# Patient Record
Sex: Female | Born: 1945
Health system: Southern US, Community
[De-identification: ages and names within clinical notes are randomized; demographics above are authoritative.]

## PROBLEM LIST (undated history)

## (undated) DIAGNOSIS — R011 Cardiac murmur, unspecified: Secondary | ICD-10-CM

## (undated) DIAGNOSIS — I10 Essential (primary) hypertension: Secondary | ICD-10-CM

## (undated) DIAGNOSIS — E785 Hyperlipidemia, unspecified: Secondary | ICD-10-CM

## (undated) DIAGNOSIS — Z9289 Personal history of other medical treatment: Secondary | ICD-10-CM

## (undated) DIAGNOSIS — M1990 Unspecified osteoarthritis, unspecified site: Secondary | ICD-10-CM

## (undated) DIAGNOSIS — J45909 Unspecified asthma, uncomplicated: Secondary | ICD-10-CM

## (undated) DIAGNOSIS — T7840XA Allergy, unspecified, initial encounter: Secondary | ICD-10-CM

## (undated) DIAGNOSIS — R17 Unspecified jaundice: Secondary | ICD-10-CM

## (undated) HISTORY — DX: Unspecified osteoarthritis, unspecified site: M19.90

## (undated) HISTORY — DX: Unspecified asthma, uncomplicated: J45.909

## (undated) HISTORY — PX: PARTIAL HYSTERECTOMY: SHX80

## (undated) HISTORY — PX: VAGINAL HYSTERECTOMY: SUR661

## (undated) HISTORY — DX: Cardiac murmur, unspecified: R01.1

## (undated) HISTORY — DX: Personal history of other medical treatment: Z92.89

## (undated) HISTORY — DX: Hyperlipidemia, unspecified: E78.5

## (undated) HISTORY — DX: Unspecified jaundice: R17

## (undated) HISTORY — DX: Allergy, unspecified, initial encounter: T78.40XA

## (undated) HISTORY — DX: Essential (primary) hypertension: I10

---

## 1978-10-30 HISTORY — PX: TUBAL LIGATION: SHX77

## 2004-11-28 ENCOUNTER — Ambulatory Visit: Payer: Self-pay | Admitting: Internal Medicine

## 2008-07-12 ENCOUNTER — Emergency Department: Payer: Self-pay | Admitting: Emergency Medicine

## 2008-07-20 ENCOUNTER — Ambulatory Visit: Payer: Self-pay | Admitting: Family Medicine

## 2010-06-24 LAB — HM COLONOSCOPY: HM Colonoscopy: NORMAL

## 2011-10-05 ENCOUNTER — Ambulatory Visit (INDEPENDENT_AMBULATORY_CARE_PROVIDER_SITE_OTHER): Payer: Self-pay | Admitting: Internal Medicine

## 2011-10-05 ENCOUNTER — Encounter: Payer: Self-pay | Admitting: Internal Medicine

## 2011-10-05 VITALS — BP 170/88 | HR 80 | Temp 98.4°F | Ht 60.0 in | Wt 128.0 lb

## 2011-10-05 DIAGNOSIS — E782 Mixed hyperlipidemia: Secondary | ICD-10-CM | POA: Insufficient documentation

## 2011-10-05 DIAGNOSIS — E7800 Pure hypercholesterolemia, unspecified: Secondary | ICD-10-CM | POA: Insufficient documentation

## 2011-10-05 DIAGNOSIS — M25519 Pain in unspecified shoulder: Secondary | ICD-10-CM

## 2011-10-05 DIAGNOSIS — Z23 Encounter for immunization: Secondary | ICD-10-CM

## 2011-10-05 DIAGNOSIS — M25512 Pain in left shoulder: Secondary | ICD-10-CM

## 2011-10-05 DIAGNOSIS — E785 Hyperlipidemia, unspecified: Secondary | ICD-10-CM

## 2011-10-05 DIAGNOSIS — Z0000 Encounter for general adult medical examination without abnormal findings: Secondary | ICD-10-CM

## 2011-10-05 DIAGNOSIS — Z113 Encounter for screening for infections with a predominantly sexual mode of transmission: Secondary | ICD-10-CM

## 2011-10-05 LAB — CBC WITH DIFFERENTIAL/PLATELET
Basophils Relative: 0.6 % (ref 0.0–3.0)
Eosinophils Absolute: 0.1 10*3/uL (ref 0.0–0.7)
Lymphocytes Relative: 29.2 % (ref 12.0–46.0)
MCHC: 34 g/dL (ref 30.0–36.0)
MCV: 87.3 fl (ref 78.0–100.0)
Monocytes Absolute: 0.5 10*3/uL (ref 0.1–1.0)
Neutrophils Relative %: 60.3 % (ref 43.0–77.0)
Platelets: 274 10*3/uL (ref 150.0–400.0)
RBC: 4.73 Mil/uL (ref 3.87–5.11)
WBC: 5.8 10*3/uL (ref 4.5–10.5)

## 2011-10-05 LAB — COMPREHENSIVE METABOLIC PANEL
ALT: 28 U/L (ref 0–35)
AST: 24 U/L (ref 0–37)
BUN: 26 mg/dL — ABNORMAL HIGH (ref 6–23)
Creatinine, Ser: 0.5 mg/dL (ref 0.4–1.2)
GFR: 149.18 mL/min (ref >60.00)
Total Bilirubin: 0.6 mg/dL (ref 0.3–1.2)

## 2011-10-05 LAB — LIPID PANEL
HDL: 90.7 mg/dL (ref >39.00)
Triglycerides: 56 mg/dL (ref 0.0–149.0)
VLDL: 11.2 mg/dL (ref 0.0–40.0)

## 2011-10-05 MED ORDER — MELOXICAM 15 MG PO TABS: 15.0000 mg | ORAL_TABLET | Freq: Every day | ORAL | Status: DC

## 2011-10-05 MED ORDER — ZOSTER VACCINE LIVE 19400 UNT/0.65ML ~~LOC~~ SOLR: 0.6500 mL | Freq: Once | SUBCUTANEOUS | Status: DC

## 2011-10-05 NOTE — Patient Instructions (Signed)
Prevent and Reverse Heart Disease by [place] 

## 2011-10-05 NOTE — Progress Notes (Signed)
Subjective:    Patient ID: [name], female    DOB: 1946-02-06, 65 y.o.   MRN: [id number]  HPI 65 year old female with a history of hyperlipidemia presents to establish care. Her primary concern today is left shoulder pain. She notes this has been present for several weeks. She describes the pain as aching and tightness in her left shoulder. The pain is made worse by rotating and extending her arm behind her back. She has taken Advil with minimal improvement in her pain. She denies any weakness in her left arm or numbness. 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  She also brings records from labs performed in 2007. This shows elevated liver function tests. She reports that she has had lab work repeated since that time and her lipids have been persistently high. She has not taken medication for this. She tries to follow a healthy diet.  Outpatient Encounter Prescriptions as of 10/05/2011  Medication Sig Dispense Refill  . calcium carbonate (OS-CAL - DOSED IN MG OF ELEMENTAL CALCIUM) 1250 MG tablet Take 1 tablet by mouth daily.        . fexofenadine-pseudoephedrine (ALLEGRA-D 24) 180-240 MG per 24 hr tablet Take 1 tablet by mouth daily.        . fluticasone (FLONASE) 50 MCG/ACT nasal spray Place 2 sprays into the nose daily.          Review of Systems  Constitutional: Negative for fever, chills, appetite change, fatigue and unexpected weight change.  HENT: Negative for ear pain, congestion, neck pain and sinus pressure.   Eyes: Negative for visual disturbance.  Respiratory: Negative for cough, shortness of breath, wheezing and stridor.   Cardiovascular: Negative for chest pain, palpitations and leg swelling.  Gastrointestinal: Negative for nausea, abdominal pain, diarrhea, constipation and abdominal distention.  Genitourinary: Negative for dysuria and flank pain.  Musculoskeletal: Positive for arthralgias. Negative for myalgias and gait problem.  Skin: Negative for color change and  rash.  Neurological: Negative for dizziness, weakness, numbness and headaches.  Hematological: Negative for adenopathy. Does not bruise/bleed easily.  Psychiatric/Behavioral: Negative for suicidal ideas, sleep disturbance and dysphoric mood. The patient is not nervous/anxious.    BP 170/88  Pulse 80  Temp(Src) 98.4 F (36.9 C) (Oral)  Ht 5' (1.524 m)  Wt 128 lb (58.06 kg)  BMI 25.00 kg/m2  SpO2 97% Repeat BP 150/78    Objective:   Physical Exam  Constitutional: She is oriented to person, place, and time. She appears well-developed and well-nourished. No distress.  HENT:  Head: Normocephalic and atraumatic.  Right Ear: External ear normal.  Left Ear: External ear normal.  Nose: Nose normal.  Mouth/Throat: Oropharynx is clear and moist. No oropharyngeal exudate.  Eyes: Conjunctivae are normal. Pupils are equal, round, and reactive to light. Right eye exhibits no discharge. Left eye exhibits no discharge. No scleral icterus.  Neck: Normal range of motion. Neck supple. No tracheal deviation present. No thyromegaly present.  Cardiovascular: Normal rate, regular rhythm, normal heart sounds and intact distal pulses.  Exam reveals no gallop and no friction rub.   No murmur heard. Pulmonary/Chest: Effort normal and breath sounds normal. No respiratory distress. She has no wheezes. She has no rales. She exhibits no tenderness.  Musculoskeletal: She exhibits no edema and no tenderness.       Left shoulder: She exhibits decreased range of motion, tenderness and pain. She exhibits no deformity and normal strength.  Lymphadenopathy:    She has no cervical adenopathy.  Neurological: She  is alert and oriented to person, place, and time. No cranial nerve deficit. She exhibits normal muscle tone. Coordination normal.  Skin: Skin is warm and dry. No rash noted. She is not diaphoretic. No erythema. No pallor.  Psychiatric: She has a normal mood and affect. Her behavior is normal. Judgment and thought  content normal.          Assessment & Plan:  1. Left shoulder pain - Suspect adhesive capsulitis versus arthritis.  Will get plain film of left shoulder. Will start meloxicam. Follow up 1 month or sooner based on xray report.  2. Hyperlipidemia - Noted on previous labs. Not currently on medication. Will check lipids with labs today. Discussed diet high in fiber, low in saturated fat.  3. Hypertension - Noted on past exam per pt. Will check renal function with labs today. Follow up in 1 month for BP recheck. If persistently elevated, will start medication.  4. Health maintenance - Will give flu shot today. Tdap at next visit as not available today. Labs including CBC, CMP lipids today.

## 2011-10-06 LAB — HEPATITIS C ANTIBODY: HCV Ab: NEGATIVE

## 2011-10-09 ENCOUNTER — Telehealth: Payer: Self-pay | Admitting: *Deleted

## 2011-10-09 ENCOUNTER — Ambulatory Visit (INDEPENDENT_AMBULATORY_CARE_PROVIDER_SITE_OTHER)
Admission: RE | Admit: 2011-10-09 | Discharge: 2011-10-09 | Disposition: A | Discharge status: Home / Self Care | Payer: Self-pay | Source: Ambulatory Visit | Attending: Internal Medicine | Admitting: Internal Medicine

## 2011-10-09 DIAGNOSIS — M25512 Pain in left shoulder: Secondary | ICD-10-CM

## 2011-10-09 DIAGNOSIS — M25519 Pain in unspecified shoulder: Secondary | ICD-10-CM

## 2011-10-09 MED ORDER — ATORVASTATIN CALCIUM 20 MG PO TABS: 20.0000 mg | ORAL_TABLET | Freq: Every day | ORAL | Status: DC

## 2011-10-09 NOTE — Telephone Encounter (Signed)
Message copied by Vernie Murders on Mon Oct 09, 2011  9:13 AM ------      Message from: Ronna Polio A      Created: Thu Oct 05, 2011  5:30 PM       Labs show that cholesterol is high. Would recommend starting Lipitor 20mg  daily.  Repeat LFTS and lipids in 1 month.

## 2011-10-16 ENCOUNTER — Other Ambulatory Visit: Payer: Self-pay | Admitting: *Deleted

## 2011-10-16 MED ORDER — ZOSTER VACCINE LIVE 19400 UNT/0.65ML ~~LOC~~ SOLR: 0.6500 mL | Freq: Once | SUBCUTANEOUS | Status: AC

## 2011-11-13 ENCOUNTER — Encounter: Payer: Self-pay | Admitting: Internal Medicine

## 2011-11-13 ENCOUNTER — Ambulatory Visit (INDEPENDENT_AMBULATORY_CARE_PROVIDER_SITE_OTHER): Payer: Self-pay | Admitting: Internal Medicine

## 2011-11-13 ENCOUNTER — Other Ambulatory Visit: Payer: Self-pay

## 2011-11-13 VITALS — BP 156/86 | HR 71 | Temp 98.2°F | Wt 130.0 lb

## 2011-11-13 DIAGNOSIS — M25519 Pain in unspecified shoulder: Secondary | ICD-10-CM

## 2011-11-13 DIAGNOSIS — E785 Hyperlipidemia, unspecified: Secondary | ICD-10-CM

## 2011-11-13 DIAGNOSIS — G2581 Restless legs syndrome: Secondary | ICD-10-CM | POA: Insufficient documentation

## 2011-11-13 DIAGNOSIS — M25512 Pain in left shoulder: Secondary | ICD-10-CM

## 2011-11-13 LAB — COMPREHENSIVE METABOLIC PANEL
AST: 30 U/L (ref 0–37)
Albumin: 4.4 g/dL (ref 3.5–5.2)
Alkaline Phosphatase: 47 U/L (ref 39–117)
BUN: 17 mg/dL (ref 6–23)
Calcium: 8.9 mg/dL (ref 8.4–10.5)
Chloride: 100 mEq/L (ref 96–112)
Glucose, Bld: 116 mg/dL — ABNORMAL HIGH (ref 70–99)
Potassium: 3.6 mEq/L (ref 3.5–5.1)
Sodium: 135 mEq/L (ref 135–145)
Total Protein: 6.8 g/dL (ref 6.0–8.3)

## 2011-11-13 LAB — LIPID PANEL
LDL Cholesterol: 86 mg/dL (ref 0–99)
Total CHOL/HDL Ratio: 2
Triglycerides: 26 mg/dL (ref 0.0–149.0)

## 2011-11-13 MED ORDER — ROPINIROLE HCL 1 MG PO TABS: 1.0000 mg | ORAL_TABLET | Freq: Every day | ORAL | Status: DC

## 2011-11-13 NOTE — Progress Notes (Signed)
Subjective:    Patient ID: [name], female    DOB: January 26, 1946, 66 y.o.   MRN: [id number]  HPI 66YO female presents for follow up. Recently noted to have elevated lipids on labs. Started on Atorvastatin. No side effects of medication noted.  Has made changes to diet, limiting intake of meats and saturated fat, increasing fiber.    Also notes that left shoulder pain has improved significantly since last visit.  No weakness left arm, or with shoulder movement. No pain at rest. Pain described as aching only with extension of left arm posteriorly.Continues to use meloxicam on occasion with improvement.  Outpatient Encounter Prescriptions as of 11/13/2011  Medication Sig Dispense Refill  . atorvastatin (LIPITOR) 20 MG tablet Take 1 tablet (20 mg total) by mouth daily.  30 tablet  2  . fexofenadine-pseudoephedrine (ALLEGRA-D 24) 180-240 MG per 24 hr tablet Take 1 tablet by mouth daily.        . fluticasone (FLONASE) 50 MCG/ACT nasal spray Place 2 sprays into the nose daily.        . meloxicam (MOBIC) 15 MG tablet Take 1 tablet (15 mg total) by mouth daily.  30 tablet  2  . rOPINIRole (REQUIP) 1 MG tablet Take 1 tablet (1 mg total) by mouth at bedtime.  30 tablet  3  . DISCONTD: rOPINIRole (REQUIP) 1 MG tablet         Review of Systems  Constitutional: Negative for fever, chills, appetite change, fatigue and unexpected weight change.  HENT: Negative for ear pain, congestion, sore throat, trouble swallowing, neck pain, voice change and sinus pressure.   Eyes: Negative for visual disturbance.  Respiratory: Negative for cough, shortness of breath, wheezing and stridor.   Cardiovascular: Negative for chest pain, palpitations and leg swelling.  Gastrointestinal: Negative for nausea, vomiting, abdominal pain, diarrhea, constipation, blood in stool, abdominal distention and anal bleeding.  Genitourinary: Negative for dysuria and flank pain.  Musculoskeletal: Positive for arthralgias (left shoulder  - improved). Negative for myalgias and gait problem.  Skin: Negative for color change and rash.  Neurological: Negative for dizziness and headaches.  Hematological: Negative for adenopathy. Does not bruise/bleed easily.  Psychiatric/Behavioral: Negative for suicidal ideas, sleep disturbance and dysphoric mood. The patient is not nervous/anxious.    BP 156/86  Pulse 71  Temp(Src) 98.2 F (36.8 C) (Oral)  Wt 130 lb (58.968 kg)  SpO2 98%     Objective:   Physical Exam  Constitutional: She is oriented to person, place, and time. She appears well-developed and well-nourished. No distress.  HENT:  Head: Normocephalic and atraumatic.  Right Ear: External ear normal.  Left Ear: External ear normal.  Nose: Nose normal.  Mouth/Throat: Oropharynx is clear and moist. No oropharyngeal exudate.  Eyes: Conjunctivae are normal. Pupils are equal, round, and reactive to light. Right eye exhibits no discharge. Left eye exhibits no discharge. No scleral icterus.  Neck: Normal range of motion. Neck supple. No tracheal deviation present. No thyromegaly present.  Cardiovascular: Normal rate, regular rhythm, normal heart sounds and intact distal pulses.  Exam reveals no gallop and no friction rub.   No murmur heard. Pulmonary/Chest: Effort normal and breath sounds normal. No respiratory distress. She has no wheezes. She has no rales. She exhibits no tenderness.  Musculoskeletal: Normal range of motion. She exhibits no edema and no tenderness.  Lymphadenopathy:    She has no cervical adenopathy.  Neurological: She is alert and oriented to person, place, and time. No cranial nerve deficit. She  exhibits normal muscle tone. Coordination normal.  Skin: Skin is warm and dry. No rash noted. She is not diaphoretic. No erythema. No pallor.  Psychiatric: She has a normal mood and affect. Her behavior is normal. Judgment and thought content normal.          Assessment & Plan:  1. Hyperlipidemia - Started  Lipitor. No noted side effects. Will recheck LFTs and lipids today. Follow up 6 months.  2. Left shoulder pain - Improved. Using meloxicam rarely. Will continue to monitor. If symptoms do not resolve, will set up orthopedic evaluation.  3. Elevated BP without HTN - Pt BP elevated today. Has not been elevated at home, but was elevated last check here. Will monitor at home and pt will email with BP readings. If persistently >140/90, will add medication.

## 2011-12-11 ENCOUNTER — Ambulatory Visit: Payer: Self-pay | Admitting: Internal Medicine

## 2012-01-04 ENCOUNTER — Encounter: Payer: Self-pay | Admitting: Internal Medicine

## 2012-01-12 ENCOUNTER — Other Ambulatory Visit: Payer: Self-pay | Admitting: *Deleted

## 2012-01-12 MED ORDER — ATORVASTATIN CALCIUM 20 MG PO TABS: 20.0000 mg | ORAL_TABLET | Freq: Every day | ORAL | Status: DC

## 2012-05-20 ENCOUNTER — Ambulatory Visit: Payer: Self-pay | Admitting: Internal Medicine

## 2012-05-30 ENCOUNTER — Ambulatory Visit: Payer: Self-pay | Admitting: Internal Medicine

## 2012-06-24 ENCOUNTER — Ambulatory Visit (INDEPENDENT_AMBULATORY_CARE_PROVIDER_SITE_OTHER): Payer: 59 | Admitting: Internal Medicine

## 2012-06-24 ENCOUNTER — Encounter: Payer: Self-pay | Admitting: Internal Medicine

## 2012-06-24 ENCOUNTER — Other Ambulatory Visit: Payer: Self-pay | Admitting: Internal Medicine

## 2012-06-24 VITALS — BP 140/72 | HR 76 | Temp 98.2°F | Ht 60.0 in | Wt 131.5 lb

## 2012-06-24 DIAGNOSIS — E785 Hyperlipidemia, unspecified: Secondary | ICD-10-CM

## 2012-06-24 DIAGNOSIS — G2581 Restless legs syndrome: Secondary | ICD-10-CM

## 2012-06-24 DIAGNOSIS — J309 Allergic rhinitis, unspecified: Secondary | ICD-10-CM | POA: Insufficient documentation

## 2012-06-24 DIAGNOSIS — Z23 Encounter for immunization: Secondary | ICD-10-CM

## 2012-06-24 MED ORDER — FEXOFENADINE-PSEUDOEPHED ER 180-240 MG PO TB24: 1.0000 | ORAL_TABLET | Freq: Every day | ORAL | Status: DC

## 2012-06-24 MED ORDER — ROPINIROLE HCL 1 MG PO TABS: 1.0000 mg | ORAL_TABLET | Freq: Every day | ORAL | Status: DC

## 2012-06-24 NOTE — Progress Notes (Signed)
Subjective:    Patient ID: [name], female    DOB: Mar 17, 1946, 66 y.o.   MRN: [id number]  HPI 66 year old female with history of hyperlipidemia, allergic rhinitis, and restless legs presents for followup. She reports she is generally feeling well. Over the last few weeks she has noticed some increased nasal congestion despite use of Allegra-D. She questions whether she might need to followup with her ENT physician. She questions whether she may need allergy testing. She has not had any fever, chills, shortness of breath, cough. Next  In regards to hyperlipidemia, she reports she has only intermittently been taking her Lipitor. She does try to follow a healthy diet and exercise. She was concerned about the use of medications because she has a friend who was recently diagnosed with pancreatic cancer. She has not had side effects from statin medicine such as myalgia. Next  In regards to restless legs, she reports that she only intermittently uses Requip. She reports that symptoms are well controlled with this medication.  Outpatient Encounter Prescriptions as of 06/24/2012  Medication Sig Dispense Refill  . atorvastatin (LIPITOR) 20 MG tablet Take 1 tablet (20 mg total) by mouth daily.  90 tablet  1  . calcium carbonate (OS-CAL - DOSED IN MG OF ELEMENTAL CALCIUM) 1250 MG tablet Take 1 tablet by mouth daily.        . fexofenadine-pseudoephedrine (ALLEGRA-D 24) 180-240 MG per 24 hr tablet Take 1 tablet by mouth daily.  30 tablet  6  . fluticasone (FLONASE) 50 MCG/ACT nasal spray Place 2 sprays into the nose daily.        Marland Kitchen rOPINIRole (REQUIP) 1 MG tablet Take 1 tablet (1 mg total) by mouth at bedtime.  30 tablet  6    Review of Systems  Constitutional: Negative for fever, chills, appetite change, fatigue and unexpected weight change.  HENT: Positive for congestion. Negative for ear pain, sore throat, trouble swallowing, neck pain, voice change and sinus pressure.   Eyes: Negative for visual  disturbance.  Respiratory: Negative for cough, shortness of breath, wheezing and stridor.   Cardiovascular: Negative for chest pain, palpitations and leg swelling.  Gastrointestinal: Negative for nausea, vomiting, abdominal pain, diarrhea, constipation, blood in stool, abdominal distention and anal bleeding.  Genitourinary: Negative for dysuria and flank pain.  Musculoskeletal: Negative for myalgias, arthralgias and gait problem.  Skin: Negative for color change and rash.  Neurological: Negative for dizziness and headaches.  Hematological: Negative for adenopathy. Does not bruise/bleed easily.  Psychiatric/Behavioral: Negative for suicidal ideas, disturbed wake/sleep cycle and dysphoric mood. The patient is not nervous/anxious.    BP 150/88  Pulse 76  Temp 98.2 F (36.8 C) (Oral)  Ht 5' (1.524 m)  Wt 131 lb 8 oz (59.648 kg)  BMI 25.68 kg/m2  SpO2 98%     Objective:   Physical Exam  Constitutional: She is oriented to person, place, and time. She appears well-developed and well-nourished. No distress.  HENT:  Head: Normocephalic and atraumatic.  Right Ear: External ear normal.  Left Ear: External ear normal.  Nose: Nose normal.  Mouth/Throat: Oropharynx is clear and moist. No oropharyngeal exudate.  Eyes: Conjunctivae are normal. Pupils are equal, round, and reactive to light. Right eye exhibits no discharge. Left eye exhibits no discharge. No scleral icterus.  Neck: Normal range of motion. Neck supple. No tracheal deviation present. No thyromegaly present.  Cardiovascular: Normal rate, regular rhythm, normal heart sounds and intact distal pulses.  Exam reveals no gallop and no friction rub.  No murmur heard. Pulmonary/Chest: Effort normal and breath sounds normal. No respiratory distress. She has no wheezes. She has no rales. She exhibits no tenderness.  Musculoskeletal: Normal range of motion. She exhibits no edema and no tenderness.  Lymphadenopathy:    She has no cervical  adenopathy.  Neurological: She is alert and oriented to person, place, and time. No cranial nerve deficit. She exhibits normal muscle tone. Coordination normal.  Skin: Skin is warm and dry. No rash noted. She is not diaphoretic. No erythema. No pallor.  Psychiatric: She has a normal mood and affect. Her behavior is normal. Judgment and thought content normal.          Assessment & Plan:

## 2012-06-24 NOTE — Assessment & Plan Note (Signed)
Symptoms recently is well controlled on Allegra-D and Flonase. No evidence of acute sinusitis. Encouraged her to keep followup with allergist. Suspect she might benefit from allergy testing.

## 2012-06-24 NOTE — Assessment & Plan Note (Signed)
Symptoms well controlled with Requip. Will continue. Follow up 6 months.

## 2012-06-24 NOTE — Assessment & Plan Note (Signed)
Encouraged compliance with medication. Will check lipids and LFTs with labs today. Follow up 6 months.

## 2012-06-26 LAB — COMPREHENSIVE METABOLIC PANEL
Albumin/Globulin Ratio: 3.3 — ABNORMAL HIGH (ref 1.1–2.5)
Albumin: 5 g/dL — ABNORMAL HIGH (ref 3.6–4.8)
BUN: 22 mg/dL (ref 8–27)
Calcium: 9.3 mg/dL (ref 8.6–10.2)
Creatinine, Ser: 0.49 mg/dL — ABNORMAL LOW (ref 0.57–1.00)
GFR calc non Af Amer: 103 mL/min/{1.73_m2} (ref >59)
Globulin, Total: 1.5 g/dL (ref 1.5–4.5)
Glucose: 88 mg/dL (ref 65–99)
Total Protein: 6.5 g/dL (ref 6.0–8.5)

## 2012-06-26 LAB — LIPID PANEL W/O CHOL/HDL RATIO
HDL: 93 mg/dL (ref >39)
VLDL Cholesterol Cal: 10 mg/dL (ref 5–40)

## 2012-10-26 ENCOUNTER — Other Ambulatory Visit: Payer: Self-pay | Admitting: Internal Medicine

## 2012-12-14 ENCOUNTER — Other Ambulatory Visit: Payer: Self-pay

## 2012-12-23 ENCOUNTER — Encounter: Payer: 59 | Admitting: Internal Medicine

## 2013-01-01 ENCOUNTER — Encounter: Payer: 59 | Admitting: Internal Medicine

## 2013-02-24 ENCOUNTER — Ambulatory Visit (INDEPENDENT_AMBULATORY_CARE_PROVIDER_SITE_OTHER): Payer: 59 | Admitting: Internal Medicine

## 2013-02-24 ENCOUNTER — Encounter: Payer: Self-pay | Admitting: Internal Medicine

## 2013-02-24 ENCOUNTER — Other Ambulatory Visit (HOSPITAL_COMMUNITY)
Admission: RE | Admit: 2013-02-24 | Discharge: 2013-02-24 | Disposition: A | Discharge status: Home / Self Care | Payer: 59 | Source: Ambulatory Visit | Attending: Internal Medicine | Admitting: Internal Medicine

## 2013-02-24 VITALS — BP 159/97 | HR 74 | Temp 98.7°F | Ht 59.5 in | Wt 131.0 lb

## 2013-02-24 DIAGNOSIS — M21619 Bunion of unspecified foot: Secondary | ICD-10-CM

## 2013-02-24 DIAGNOSIS — Z01419 Encounter for gynecological examination (general) (routine) without abnormal findings: Secondary | ICD-10-CM | POA: Insufficient documentation

## 2013-02-24 DIAGNOSIS — M21611 Bunion of right foot: Secondary | ICD-10-CM | POA: Insufficient documentation

## 2013-02-24 DIAGNOSIS — IMO0001 Reserved for inherently not codable concepts without codable children: Secondary | ICD-10-CM | POA: Insufficient documentation

## 2013-02-24 DIAGNOSIS — R030 Elevated blood-pressure reading, without diagnosis of hypertension: Secondary | ICD-10-CM

## 2013-02-24 DIAGNOSIS — E785 Hyperlipidemia, unspecified: Secondary | ICD-10-CM

## 2013-02-24 DIAGNOSIS — J309 Allergic rhinitis, unspecified: Secondary | ICD-10-CM

## 2013-02-24 DIAGNOSIS — Z0000 Encounter for general adult medical examination without abnormal findings: Secondary | ICD-10-CM | POA: Insufficient documentation

## 2013-02-24 DIAGNOSIS — Z23 Encounter for immunization: Secondary | ICD-10-CM

## 2013-02-24 DIAGNOSIS — M21612 Bunion of left foot: Secondary | ICD-10-CM

## 2013-02-24 DIAGNOSIS — Z1151 Encounter for screening for human papillomavirus (HPV): Secondary | ICD-10-CM | POA: Insufficient documentation

## 2013-02-24 DIAGNOSIS — H04123 Dry eye syndrome of bilateral lacrimal glands: Secondary | ICD-10-CM

## 2013-02-24 DIAGNOSIS — R9431 Abnormal electrocardiogram [ECG] [EKG]: Secondary | ICD-10-CM | POA: Insufficient documentation

## 2013-02-24 DIAGNOSIS — H04129 Dry eye syndrome of unspecified lacrimal gland: Secondary | ICD-10-CM | POA: Insufficient documentation

## 2013-02-24 LAB — COMPREHENSIVE METABOLIC PANEL
Albumin: 4.3 g/dL (ref 3.5–5.2)
Alkaline Phosphatase: 45 U/L (ref 39–117)
CO2: 31 mEq/L (ref 19–32)
Chloride: 101 mEq/L (ref 96–112)
GFR: 128.56 mL/min (ref >60.00)
Glucose, Bld: 95 mg/dL (ref 70–99)
Potassium: 4.4 mEq/L (ref 3.5–5.1)
Sodium: 137 mEq/L (ref 135–145)
Total Protein: 6.8 g/dL (ref 6.0–8.3)

## 2013-02-24 LAB — CBC WITH DIFFERENTIAL/PLATELET
Eosinophils Relative: 1.1 % (ref 0.0–5.0)
MCV: 86.3 fl (ref 78.0–100.0)
Monocytes Absolute: 0.4 10*3/uL (ref 0.1–1.0)
Neutrophils Relative %: 69.3 % (ref 43.0–77.0)
Platelets: 259 10*3/uL (ref 150.0–400.0)
WBC: 5.9 10*3/uL (ref 4.5–10.5)

## 2013-02-24 LAB — LIPID PANEL
Cholesterol: 242 mg/dL — ABNORMAL HIGH (ref 0–200)
Total CHOL/HDL Ratio: 3
Triglycerides: 42 mg/dL (ref 0.0–149.0)

## 2013-02-24 MED ORDER — ZOSTER VACCINE LIVE 19400 UNT/0.65ML ~~LOC~~ SOLR: 0.6500 mL | Freq: Once | SUBCUTANEOUS | Status: DC

## 2013-02-24 NOTE — Assessment & Plan Note (Signed)
Encouraged her to limit use of Sudafed given that it may raise her blood pressure. Continue Allegra.

## 2013-02-24 NOTE — Assessment & Plan Note (Signed)
Non-specific findings on EKG. Asymptomatic. Strong family hx of heart disease in both parents at young age. Risk factors hyperlipidemia, sedentary lifestyle. Will set up cardiology evaluation. Question if stress test might be helpful for risk stratification.

## 2013-02-24 NOTE — Assessment & Plan Note (Signed)
BP slightly elevated today. Likely related to use of Sudafed. Encouraged her to avoid this medication. Will monitor BP at home and call if BP persistently >150/90.

## 2013-02-24 NOTE — Assessment & Plan Note (Signed)
Question if she might benefit from restasis for chronic dry eyes. Encouraged her to follow up with her ophthalmologist about this.

## 2013-02-24 NOTE — Progress Notes (Signed)
Subjective:    Patient ID: [name], female    DOB: [birth year], 67 y.o.   MRN: [id number]  HPI The patient is here for annual Medicare wellness examination and management of other chronic and acute problems.   The risk factors are reflected in the social history.  The roster of all physicians providing medical care to patient - is listed in the Snapshot section of the chart.  Activities of daily living:  The patient is 100% independent in all ADLs: dressing, toileting, feeding as well as independent mobility  Home safety : The patient has smoke detectors in the home. They wear seatbelts.  There are no firearms at home. There is no violence in the home.   There is no risks for hepatitis, STDs or HIV. There is no history of blood transfusion. They have no travel history to infectious disease endemic areas of the world.  The patient has seen their dentist in the last six month. (Dr. Ashley Royalty) They have seen their eye doctor in the last year. [facility]) No issues with hearing.  They have deferred audiologic testing in the last year.   They do not  have excessive sun exposure. Discussed the need for sun protection: hats, long sleeves and use of sunscreen if there is significant sun exposure. (Dr. Gwen Pounds)  Diet: the importance of a healthy diet is discussed. They do have a healthy diet.  The benefits of regular aerobic exercise were discussed. She does not exercise, but very active at work.  Depression screen: there are no signs or vegative symptoms of depression- irritability, change in appetite, anhedonia, sadness/tearfullness.  Cognitive assessment: the patient manages all their financial and personal affairs and is actively engaged. They could relate day,date,year and events.  HCPOA - husband, then son  The following portions of the patient's history were reviewed and updated as appropriate: allergies, current medications, past family history, past medical history,   past surgical history, past social history  and problem list.  Visual acuity was not assessed per patient preference since she has regular follow up with her ophthalmologist. Hearing and body mass index were assessed and reviewed.   During the course of the visit the patient was educated and counseled about appropriate screening and preventive services including : fall prevention , diabetes screening, nutrition counseling, colorectal cancer screening, and recommended immunizations.    Patient is also concerned today about several month history of clear drainage from her eyes. She was seen in the past by her ophthalmologist for this and he recommended repeat voiding drops for suspected dry eyes. She reports no improvement with this. She denies any purulent drainage, eye pain, visual changes. Symptoms occasionally improved with use of antihistamines.  She is also concerned about bunion on her right foot. This is been present for years. It is occasionally painful after prolonged standing. It is improved by wearing supportive shoes. She questions if any other interventions might be helpful aside from surgery.  Outpatient Encounter Prescriptions as of 02/24/2013  Medication Sig Dispense Refill  . fexofenadine-pseudoephedrine (ALLEGRA-D 24) 180-240 MG per 24 hr tablet Take 1 tablet by mouth daily.  30 tablet  6  . fluticasone (FLONASE) 50 MCG/ACT nasal spray Place 2 sprays into the nose daily.        Marland Kitchen rOPINIRole (REQUIP) 1 MG tablet TAKE 1 TABLET BY MOUTH AT BEDTIME  30 tablet  6  . atorvastatin (LIPITOR) 20 MG tablet Take 1 tablet (20 mg total) by mouth daily.  90 tablet  1  . diazepam (VALIUM) 5 MG tablet Take 5 mg by mouth.       . zoster vaccine live, PF, (ZOSTAVAX) 16109 UNT/0.65ML injection Inject 19,400 Units into the skin once.  1 each  0  . [DISCONTINUED] calcium carbonate (OS-CAL - DOSED IN MG OF ELEMENTAL CALCIUM) 1250 MG tablet Take 1 tablet by mouth daily.         No facility-administered  encounter medications on file as of 02/24/2013.   BP 159/97  Pulse 74  Temp(Src) 98.7 F (37.1 C) (Oral)  Ht 4' 11.5" (1.511 m)  Wt 131 lb (59.421 kg)  BMI 26.03 kg/m2  SpO2 97%   Review of Systems  Constitutional: Negative for fever, chills, appetite change, fatigue and unexpected weight change.  HENT: Negative for ear pain, congestion, sore throat, trouble swallowing, neck pain, voice change and sinus pressure.   Eyes: Positive for discharge. Negative for visual disturbance.  Respiratory: Negative for cough, shortness of breath, wheezing and stridor.   Cardiovascular: Negative for chest pain, palpitations and leg swelling.  Gastrointestinal: Negative for nausea, vomiting, abdominal pain, diarrhea, constipation, blood in stool, abdominal distention and anal bleeding.  Genitourinary: Negative for dysuria and flank pain.  Musculoskeletal: Negative for myalgias, arthralgias and gait problem.  Skin: Negative for color change and rash.  Neurological: Negative for dizziness and headaches.  Hematological: Negative for adenopathy. Does not bruise/bleed easily.  Psychiatric/Behavioral: Negative for suicidal ideas, sleep disturbance and dysphoric mood. The patient is not nervous/anxious.        Objective:   Physical Exam  Constitutional: She is oriented to person, place, and time. She appears well-developed and well-nourished. No distress.  HENT:  Head: Normocephalic and atraumatic.  Right Ear: External ear normal.  Left Ear: External ear normal.  Nose: Nose normal.  Mouth/Throat: Oropharynx is clear and moist. No oropharyngeal exudate.  Eyes: Conjunctivae are normal. Pupils are equal, round, and reactive to light. Right eye exhibits no discharge. Left eye exhibits no discharge. No scleral icterus.  Neck: Normal range of motion. Neck supple. No tracheal deviation present. No thyromegaly present.  Cardiovascular: Normal rate, regular rhythm, normal heart sounds and intact distal pulses.   Exam reveals no gallop and no friction rub.   No murmur heard. Pulmonary/Chest: Effort normal and breath sounds normal. No accessory muscle usage. Not tachypneic. No respiratory distress. She has no decreased breath sounds. She has no wheezes. She has no rhonchi. She has no rales. She exhibits no tenderness.  Abdominal: Soft. Bowel sounds are normal. She exhibits no distension and no mass. There is no tenderness. There is no rebound and no guarding.  Genitourinary: Rectum normal, vagina normal and uterus normal. No breast swelling, tenderness, discharge or bleeding. Pelvic exam was performed with patient supine. There is no rash, tenderness or lesion on the right labia. There is no rash, tenderness or lesion on the left labia. Uterus is not enlarged and not tender. Cervix exhibits no motion tenderness, no discharge and no friability. Right adnexum displays no mass, no tenderness and no fullness. Left adnexum displays no mass, no tenderness and no fullness. No erythema or tenderness around the vagina. No vaginal discharge found.  Musculoskeletal: Normal range of motion. She exhibits no edema and no tenderness.  Lymphadenopathy:    She has no cervical adenopathy.  Neurological: She is alert and oriented to person, place, and time. No cranial nerve deficit. She exhibits normal muscle tone. Coordination normal.  Skin: Skin is warm and dry. No rash noted. She  is not diaphoretic. No erythema. No pallor.  Psychiatric: She has a normal mood and affect. Her behavior is normal. Judgment and thought content normal.          Assessment & Plan:

## 2013-02-24 NOTE — Assessment & Plan Note (Signed)
General medical exam including breast and pelvic exam normal today. Pap is pending. Mammogram scheduled. Colonoscopy is up-to-date. Appropriate screening performed. Labs today including CMP, lipids, CBC. Encouraged healthy diet and regular exercise. Follow up 6 months and prn.

## 2013-02-24 NOTE — Assessment & Plan Note (Signed)
Encouraged continued use of supportive shoes. Discussed referral to podiatry. Pt would like to hold off for now.

## 2013-02-24 NOTE — Assessment & Plan Note (Signed)
Patient stopped taking her atorvastatin. She would like to control cholesterol through diet. Discussed a Mediterranean style diet and recommended at least 40 minutes of exercise 3 times per week. Will repeat lipid profile with labs today.

## 2013-02-26 LAB — HM PAP SMEAR: HM PAP: NEGATIVE

## 2013-03-10 ENCOUNTER — Telehealth: Payer: Self-pay | Admitting: Internal Medicine

## 2013-03-10 NOTE — Telephone Encounter (Signed)
Mammogram from 03/10/2013 report states they need to get additional views because of dense breast tissue. Has this been scheduled?

## 2013-03-11 NOTE — Telephone Encounter (Signed)
Left message to call back  

## 2013-03-12 NOTE — Telephone Encounter (Signed)
Patient called back and she has already been scheduled, she is there right now

## 2013-03-21 ENCOUNTER — Encounter: Payer: Self-pay | Admitting: Cardiovascular Disease

## 2013-03-21 ENCOUNTER — Ambulatory Visit (INDEPENDENT_AMBULATORY_CARE_PROVIDER_SITE_OTHER): Payer: 59 | Admitting: Cardiovascular Disease

## 2013-03-21 VITALS — BP 172/103 | HR 81 | Ht 60.0 in | Wt 129.0 lb

## 2013-03-21 DIAGNOSIS — E785 Hyperlipidemia, unspecified: Secondary | ICD-10-CM

## 2013-03-21 DIAGNOSIS — R0600 Dyspnea, unspecified: Secondary | ICD-10-CM | POA: Insufficient documentation

## 2013-03-21 DIAGNOSIS — I10 Essential (primary) hypertension: Secondary | ICD-10-CM

## 2013-03-21 DIAGNOSIS — R000 Tachycardia, unspecified: Secondary | ICD-10-CM

## 2013-03-21 MED ORDER — AMLODIPINE BESYLATE 5 MG PO TABS: 5.0000 mg | ORAL_TABLET | Freq: Every day | ORAL | Status: DC

## 2013-03-21 NOTE — Progress Notes (Signed)
HPI  [name] who was referred by Dr. Dan Schaefer for an abnormal ECG and family history of premature coronary artery disease. The patient has no previous cardiac history. She has no history of diabetes. She does have history of mild hyperlipidemia and was started on atorvastatin. However, she has not been taking this medication due to fear of side effects. She also has been noted on more than one occasion to be hypertensive but is not on treatment. She denies any chest discomfort. She does not exercise on a regular basis. She does get mild shortness of breath if he overexerts himself. There is no dizziness, palpitations or syncope. She is a lifelong nonsmoker. There is a strong family history of premature coronary artery disease.  Allergies  Allergen Reactions  . Codeine Nausea Only  . Penicillins Swelling and Rash     Current Outpatient Prescriptions on File Prior to Visit  Medication Sig Dispense Refill  . fexofenadine-pseudoephedrine (ALLEGRA-D 24) 180-240 MG per 24 hr tablet Take 1 tablet by mouth daily.  30 tablet  6  . fluticasone (FLONASE) 50 MCG/ACT nasal spray Place 2 sprays into the nose daily.        Marland Kitchen rOPINIRole (REQUIP) 1 MG tablet TAKE 1 TABLET BY MOUTH AT BEDTIME  30 tablet  6  . zoster vaccine live, PF, (ZOSTAVAX) 16109 UNT/0.65ML injection Inject 19,400 Units into the skin once.  1 each  0   No current facility-administered medications on file prior to visit.     Past Medical History  Diagnosis Date  . Jaundice     In High school  . H/O exercise stress test     at 82 years age  . Hyperlipidemia   . Heart murmur   . Asthma   . Jaundice     hx   . Hypertension      Past Surgical History  Procedure Laterality Date  . Tubal ligation  1980     Family History  Problem Relation Age of Onset  . Heart attack Mother   . Heart disease Mother 66  . Heart attack Father   . Heart disease Father 97  . Diabetes Sister   . Diabetes  Maternal Grandmother   . Colon cancer Neg Hx   . Breast cancer Other   . Heart disease Brother      History   Social History  . Marital Status: Married    Spouse Name: N/A    Number of Children: 3  . Years of Education: N/A   Occupational History  . Self Employed - Hairdresser    Social History Main Topics  . Smoking status: Never Smoker   . Smokeless tobacco: Never Used  . Alcohol Use: Yes     Comment: Wine nightly  . Drug Use: No  . Sexually Active: Not on file   Other Topics Concern  . Not on file   Social History Narrative   Lives in [place]. Works at [employer] in [employer]      Daily Caffeine Use:  2 coffee   Regular Exercise -  NO              ROS Constitutional: Negative for fever, chills, diaphoresis, activity change, appetite change and fatigue.  HENT: Negative for hearing loss, nosebleeds, congestion, sore throat, facial swelling, drooling, trouble swallowing, neck pain, voice change, sinus pressure and tinnitus.  Eyes: Negative for photophobia, pain, discharge and visual disturbance.  Respiratory: Negative for apnea, cough,  chest tightness and wheezing.  Cardiovascular: Negative for chest pain, palpitations and leg swelling.  Gastrointestinal: Negative for nausea, vomiting, abdominal pain, diarrhea, constipation, blood in stool and abdominal distention.  Genitourinary: Negative for dysuria, urgency, frequency, hematuria and decreased urine volume.  Musculoskeletal: Negative for myalgias, back pain, joint swelling, arthralgias and gait problem.  Skin: Negative for color change, pallor, rash and wound.  Neurological: Negative for dizziness, tremors, seizures, syncope, speech difficulty, weakness, light-headedness, numbness and headaches.  Psychiatric/Behavioral: Negative for suicidal ideas, hallucinations, behavioral problems and agitation. The patient is not nervous/anxious.     PHYSICAL EXAM   BP 172/103  Pulse 81  Ht 5' (1.524 m)  Wt 129 lb  (58.514 kg)  BMI 25.19 kg/m2 Constitutional: She is oriented to person, place, and time. She appears well-developed and well-nourished. No distress.  HENT: No nasal discharge.  Head: Normocephalic and atraumatic.  Eyes: Pupils are equal and round. Right eye exhibits no discharge. Left eye exhibits no discharge.  Neck: Normal range of motion. Neck supple. No JVD present. No thyromegaly present.  Cardiovascular: Normal rate, regular rhythm, normal heart sounds. Exam reveals no gallop and no friction rub. No murmur heard.  Pulmonary/Chest: Effort normal and breath sounds normal. No stridor. No respiratory distress. She has no wheezes. She has no rales. She exhibits no tenderness.  Abdominal: Soft. Bowel sounds are normal. She exhibits no distension. There is no tenderness. There is no rebound and no guarding.  Musculoskeletal: Normal range of motion. She exhibits no edema and no tenderness.  Neurological: She is alert and oriented to person, place, and time. Coordination normal.  Skin: Skin is warm and dry. No rash noted. She is not diaphoretic. No erythema. No pallor.  Psychiatric: She has a normal mood and affect. Her behavior is normal. Judgment and thought content normal.     RUE:AVWUJ  Rhythm  -Left axis -anterior fascicular block.   ABNORMAL    ASSESSMENT AND PLAN

## 2013-03-21 NOTE — Assessment & Plan Note (Signed)
She likely has hypertension as she has been noted to have elevated blood pressure readings on more than one occasion. Due to that, I started her on amlodipine 5 mg once daily. I want her blood pressure to be controlled before her stress test. I also discussed with him the importance of decreasing sodium intake and regular exercise.

## 2013-03-21 NOTE — Assessment & Plan Note (Signed)
The patient has mild exertional dyspnea without associated chest pain. Risk factors for coronary artery disease include age, family history, hyperlipidemia and hypertension. Due to that, I recommend further evaluation with a treadmill nuclear stress test. A treadmill stress test along is likely not sufficient due to an abnormal EKG with left anterior fascicular block. I discussed with the patient the importance of lifestyle changes in order to decrease the chance of future coronary artery disease and cardiovascular events. We discussed the importance of controlling risk factors, healthy diet as well as regular exercise. I also explained to him that a normal stress test does not rule out atherosclerosis.

## 2013-03-21 NOTE — Patient Instructions (Addendum)
Start Amlodipine 5 mg once daily for high blood pressure.  Decrease salt intake in your diet.   Your physician has requested that you have en exercise stress myoview. For further information please visit [email or web address] Please follow instruction sheet, as given.  Follow up as needed.

## 2013-03-21 NOTE — Assessment & Plan Note (Signed)
Lab Results  Component Value Date   CHOL 242* 02/24/2013   HDL 93.60 02/24/2013   LDLCALC 152* 06/24/2012   LDLDIRECT 158.6 02/24/2013   TRIG 42.0 02/24/2013   CHOLHDL 3 02/24/2013   The patient has significantly elevated LDL. However, her HDL is also elevated which should be protective. Her 10 year risk of atherosclerotic cardiovascular disease is 7.5% which is slightly elevated. The patient prefers not to be on medication at this point and has not taken atorvastatin. Risks and benefits were discussed with her. She is going to try with lifestyle changes and diet.

## 2013-03-31 ENCOUNTER — Ambulatory Visit: Payer: Self-pay | Admitting: Cardiovascular Disease

## 2013-03-31 DIAGNOSIS — R079 Chest pain, unspecified: Secondary | ICD-10-CM

## 2013-04-01 ENCOUNTER — Other Ambulatory Visit: Payer: Self-pay

## 2013-04-01 DIAGNOSIS — R0600 Dyspnea, unspecified: Secondary | ICD-10-CM

## 2013-04-01 NOTE — Progress Notes (Signed)
Pt informed of stress test result. 

## 2013-06-16 ENCOUNTER — Other Ambulatory Visit: Payer: Self-pay | Admitting: Internal Medicine

## 2013-06-29 LAB — HM MAMMOGRAPHY: HM MAMMO: NORMAL

## 2013-09-04 ENCOUNTER — Other Ambulatory Visit: Payer: Self-pay

## 2013-09-13 ENCOUNTER — Other Ambulatory Visit: Payer: Self-pay | Admitting: Internal Medicine

## 2013-09-15 NOTE — Telephone Encounter (Signed)
Eprescribed.

## 2013-09-16 ENCOUNTER — Telehealth: Payer: Self-pay | Admitting: *Deleted

## 2013-09-16 NOTE — Telephone Encounter (Signed)
Ok to send in 3 month supply? Last seen 01/2013 and has no upcoming appointment scheduled.

## 2013-09-16 NOTE — Telephone Encounter (Signed)
Fine to send in a 3 month supply, but needs to schedule follow up

## 2013-09-16 NOTE — Telephone Encounter (Signed)
Pharmacy Note:  Ropinirole Hcl 1 mg tab  Fluticasone Propiona 50 mcg/inh nose AER  Atorvstatin Calcium 20mg  tab   Pt would like a 3 month supply?

## 2013-09-17 NOTE — Addendum Note (Signed)
Addended by: Theola Sequin on: 09/17/2013 02:48 PM   Modules accepted: Orders

## 2013-09-18 MED ORDER — ATORVASTATIN CALCIUM 20 MG PO TABS: ORAL_TABLET | ORAL | Status: DC

## 2013-09-18 MED ORDER — FLUTICASONE PROPIONATE 50 MCG/ACT NA SUSP: NASAL | Status: DC

## 2013-09-18 MED ORDER — ROPINIROLE HCL 1 MG PO TABS: ORAL_TABLET | ORAL | Status: DC

## 2013-09-18 NOTE — Telephone Encounter (Signed)
Rx sent to pharmacy on file with notation to call office to schedule an appointment.

## 2013-09-18 NOTE — Addendum Note (Signed)
Addended by: Theola Sequin on: 09/18/2013 02:23 PM   Modules accepted: Orders

## 2014-02-14 ENCOUNTER — Other Ambulatory Visit: Payer: Self-pay | Admitting: Cardiovascular Disease

## 2014-02-16 ENCOUNTER — Other Ambulatory Visit: Payer: Self-pay | Admitting: *Deleted

## 2014-02-16 MED ORDER — AMLODIPINE BESYLATE 5 MG PO TABS: ORAL_TABLET | ORAL | Status: DC

## 2014-02-16 NOTE — Telephone Encounter (Signed)
Requested Prescriptions   Signed Prescriptions Disp Refills  . amLODipine (NORVASC) 5 MG tablet 30 tablet 3    Sig: TAKE 1 TABLET EVERY DAY    Authorizing Provider: Kathlyn Sacramento A    Ordering User: Britt Bottom

## 2014-05-18 ENCOUNTER — Encounter: Payer: Medicare Other | Admitting: Internal Medicine

## 2014-06-29 ENCOUNTER — Ambulatory Visit (INDEPENDENT_AMBULATORY_CARE_PROVIDER_SITE_OTHER): Payer: Medicare PPO | Admitting: Internal Medicine

## 2014-06-29 ENCOUNTER — Encounter: Payer: Self-pay | Admitting: Internal Medicine

## 2014-06-29 VITALS — BP 130/80 | HR 65 | Temp 98.1°F | Ht 59.5 in | Wt 128.2 lb

## 2014-06-29 DIAGNOSIS — Z23 Encounter for immunization: Secondary | ICD-10-CM

## 2014-06-29 DIAGNOSIS — Z0000 Encounter for general adult medical examination without abnormal findings: Secondary | ICD-10-CM

## 2014-06-29 DIAGNOSIS — I10 Essential (primary) hypertension: Secondary | ICD-10-CM

## 2014-06-29 DIAGNOSIS — Z7184 Encounter for health counseling related to travel: Secondary | ICD-10-CM

## 2014-06-29 LAB — COMPREHENSIVE METABOLIC PANEL
ALBUMIN: 4.4 g/dL (ref 3.5–5.2)
ALT: 37 U/L — ABNORMAL HIGH (ref 0–35)
AST: 39 U/L — ABNORMAL HIGH (ref 0–37)
Alkaline Phosphatase: 47 U/L (ref 39–117)
BUN: 22 mg/dL (ref 6–23)
CALCIUM: 9.7 mg/dL (ref 8.4–10.5)
CO2: 26 mEq/L (ref 19–32)
Chloride: 104 mEq/L (ref 96–112)
Creatinine, Ser: 0.6 mg/dL (ref 0.4–1.2)
GFR: 106.13 mL/min (ref >60.00)
GLUCOSE: 100 mg/dL — AB (ref 70–99)
POTASSIUM: 4.2 meq/L (ref 3.5–5.1)
SODIUM: 141 meq/L (ref 135–145)
Total Bilirubin: 0.9 mg/dL (ref 0.2–1.2)
Total Protein: 7.4 g/dL (ref 6.0–8.3)

## 2014-06-29 LAB — MICROALBUMIN / CREATININE URINE RATIO
CREATININE, U: 180.2 mg/dL
Microalb Creat Ratio: 0.7 mg/g (ref 0.0–30.0)
Microalb, Ur: 1.2 mg/dL (ref 0.0–1.9)

## 2014-06-29 LAB — LIPID PANEL
CHOLESTEROL: 226 mg/dL — AB (ref 0–200)
HDL: 99.7 mg/dL (ref >39.00)
LDL Cholesterol: 111 mg/dL — ABNORMAL HIGH (ref 0–99)
NonHDL: 126.3
Total CHOL/HDL Ratio: 2
Triglycerides: 77 mg/dL (ref 0.0–149.0)
VLDL: 15.4 mg/dL (ref 0.0–40.0)

## 2014-06-29 LAB — CBC WITH DIFFERENTIAL/PLATELET
Basophils Absolute: 0 10*3/uL (ref 0.0–0.1)
Basophils Relative: 0.6 % (ref 0.0–3.0)
Eosinophils Absolute: 0.2 10*3/uL (ref 0.0–0.7)
Eosinophils Relative: 3.7 % (ref 0.0–5.0)
HEMATOCRIT: 42.8 % (ref 36.0–46.0)
Hemoglobin: 14.4 g/dL (ref 12.0–15.0)
Lymphocytes Relative: 23.5 % (ref 12.0–46.0)
Lymphs Abs: 1.4 10*3/uL (ref 0.7–4.0)
MCHC: 33.8 g/dL (ref 30.0–36.0)
MCV: 87 fl (ref 78.0–100.0)
Monocytes Absolute: 0.5 10*3/uL (ref 0.1–1.0)
Monocytes Relative: 7.9 % (ref 3.0–12.0)
Neutro Abs: 3.8 10*3/uL (ref 1.4–7.7)
Neutrophils Relative %: 64.3 % (ref 43.0–77.0)
PLATELETS: 302 10*3/uL (ref 150.0–400.0)
RBC: 4.91 Mil/uL (ref 3.87–5.11)
RDW: 13.4 % (ref 11.5–15.5)
WBC: 5.9 10*3/uL (ref 4.0–10.5)

## 2014-06-29 LAB — VITAMIN D 25 HYDROXY (VIT D DEFICIENCY, FRACTURES): VITD: 37.63 ng/mL (ref 30.00–100.00)

## 2014-06-29 MED ORDER — ROPINIROLE HCL 1 MG PO TABS: ORAL_TABLET | ORAL | Status: DC

## 2014-06-29 MED ORDER — AMLODIPINE BESYLATE 5 MG PO TABS: ORAL_TABLET | ORAL | Status: DC

## 2014-06-29 MED ORDER — ATORVASTATIN CALCIUM 20 MG PO TABS: ORAL_TABLET | ORAL | Status: DC

## 2014-06-29 MED ORDER — ALPRAZOLAM 0.25 MG PO TABS: 0.2500 mg | ORAL_TABLET | Freq: Two times a day (BID) | ORAL | Status: DC | PRN

## 2014-06-29 MED ORDER — CIPROFLOXACIN HCL 500 MG PO TABS: 500.0000 mg | ORAL_TABLET | Freq: Two times a day (BID) | ORAL | Status: DC

## 2014-06-29 NOTE — Patient Instructions (Signed)

## 2014-06-29 NOTE — Assessment & Plan Note (Signed)
Discussed upcoming trip to [place]. Will set up referral to ID for discussion of malaria prophylaxis and vaccinations. Cipro given for Traveler's diarrhea. Xanax prn for anxiety during flight.
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
## 2014-06-29 NOTE — Assessment & Plan Note (Signed)
General medical exam normal today including breast exam. PAP and pelvic deferred as normal, HPV neg in 2014. Mammogram ordered. Colonoscopy UTD. Labs today including CBC, CMP, lipids. ENcouraged healthy diet and exercise.

## 2014-06-29 NOTE — Progress Notes (Signed)
Pre visit review using our clinic review tool, if applicable. No additional management support is needed unless otherwise documented below in the visit note. 

## 2014-07-08 ENCOUNTER — Other Ambulatory Visit: Payer: Self-pay | Admitting: *Deleted

## 2014-07-08 ENCOUNTER — Telehealth: Payer: Self-pay

## 2014-07-08 DIAGNOSIS — Z7184 Encounter for health counseling related to travel: Secondary | ICD-10-CM

## 2014-07-08 MED ORDER — ALPRAZOLAM 0.25 MG PO TABS: 0.2500 mg | ORAL_TABLET | Freq: Two times a day (BID) | ORAL | Status: DC | PRN

## 2014-07-08 NOTE — Telephone Encounter (Signed)
The patient called and is hoping to find out how she can get her xananx rx.  She wasn't sure if this was a medication she needed to pick up, or if she could have it called in. Thanks!

## 2014-07-08 NOTE — Telephone Encounter (Signed)
Printed Rx.

## 2014-07-08 NOTE — Telephone Encounter (Signed)
Fine to fill xanax

## 2014-07-09 NOTE — Telephone Encounter (Signed)
Rx faxed

## 2014-07-20 ENCOUNTER — Telehealth: Payer: Self-pay | Admitting: Internal Medicine

## 2014-07-20 ENCOUNTER — Other Ambulatory Visit: Payer: Self-pay | Admitting: Internal Medicine

## 2014-07-20 NOTE — Telephone Encounter (Signed)
I sent her a Mychart message about this a while back. Her labs were normal except for mild elevation of LFTs. I recommended follow up in 3 months.

## 2014-07-20 NOTE — Telephone Encounter (Signed)
Please call pt with lab results that she had on 06/29/14. msn

## 2014-07-20 NOTE — Telephone Encounter (Signed)
Please see below, Looks like some came back but not all of them.

## 2014-07-21 NOTE — Telephone Encounter (Signed)
Notified pt. 

## 2014-08-10 ENCOUNTER — Ambulatory Visit: Payer: Self-pay | Admitting: Internal Medicine

## 2014-08-10 LAB — HM MAMMOGRAPHY: HM MAMMO: NEGATIVE

## 2014-08-11 ENCOUNTER — Encounter: Payer: Self-pay | Admitting: *Deleted

## 2014-08-26 ENCOUNTER — Encounter: Payer: Self-pay | Admitting: Internal Medicine

## 2014-09-23 ENCOUNTER — Telehealth: Payer: Self-pay | Admitting: *Deleted

## 2014-09-23 NOTE — Telephone Encounter (Signed)
Pt is coming in Monday what labs and dx? 

## 2014-09-28 ENCOUNTER — Other Ambulatory Visit (INDEPENDENT_AMBULATORY_CARE_PROVIDER_SITE_OTHER): Payer: Medicare PPO

## 2014-09-28 ENCOUNTER — Other Ambulatory Visit: Payer: Self-pay | Admitting: Internal Medicine

## 2014-09-28 DIAGNOSIS — E785 Hyperlipidemia, unspecified: Secondary | ICD-10-CM

## 2014-09-28 LAB — LIPID PANEL
CHOL/HDL RATIO: 2
Cholesterol: 194 mg/dL (ref 0–200)
HDL: 79.4 mg/dL (ref >39.00)
LDL Cholesterol: 104 mg/dL — ABNORMAL HIGH (ref 0–99)
NONHDL: 114.6
Triglycerides: 51 mg/dL (ref 0.0–149.0)
VLDL: 10.2 mg/dL (ref 0.0–40.0)

## 2014-09-28 LAB — COMPREHENSIVE METABOLIC PANEL
ALT: 34 U/L (ref 0–35)
AST: 34 U/L (ref 0–37)
Albumin: 4.3 g/dL (ref 3.5–5.2)
Alkaline Phosphatase: 49 U/L (ref 39–117)
BUN: 25 mg/dL — ABNORMAL HIGH (ref 6–23)
CALCIUM: 9.2 mg/dL (ref 8.4–10.5)
CHLORIDE: 104 meq/L (ref 96–112)
CO2: 25 mEq/L (ref 19–32)
Creatinine, Ser: 0.5 mg/dL (ref 0.4–1.2)
GFR: 119.76 mL/min (ref >60.00)
Glucose, Bld: 96 mg/dL (ref 70–99)
Potassium: 4.6 mEq/L (ref 3.5–5.1)
Sodium: 138 mEq/L (ref 135–145)
Total Bilirubin: 0.7 mg/dL (ref 0.2–1.2)
Total Protein: 6.5 g/dL (ref 6.0–8.3)

## 2014-09-28 NOTE — Telephone Encounter (Signed)
What labs and dx?  

## 2014-09-28 NOTE — Telephone Encounter (Signed)
CMP and lipids for hyperlipidemia 

## 2014-10-19 ENCOUNTER — Telehealth: Payer: Self-pay

## 2014-10-19 NOTE — Telephone Encounter (Signed)
The patient called and is hoping to get her lab results from 09/28/14.

## 2014-10-19 NOTE — Telephone Encounter (Signed)
Left vm for pt to return my call, Dr Gilford Rile sent a message with her lab results on her mychart

## 2014-10-20 ENCOUNTER — Telehealth: Payer: Self-pay | Admitting: *Deleted

## 2014-10-20 NOTE — Telephone Encounter (Signed)
Her my chart has been deactivated.

## 2014-10-20 NOTE — Telephone Encounter (Signed)
Please REMOVE mychart from pt chart.  She does not use it and she has asked for it to be removed a few different times. Thank you

## 2014-10-20 NOTE — Telephone Encounter (Signed)
Notified pt. 

## 2014-11-04 ENCOUNTER — Ambulatory Visit (INDEPENDENT_AMBULATORY_CARE_PROVIDER_SITE_OTHER): Payer: Medicare PPO | Admitting: Internal Medicine

## 2014-11-04 ENCOUNTER — Encounter: Payer: Self-pay | Admitting: Internal Medicine

## 2014-11-04 VITALS — BP 126/88 | HR 66 | Temp 98.1°F | Wt 132.0 lb

## 2014-11-04 DIAGNOSIS — M545 Low back pain, unspecified: Secondary | ICD-10-CM

## 2014-11-04 DIAGNOSIS — T148XXA Other injury of unspecified body region, initial encounter: Secondary | ICD-10-CM

## 2014-11-04 DIAGNOSIS — T148 Other injury of unspecified body region: Secondary | ICD-10-CM

## 2014-11-04 MED ORDER — IBUPROFEN 800 MG PO TABS: 800.0000 mg | ORAL_TABLET | Freq: Three times a day (TID) | ORAL | Status: DC | PRN

## 2014-11-04 MED ORDER — METHOCARBAMOL 500 MG PO TABS: 500.0000 mg | ORAL_TABLET | Freq: Three times a day (TID) | ORAL | Status: DC | PRN

## 2014-11-04 NOTE — Patient Instructions (Signed)
Back Exercises These exercises may help you when beginning to rehabilitate your injury. Your symptoms may resolve with or without further involvement from your physician, physical therapist or athletic trainer. While completing these exercises, remember:   Restoring tissue flexibility helps normal motion to return to the joints. This allows healthier, less painful movement and activity.  An effective stretch should be held for at least 30 seconds.  A stretch should never be painful. You should only feel a gentle lengthening or release in the stretched tissue. STRETCH - Extension, Prone on Elbows   Lie on your stomach on the floor, a bed will be too soft. Place your palms about shoulder width apart and at the height of your head.  Place your elbows under your shoulders. If this is too painful, stack pillows under your chest.  Allow your body to relax so that your hips drop lower and make contact more completely with the floor.  Hold this position for __________ seconds.  Slowly return to lying flat on the floor. Repeat __________ times. Complete this exercise __________ times per day.  RANGE OF MOTION - Extension, Prone Press Ups   Lie on your stomach on the floor, a bed will be too soft. Place your palms about shoulder width apart and at the height of your head.  Keeping your back as relaxed as possible, slowly straighten your elbows while keeping your hips on the floor. You may adjust the placement of your hands to maximize your comfort. As you gain motion, your hands will come more underneath your shoulders.  Hold this position __________ seconds.  Slowly return to lying flat on the floor. Repeat __________ times. Complete this exercise __________ times per day.  RANGE OF MOTION- Quadruped, Neutral Spine   Assume a hands and knees position on a firm surface. Keep your hands under your shoulders and your knees under your hips. You may place padding under your knees for  comfort.  Drop your head and point your tail bone toward the ground below you. This will round out your low back like an angry cat. Hold this position for __________ seconds.  Slowly lift your head and release your tail bone so that your back sags into a large arch, like an old horse.  Hold this position for __________ seconds.  Repeat this until you feel limber in your low back.  Now, find your "sweet spot." This will be the most comfortable position somewhere between the two previous positions. This is your neutral spine. Once you have found this position, tense your stomach muscles to support your low back.  Hold this position for __________ seconds. Repeat __________ times. Complete this exercise __________ times per day.  STRETCH - Flexion, Single Knee to Chest   Lie on a firm bed or floor with both legs extended in front of you.  Keeping one leg in contact with the floor, bring your opposite knee to your chest. Hold your leg in place by either grabbing behind your thigh or at your knee.  Pull until you feel a gentle stretch in your low back. Hold __________ seconds.  Slowly release your grasp and repeat the exercise with the opposite side. Repeat __________ times. Complete this exercise __________ times per day.  STRETCH - Hamstrings, Standing  Stand or sit and extend your right / left leg, placing your foot on a chair or foot stool  Keeping a slight arch in your low back and your hips straight forward.  Lead with your chest and   lean forward at the waist until you feel a gentle stretch in the back of your right / left knee or thigh. (When done correctly, this exercise requires leaning only a small distance.)  Hold this position for __________ seconds. Repeat __________ times. Complete this stretch __________ times per day. STRENGTHENING - Deep Abdominals, Pelvic Tilt   Lie on a firm bed or floor. Keeping your legs in front of you, bend your knees so they are both pointed  toward the ceiling and your feet are flat on the floor.  Tense your lower abdominal muscles to press your low back into the floor. This motion will rotate your pelvis so that your tail bone is scooping upwards rather than pointing at your feet or into the floor.  With a gentle tension and even breathing, hold this position for __________ seconds. Repeat __________ times. Complete this exercise __________ times per day.  STRENGTHENING - Abdominals, Crunches   Lie on a firm bed or floor. Keeping your legs in front of you, bend your knees so they are both pointed toward the ceiling and your feet are flat on the floor. Cross your arms over your chest.  Slightly tip your chin down without bending your neck.  Tense your abdominals and slowly lift your trunk high enough to just clear your shoulder blades. Lifting higher can put excessive stress on the low back and does not further strengthen your abdominal muscles.  Control your return to the starting position. Repeat __________ times. Complete this exercise __________ times per day.  STRENGTHENING - Quadruped, Opposite UE/LE Lift   Assume a hands and knees position on a firm surface. Keep your hands under your shoulders and your knees under your hips. You may place padding under your knees for comfort.  Find your neutral spine and gently tense your abdominal muscles so that you can maintain this position. Your shoulders and hips should form a rectangle that is parallel with the floor and is not twisted.  Keeping your trunk steady, lift your right hand no higher than your shoulder and then your left leg no higher than your hip. Make sure you are not holding your breath. Hold this position __________ seconds.  Continuing to keep your abdominal muscles tense and your back steady, slowly return to your starting position. Repeat with the opposite arm and leg. Repeat __________ times. Complete this exercise __________ times per day. Document Released:  11/03/2005 Document Revised: 01/08/2012 Document Reviewed: 01/28/2009 Exit[employer] Patient Information 2015 [employer]. This information is not intended to replace advice given to you by your health care provider. Make sure you discuss any questions you have with your health care provider.  

## 2014-11-04 NOTE — Progress Notes (Signed)
Pre visit review using our clinic review tool, if applicable. No additional management support is needed unless otherwise documented below in the visit note. 

## 2014-11-04 NOTE — Progress Notes (Signed)
Subjective:    Patient ID: [name], female    DOB: [birth year], 69 y.o.   MRN: [id number]  HPI  Pt presents to the clinic today with c/o back pain. She reports this started a few months ago, but has gotten worse in the last week. It is worse in the right lower back. The pain radiates up her back. She describes the pain as sharp and feels like her muscles are contracting. The pain is intermittent but it seems worse with movement and twisting. She denies any injury to the area. She has tried Ibuprofen and massage without much relief.  Review of Systems      Past Medical History  Diagnosis Date  . Jaundice     In High school  . H/O exercise stress test     at 59 years age  . Hyperlipidemia   . Heart murmur   . Asthma   . Jaundice     hx   . Hypertension     Current Outpatient Prescriptions  Medication Sig Dispense Refill  . ALLEGRA-D ALLERGY & CONGESTION 180-240 MG per 24 hr tablet TAKE 1 TABLET EVERY DAY 30 tablet 11  . ALPRAZolam (XANAX) 0.25 MG tablet Take 1 tablet (0.25 mg total) by mouth 2 (two) times daily as needed for anxiety. 60 tablet 0  . amLODipine (NORVASC) 5 MG tablet TAKE 1 TABLET EVERY DAY 90 tablet 3  . atorvastatin (LIPITOR) 20 MG tablet TAKE 1 TABLET EVERY DAY 90 tablet 3  . fluticasone (FLONASE) 50 MCG/ACT nasal spray USE 2 SPRAYS IN EACH NOSTRIL EVERY DAY 48 g 5  . Ibuprofen (ADVIL) 200 MG CAPS Take by mouth as needed.    Marland Kitchen rOPINIRole (REQUIP) 1 MG tablet TAKE ONE TABLET AT BEDTIME 90 tablet 3  . zoster vaccine live, PF, (ZOSTAVAX) 38329 UNT/0.65ML injection Inject 19,400 Units into the skin once. 1 each 0   No current facility-administered medications for this visit.    Allergies  Allergen Reactions  . Codeine Nausea Only  . Penicillins Swelling and Rash    Family History  Problem Relation Age of Onset  . Heart attack Mother   . Heart disease Mother 77  . Heart attack Father   . Heart disease Father 82  . Diabetes Sister   . Diabetes  Maternal Grandmother   . Colon cancer Neg Hx   . Breast cancer Other   . Heart disease Brother   . Heart disease Brother     History   Social History  . Marital Status: Married    Spouse Name: N/A    Number of Children: 3  . Years of Education: N/A   Occupational History  . Self Employed - Hairdresser    Social History Main Topics  . Smoking status: Never Smoker   . Smokeless tobacco: Never Used  . Alcohol Use: Yes     Comment: Wine nightly  . Drug Use: No  . Sexual Activity: Not on file   Other Topics Concern  . Not on file   Social History Narrative   Lives in [place]. Works at [employer] in [employer]      Daily Caffeine Use:  2 coffee   Regular Exercise -  NO              Constitutional: Denies fever, malaise, fatigue, headache or abrupt weight changes.  Gastrointestinal: Denies abdominal pain, bloating, constipation, diarrhea or blood in the stool.  GU: Denies urgency, frequency, pain with urination, burning sensation,  blood in urine, odor or discharge. Musculoskeletal: Pt reports back pain. Denies difficulty with gait or joint pain and swelling.  Skin: Denies redness, rashes, lesions or ulcercations.  Neurological: Denies dizziness, difficulty with memory, difficulty with speech or problems with balance and coordination.   No other specific complaints in a complete review of systems (except as listed in HPI above).  Objective:   Physical Exam   BP 126/88 mmHg  Pulse 66  Temp(Src) 98.1 F (36.7 C) (Oral)  Wt 132 lb (59.875 kg)  SpO2 98% Wt Readings from Last 3 Encounters:  11/04/14 132 lb (59.875 kg)  06/29/14 128 lb 4 oz (58.174 kg)  03/21/13 129 lb (58.514 kg)    General: Appears her stated age, well developed, well nourished in NAD. Skin: Warm, dry and intact. No rashes, lesions or ulcerations noted. Cardiovascular: Normal rate and rhythm. S1,S2 noted.  No murmur, rubs or gallops noted.  Pulmonary/Chest: Normal effort and positive vesicular  breath sounds. No respiratory distress. No wheezes, rales or ronchi noted.  Musculoskeletal: Normal flexion, extension and rotation of the spine. No pain with palpation of the thoracic and lumbar spine. Pain with palpation of the muscle of the right lower back. No CVA tenderness. Strength 5/5 BLE. Neurological: Alert and oriented. Sensation intact to BLE.  BMET    Component Value Date/Time   NA 138 09/28/2014 0909   NA 142 06/24/2012 0913   K 4.6 09/28/2014 0909   CL 104 09/28/2014 0909   CO2 25 09/28/2014 0909   GLUCOSE 96 09/28/2014 0909   GLUCOSE 88 06/24/2012 0913   BUN 25* 09/28/2014 0909   BUN 22 06/24/2012 0913   CREATININE 0.5 09/28/2014 0909   CALCIUM 9.2 09/28/2014 0909   GFRNONAA 103 06/24/2012 0913   GFRAA 119 06/24/2012 0913    Lipid Panel     Component Value Date/Time   CHOL 194 09/28/2014 0909   TRIG 51.0 09/28/2014 0909   HDL 79.40 09/28/2014 0909   HDL 93 06/24/2012 0913   CHOLHDL 2 09/28/2014 0909   VLDL 10.2 09/28/2014 0909   LDLCALC 104* 09/28/2014 0909   LDLCALC 152* 06/24/2012 0913    CBC    Component Value Date/Time   WBC 5.9 06/29/2014 0832   RBC 4.91 06/29/2014 0832   HGB 14.4 06/29/2014 0832   HCT 42.8 06/29/2014 0832   PLT 302.0 06/29/2014 0832   MCV 87.0 06/29/2014 0832   MCHC 33.8 06/29/2014 0832   RDW 13.4 06/29/2014 0832   LYMPHSABS 1.4 06/29/2014 0832   MONOABS 0.5 06/29/2014 0832   EOSABS 0.2 06/29/2014 0832   BASOSABS 0.0 06/29/2014 0832    Hgb A1C No results found for: HGBA1C      Assessment & Plan:   Muscles strain of right lower back:  Will give RX for ibuprofen 800 mg TID prn- take with food RX for flexeril 5 mg- it seems like she may be having spasms Beck exercises given  If no improvement in 1 week, follow up with PCP

## 2014-11-12 ENCOUNTER — Telehealth: Payer: Self-pay | Admitting: *Deleted

## 2014-11-12 ENCOUNTER — Other Ambulatory Visit: Payer: Self-pay | Admitting: *Deleted

## 2014-11-12 DIAGNOSIS — T148XXA Other injury of unspecified body region, initial encounter: Secondary | ICD-10-CM

## 2014-11-12 MED ORDER — METHOCARBAMOL 500 MG PO TABS: 500.0000 mg | ORAL_TABLET | Freq: Three times a day (TID) | ORAL | Status: DC | PRN

## 2014-11-12 MED ORDER — IBUPROFEN 800 MG PO TABS: 800.0000 mg | ORAL_TABLET | Freq: Three times a day (TID) | ORAL | Status: DC | PRN

## 2014-11-12 NOTE — Telephone Encounter (Signed)
OK. Fine to refill. 

## 2014-11-12 NOTE — Telephone Encounter (Signed)
Rx sent, pt notified. 

## 2014-11-12 NOTE — Telephone Encounter (Signed)
Pt called states she was seen at [facility] for back pain.  Was Rx'd Robaxin and 800 mg Ibuprofen.  Pt states she is going to [place] on 1.19.16 for 3 weeks.  Pt is requesting a refill of both medications to take with her.  Please advise refill.

## 2014-12-28 ENCOUNTER — Ambulatory Visit (INDEPENDENT_AMBULATORY_CARE_PROVIDER_SITE_OTHER)
Admission: RE | Admit: 2014-12-28 | Discharge: 2014-12-28 | Disposition: A | Discharge status: Home / Self Care | Payer: Medicare PPO | Source: Ambulatory Visit | Attending: Internal Medicine | Admitting: Internal Medicine

## 2014-12-28 ENCOUNTER — Ambulatory Visit (INDEPENDENT_AMBULATORY_CARE_PROVIDER_SITE_OTHER): Payer: Medicare PPO | Admitting: Internal Medicine

## 2014-12-28 ENCOUNTER — Ambulatory Visit: Payer: Medicare PPO | Admitting: Internal Medicine

## 2014-12-28 ENCOUNTER — Encounter (INDEPENDENT_AMBULATORY_CARE_PROVIDER_SITE_OTHER): Payer: Self-pay

## 2014-12-28 ENCOUNTER — Encounter: Payer: Self-pay | Admitting: Internal Medicine

## 2014-12-28 ENCOUNTER — Encounter: Payer: Self-pay | Admitting: *Deleted

## 2014-12-28 VITALS — BP 157/81 | HR 66 | Temp 98.5°F | Ht 59.5 in | Wt 134.0 lb

## 2014-12-28 DIAGNOSIS — E785 Hyperlipidemia, unspecified: Secondary | ICD-10-CM

## 2014-12-28 DIAGNOSIS — M545 Low back pain, unspecified: Secondary | ICD-10-CM | POA: Insufficient documentation

## 2014-12-28 DIAGNOSIS — G43B0 Ophthalmoplegic migraine, not intractable: Secondary | ICD-10-CM

## 2014-12-28 DIAGNOSIS — I10 Essential (primary) hypertension: Secondary | ICD-10-CM

## 2014-12-28 LAB — COMPREHENSIVE METABOLIC PANEL
ALK PHOS: 50 U/L (ref 39–117)
ALT: 18 U/L (ref 0–35)
AST: 20 U/L (ref 0–37)
Albumin: 4.2 g/dL (ref 3.5–5.2)
BILIRUBIN TOTAL: 0.4 mg/dL (ref 0.2–1.2)
BUN: 23 mg/dL (ref 6–23)
CO2: 28 meq/L (ref 19–32)
Calcium: 9.7 mg/dL (ref 8.4–10.5)
Chloride: 106 mEq/L (ref 96–112)
Creatinine, Ser: 0.59 mg/dL (ref 0.40–1.20)
GFR: 108.05 mL/min (ref >60.00)
GLUCOSE: 106 mg/dL — AB (ref 70–99)
POTASSIUM: 4.6 meq/L (ref 3.5–5.1)
SODIUM: 140 meq/L (ref 135–145)
Total Protein: 6.9 g/dL (ref 6.0–8.3)

## 2014-12-28 NOTE — Assessment & Plan Note (Signed)
Right sided low back pain. Most consistent with muscular spasm of the paraspinal muscles, however given persistence, will get plain xray. Will continue prn ibuprofen and Robaxin. Will set up PT. Discussed core strengthening exercises.

## 2014-12-28 NOTE — Assessment & Plan Note (Signed)
Will check LFTs with labs. Continue Atorvastatin.

## 2014-12-28 NOTE — Assessment & Plan Note (Signed)
BP Readings from Last 3 Encounters:  12/28/14 157/81  11/04/14 126/88  06/29/14 130/80   BP elevated today. Discussed potentially increasing Amlodipine to 10mg  daily. Will hold for now. Renal function with labs. Follow up in 4 weeks.

## 2014-12-28 NOTE — Assessment & Plan Note (Signed)
Discussed adding medication to help treat or prevent migraines, however given that symptoms are so intermittent, will hold off for now.

## 2014-12-28 NOTE — Progress Notes (Signed)
Subjective:    Patient ID: [name], female    DOB: 08-04-1946, 69 y.o.   MRN: [id number]  HPI  69YO female presents for follow up.  Last seen 8/31 for Wellness Visit.  Back pain - Started in January. Severe aching pain that is worsened by movement, such as rotational movement. Located right lower and mid back. No radiating pain. No numbness. No weakness. Improved with Aleve and Flexeril, but returns. No trauma to back.  HTN - Does not check BP at home typically. No headache or chest pain.  Having some visual migraines. Followed by optho. Sees wavy lines for 2-3 days. Occur less than once per month. Has not started any medication for this.  Past medical, surgical, family and social history per today's encounter.  Review of Systems  Constitutional: Negative for fever, chills, appetite change, fatigue and unexpected weight change.  Eyes: Negative for photophobia and visual disturbance.  Respiratory: Negative for shortness of breath.   Cardiovascular: Negative for chest pain and leg swelling.  Gastrointestinal: Negative for abdominal pain, diarrhea and constipation.  Musculoskeletal: Positive for myalgias, back pain and arthralgias.  Skin: Negative for color change and rash.  Neurological: Negative for dizziness, weakness, numbness and headaches.  Hematological: Negative for adenopathy. Does not bruise/bleed easily.  Psychiatric/Behavioral: Negative for dysphoric mood. The patient is not nervous/anxious.        Objective:    BP 157/81 mmHg  Pulse 66  Temp(Src) 98.5 F (36.9 C) (Oral)  Ht 4' 11.5" (1.511 m)  Wt 134 lb (60.782 kg)  BMI 26.62 kg/m2  SpO2 96% Physical Exam  Constitutional: She is oriented to person, place, and time. She appears well-developed and well-nourished. No distress.  HENT:  Head: Normocephalic and atraumatic.  Right Ear: External ear normal.  Left Ear: External ear normal.  Nose: Nose normal.  Mouth/Throat: Oropharynx is clear and moist.  No oropharyngeal exudate.  Eyes: Conjunctivae are normal. Pupils are equal, round, and reactive to light. Right eye exhibits no discharge. Left eye exhibits no discharge. No scleral icterus.  Neck: Normal range of motion. Neck supple. No tracheal deviation present. No thyromegaly present.  Cardiovascular: Normal rate, regular rhythm, normal heart sounds and intact distal pulses.  Exam reveals no gallop and no friction rub.   No murmur heard. Pulmonary/Chest: Effort normal and breath sounds normal. No respiratory distress. She has no wheezes. She has no rales. She exhibits no tenderness.  Musculoskeletal: Normal range of motion. She exhibits no edema or tenderness.       Lumbar back: She exhibits normal range of motion and no tenderness.       Back:  Lymphadenopathy:    She has no cervical adenopathy.  Neurological: She is alert and oriented to person, place, and time. No cranial nerve deficit. She exhibits normal muscle tone. Coordination normal.  Skin: Skin is warm and dry. No rash noted. She is not diaphoretic. No erythema. No pallor.  Psychiatric: She has a normal mood and affect. Her behavior is normal. Judgment and thought content normal.          Assessment & Plan:   Problem List Items Addressed This Visit      Unprioritized   Hyperlipidemia    Will check LFTs with labs. Continue Atorvastatin.      Hypertension - Primary    BP Readings from Last 3 Encounters:  12/28/14 157/81  11/04/14 126/88  06/29/14 130/80   BP elevated today. Discussed potentially increasing Amlodipine to 10mg  daily. Will  hold for now. Renal function with labs. Follow up in 4 weeks.      Relevant Orders   Comprehensive metabolic panel   Ophthalmoplegic migraine, not intractable    Discussed adding medication to help treat or prevent migraines, however given that symptoms are so intermittent, will hold off for now.      Right-sided low back pain without sciatica    Right sided low back pain.  Most consistent with muscular spasm of the paraspinal muscles, however given persistence, will get plain xray. Will continue prn ibuprofen and Robaxin. Will set up PT. Discussed core strengthening exercises.      Relevant Orders   DG Lumbar Spine Complete   Ambulatory referral to Physical Therapy       Return in about 4 weeks (around 01/25/2015) for Recheck.

## 2014-12-28 NOTE — Patient Instructions (Addendum)
We will set up physical therapy.  Xray today at [facility].  Follow up in 4 weeks.

## 2014-12-28 NOTE — Progress Notes (Signed)
Pre visit review using our clinic review tool, if applicable. No additional management support is needed unless otherwise documented below in the visit note. 

## 2015-01-25 ENCOUNTER — Encounter
Admit: 2015-01-25 | Disposition: A | Discharge status: Home / Self Care | Payer: Self-pay | Attending: Internal Medicine | Admitting: Internal Medicine

## 2015-01-29 ENCOUNTER — Encounter
Admit: 2015-01-29 | Disposition: A | Discharge status: Home / Self Care | Payer: Self-pay | Attending: Internal Medicine | Admitting: Internal Medicine

## 2015-02-04 ENCOUNTER — Ambulatory Visit: Payer: Medicare PPO | Admitting: Internal Medicine

## 2015-03-22 ENCOUNTER — Ambulatory Visit: Payer: Medicare PPO

## 2015-04-05 ENCOUNTER — Ambulatory Visit: Payer: Medicare PPO

## 2015-07-26 ENCOUNTER — Ambulatory Visit (INDEPENDENT_AMBULATORY_CARE_PROVIDER_SITE_OTHER): Payer: Medicare PPO | Admitting: Family Medicine

## 2015-07-26 ENCOUNTER — Encounter: Payer: Self-pay | Admitting: Family Medicine

## 2015-07-26 VITALS — BP 110/74 | HR 66 | Temp 98.6°F | Ht 59.5 in

## 2015-07-26 DIAGNOSIS — T148XXA Other injury of unspecified body region, initial encounter: Secondary | ICD-10-CM | POA: Insufficient documentation

## 2015-07-26 DIAGNOSIS — R238 Other skin changes: Secondary | ICD-10-CM

## 2015-07-26 NOTE — Assessment & Plan Note (Addendum)
Wound appears to be an abrasion injury that is slow to heal. No reported foreign body and no foreign body appreciated on exam. No history of DM to make wound healing slow. Extremities WWP. Is >67 yo which could impact wound healing. Suspect most of this is due to persistent moisture in the wound from occlusive bandages and topical antimicrobials. There is no sign of infection at this time. Wound has good tissue at its base and good granulation tissue around the edges. Discussed letting this lesion be exposed to the air and to discontinue use of topical antibiotics to see if providing a dry environment would help in the healing process. Advil for pain. Discussed potential for wound care referral and XR imaging to evaluate for foreign body, though the patient and myself do not think these are necessary at this time. Given return precautions.

## 2015-07-26 NOTE — Progress Notes (Signed)
Pre visit review using our clinic review tool, if applicable. No additional management support is needed unless otherwise documented below in the visit note. 

## 2015-07-26 NOTE — Progress Notes (Signed)
Patient ID: [name], female   DOB: Jul 17, 1946, 69 y.o.   MRN: [id number]  Tommi Rumps, MD Phone: [phone]  [name] is a 69 y.o. female who presents today for same day appointment.  Left leg scrape: occurred 9 weeks ago. Was at work and dropped a Architect that scraped her left anterior shin. She notes she has placed neosporin and polyspoin, and bactroban from the pharmacy on this. It has not healed. She notes she keeps it covered most of the time and with a thin layer of antibiotic ointment on it. If she leaves it uncovered it will scab some, though the scab will come off with ointment and band aid. She denies that this was a deep cut, and notes it was a superficial scrape. Denies foreign body in skin following injury. No history of DM. No history of non-healing ulcers. Feels well with no fevers or chills. No surrounding erythema.   PMH: nonsmoker.   ROS see HPI  Objective  Physical Exam Filed Vitals:   07/26/15 0834  BP: 110/74  Pulse: 66  Temp: 98.6 F (37 C)    Physical Exam  Constitutional: She is well-developed, well-nourished, and in no distress.  HENT:  Head: Normocephalic and atraumatic.  Cardiovascular: Normal rate, regular rhythm and normal heart sounds.  Exam reveals no gallop and no friction rub.   No murmur heard. Feet WWP  Pulmonary/Chest: Effort normal and breath sounds normal. No respiratory distress. She has no wheezes. She has no rales.  Skin: She is not diaphoretic.        Assessment/Plan: Please see individual problem list.  Wound of skin Wound appears to be an abrasion injury that is slow to heal. No reported foreign body and no foreign body appreciated on exam. No history of DM to make wound healing slow. Extremities WWP. Is >54 yo which could impact wound healing. Suspect most of this is due to persistent moisture in the wound from occlusive bandages and topical antimicrobials. There is no sign of infection at this time.  Wound has good tissue at its base and good granulation tissue around the edges. Discussed letting this lesion be exposed to the air and to discontinue use of topical antibiotics to see if providing a dry environment would help in the healing process. Advil for pain. Discussed potential for wound care referral and XR imaging to evaluate for foreign body, though the patient and myself do not think these are necessary at this time. Given return precautions.     Tommi Rumps  \

## 2015-07-26 NOTE — Patient Instructions (Signed)
Nice to meet you. Please let the area of your cut air out. Do not apply neosporin or bactroban ointment.  You can keep the area covered while at work, though allow this to be uncovered while at home.  You can take advil for pain. If you develop redness, fever, drainage, worsening pain, or chills please seek medical attention.

## 2015-08-02 ENCOUNTER — Other Ambulatory Visit: Payer: Self-pay | Admitting: Internal Medicine

## 2015-08-02 ENCOUNTER — Encounter: Payer: Self-pay | Admitting: Internal Medicine

## 2015-08-02 ENCOUNTER — Ambulatory Visit (INDEPENDENT_AMBULATORY_CARE_PROVIDER_SITE_OTHER): Payer: Medicare PPO | Admitting: Internal Medicine

## 2015-08-02 VITALS — BP 129/77 | HR 65 | Temp 98.2°F | Ht 59.5 in | Wt 134.2 lb

## 2015-08-02 DIAGNOSIS — R238 Other skin changes: Secondary | ICD-10-CM | POA: Diagnosis not present

## 2015-08-02 DIAGNOSIS — Z23 Encounter for immunization: Secondary | ICD-10-CM | POA: Diagnosis not present

## 2015-08-02 DIAGNOSIS — Z0000 Encounter for general adult medical examination without abnormal findings: Secondary | ICD-10-CM | POA: Diagnosis not present

## 2015-08-02 DIAGNOSIS — T148XXA Other injury of unspecified body region, initial encounter: Secondary | ICD-10-CM

## 2015-08-02 DIAGNOSIS — G2581 Restless legs syndrome: Secondary | ICD-10-CM | POA: Diagnosis not present

## 2015-08-02 DIAGNOSIS — M545 Low back pain, unspecified: Secondary | ICD-10-CM

## 2015-08-02 DIAGNOSIS — E785 Hyperlipidemia, unspecified: Secondary | ICD-10-CM

## 2015-08-02 LAB — COMPREHENSIVE METABOLIC PANEL
ALK PHOS: 50 U/L (ref 39–117)
ALT: 28 U/L (ref 0–35)
AST: 27 U/L (ref 0–37)
Albumin: 4.4 g/dL (ref 3.5–5.2)
BUN: 13 mg/dL (ref 6–23)
CO2: 31 meq/L (ref 19–32)
Calcium: 9.7 mg/dL (ref 8.4–10.5)
Chloride: 101 mEq/L (ref 96–112)
Creatinine, Ser: 0.56 mg/dL (ref 0.40–1.20)
GFR: 114.55 mL/min (ref >60.00)
GLUCOSE: 113 mg/dL — AB (ref 70–99)
POTASSIUM: 4.2 meq/L (ref 3.5–5.1)
Sodium: 140 mEq/L (ref 135–145)
TOTAL PROTEIN: 6.9 g/dL (ref 6.0–8.3)
Total Bilirubin: 0.5 mg/dL (ref 0.2–1.2)

## 2015-08-02 LAB — CBC WITH DIFFERENTIAL/PLATELET
Basophils Absolute: 0 10*3/uL (ref 0.0–0.1)
Basophils Relative: 0.7 % (ref 0.0–3.0)
EOS PCT: 4 % (ref 0.0–5.0)
Eosinophils Absolute: 0.2 10*3/uL (ref 0.0–0.7)
HCT: 43.1 % (ref 36.0–46.0)
Hemoglobin: 14.6 g/dL (ref 12.0–15.0)
LYMPHS ABS: 1.8 10*3/uL (ref 0.7–4.0)
Lymphocytes Relative: 28.7 % (ref 12.0–46.0)
MCHC: 33.8 g/dL (ref 30.0–36.0)
MCV: 86.1 fl (ref 78.0–100.0)
MONOS PCT: 9.3 % (ref 3.0–12.0)
Monocytes Absolute: 0.6 10*3/uL (ref 0.1–1.0)
NEUTROS ABS: 3.6 10*3/uL (ref 1.4–7.7)
NEUTROS PCT: 57.3 % (ref 43.0–77.0)
PLATELETS: 286 10*3/uL (ref 150.0–400.0)
RBC: 5 Mil/uL (ref 3.87–5.11)
RDW: 13.1 % (ref 11.5–15.5)
WBC: 6.2 10*3/uL (ref 4.0–10.5)

## 2015-08-02 LAB — LIPID PANEL
Cholesterol: 182 mg/dL (ref 0–200)
HDL: 93.2 mg/dL (ref >39.00)
LDL Cholesterol: 81 mg/dL (ref 0–99)
NONHDL: 89.14
TRIGLYCERIDES: 41 mg/dL (ref 0.0–149.0)
Total CHOL/HDL Ratio: 2
VLDL: 8.2 mg/dL (ref 0.0–40.0)

## 2015-08-02 MED ORDER — MELOXICAM 15 MG PO TABS: 15.0000 mg | ORAL_TABLET | Freq: Every day | ORAL | Status: DC

## 2015-08-02 MED ORDER — ROPINIROLE HCL 1 MG PO TABS: 2.0000 mg | ORAL_TABLET | Freq: Every day | ORAL | Status: DC

## 2015-08-02 NOTE — Assessment & Plan Note (Signed)
Restless legs poorly controlled. Will increase Requip to 2mg  at bedtime. Discussed potential side effects of this medication. Follow up in 4 weeks.

## 2015-08-02 NOTE — Patient Instructions (Signed)
STOP Ibuprofen.  START Meloxicam $RemoveBeforeD'15mg'vRfEmEcisblASr$  daily to help with back pain. We will also set up physical therapy.  Increase Requip to $RemoveB'2mg'sGxyeVVk$  at bedtime.  Health Maintenance Adopting a healthy lifestyle and getting preventive care can go a long way to promote health and wellness. Talk with your health care provider about what schedule of regular examinations is right for you. This is a good chance for you to check in with your provider about disease prevention and staying healthy. In between checkups, there are plenty of things you can do on your own. Experts have done a lot of research about which lifestyle changes and preventive measures are most likely to keep you healthy. Ask your health care provider for more information. WEIGHT AND DIET  Eat a healthy diet  Be sure to include plenty of vegetables, fruits, low-fat dairy products, and lean protein.  Do not eat a lot of foods high in solid fats, added sugars, or salt.  Get regular exercise. This is one of the most important things you can do for your health.  Most adults should exercise for at least 150 minutes each week. The exercise should increase your heart rate and make you sweat (moderate-intensity exercise).  Most adults should also do strengthening exercises at least twice a week. This is in addition to the moderate-intensity exercise.  Maintain a healthy weight  Body mass index (BMI) is a measurement that can be used to identify possible weight problems. It estimates body fat based on height and weight. Your health care provider can help determine your BMI and help you achieve or maintain a healthy weight.  For females 58 years of age and older:   A BMI below 18.5 is considered underweight.  A BMI of 18.5 to 24.9 is normal.  A BMI of 25 to 29.9 is considered overweight.  A BMI of 30 and above is considered obese.  Watch levels of cholesterol and blood lipids  You should start having your blood tested for lipids and cholesterol  at 67 years of age, then have this test every 5 years.  You may need to have your cholesterol levels checked more often if:  Your lipid or cholesterol levels are high.  You are older than 67 years of age.  You are at high risk for heart disease.  CANCER SCREENING   Lung Cancer  Lung cancer screening is recommended for adults 96-26 years old who are at high risk for lung cancer because of a history of smoking.  A yearly low-dose CT scan of the lungs is recommended for people who:  Currently smoke.  Have quit within the past 15 years.  Have at least a 30-pack-year history of smoking. A pack year is smoking an average of one pack of cigarettes a day for 1 year.  Yearly screening should continue until it has been 15 years since you quit.  Yearly screening should stop if you develop a health problem that would prevent you from having lung cancer treatment.  Breast Cancer  Practice breast self-awareness. This means understanding how your breasts normally appear and feel.  It also means doing regular breast self-exams. Let your health care provider know about any changes, no matter how small.  If you are in your 20s or 30s, you should have a clinical breast exam (CBE) by a health care provider every 1-3 years as part of a regular health exam.  If you are 44 or older, have a CBE every year. Also consider having a breast  X-ray (mammogram) every year.  If you have a family history of breast cancer, talk to your health care provider about genetic screening.  If you are at high risk for breast cancer, talk to your health care provider about having an MRI and a mammogram every year.  Breast cancer gene (BRCA) assessment is recommended for women who have family members with BRCA-related cancers. BRCA-related cancers include:  Breast.  Ovarian.  Tubal.  Peritoneal cancers.  Results of the assessment will determine the need for genetic counseling and BRCA1 and BRCA2  testing. Cervical Cancer Routine pelvic examinations to screen for cervical cancer are no longer recommended for nonpregnant women who are considered low risk for cancer of the pelvic organs (ovaries, uterus, and vagina) and who do not have symptoms. A pelvic examination may be necessary if you have symptoms including those associated with pelvic infections. Ask your health care provider if a screening pelvic exam is right for you.   The Pap test is the screening test for cervical cancer for women who are considered at risk.  If you had a hysterectomy for a problem that was not cancer or a condition that could lead to cancer, then you no longer need Pap tests.  If you are older than 65 years, and you have had normal Pap tests for the past 10 years, you no longer need to have Pap tests.  If you have had past treatment for cervical cancer or a condition that could lead to cancer, you need Pap tests and screening for cancer for at least 20 years after your treatment.  If you no longer get a Pap test, assess your risk factors if they change (such as having a new sexual partner). This can affect whether you should start being screened again.  Some women have medical problems that increase their chance of getting cervical cancer. If this is the case for you, your health care provider may recommend more frequent screening and Pap tests.  The human papillomavirus (HPV) test is another test that may be used for cervical cancer screening. The HPV test looks for the virus that can cause cell changes in the cervix. The cells collected during the Pap test can be tested for HPV.  The HPV test can be used to screen women 52 years of age and older. Getting tested for HPV can extend the interval between normal Pap tests from three to five years.  An HPV test also should be used to screen women of any age who have unclear Pap test results.  After 67 years of age, women should have HPV testing as often as Pap  tests.  Colorectal Cancer  This type of cancer can be detected and often prevented.  Routine colorectal cancer screening usually begins at 67 years of age and continues through 67 years of age.  Your health care provider may recommend screening at an earlier age if you have risk factors for colon cancer.  Your health care provider may also recommend using home test kits to check for hidden blood in the stool.  A small camera at the end of a tube can be used to examine your colon directly (sigmoidoscopy or colonoscopy). This is done to check for the earliest forms of colorectal cancer.  Routine screening usually begins at age 2.  Direct examination of the colon should be repeated every 5-10 years through 67 years of age. However, you may need to be screened more often if early forms of precancerous polyps or small  growths are found. Skin Cancer  Check your skin from head to toe regularly.  Tell your health care provider about any new moles or changes in moles, especially if there is a change in a mole's shape or color.  Also tell your health care provider if you have a mole that is larger than the size of a pencil eraser.  Always use sunscreen. Apply sunscreen liberally and repeatedly throughout the day.  Protect yourself by wearing long sleeves, pants, a wide-brimmed hat, and sunglasses whenever you are outside. HEART DISEASE, DIABETES, AND HIGH BLOOD PRESSURE   Have your blood pressure checked at least every 1-2 years. High blood pressure causes heart disease and increases the risk of stroke.  If you are between 3 years and 20 years old, ask your health care provider if you should take aspirin to prevent strokes.  Have regular diabetes screenings. This involves taking a blood sample to check your fasting blood sugar level.  If you are at a normal weight and have a low risk for diabetes, have this test once every three years after 67 years of age.  If you are overweight and  have a high risk for diabetes, consider being tested at a younger age or more often. PREVENTING INFECTION  Hepatitis B  If you have a higher risk for hepatitis B, you should be screened for this virus. You are considered at high risk for hepatitis B if:  You were born in a country where hepatitis B is common. Ask your health care provider which countries are considered high risk.  Your parents were born in a high-risk country, and you have not been immunized against hepatitis B (hepatitis B vaccine).  You have HIV or AIDS.  You use needles to inject street drugs.  You live with someone who has hepatitis B.  You have had sex with someone who has hepatitis B.  You get hemodialysis treatment.  You take certain medicines for conditions, including cancer, organ transplantation, and autoimmune conditions. Hepatitis C  Blood testing is recommended for:  Everyone born from [birth year] through 1963.  Anyone with known risk factors for hepatitis C. Sexually transmitted infections (STIs)  You should be screened for sexually transmitted infections (STIs) including gonorrhea and chlamydia if:  You are sexually active and are younger than 67 years of age.  You are older than 67 years of age and your health care provider tells you that you are at risk for this type of infection.  Your sexual activity has changed since you were last screened and you are at an increased risk for chlamydia or gonorrhea. Ask your health care provider if you are at risk.  If you do not have HIV, but are at risk, it may be recommended that you take a prescription medicine daily to prevent HIV infection. This is called pre-exposure prophylaxis (PrEP). You are considered at risk if:  You are sexually active and do not regularly use condoms or know the HIV status of your partner(s).  You take drugs by injection.  You are sexually active with a partner who has HIV. Talk with your health care provider about whether you  are at high risk of being infected with HIV. If you choose to begin PrEP, you should first be tested for HIV. You should then be tested every 3 months for as long as you are taking PrEP.  PREGNANCY   If you are premenopausal and you may become pregnant, ask your health care provider about preconception counseling.  If you may become pregnant, take 400 to 800 micrograms (mcg) of folic acid every day.  If you want to prevent pregnancy, talk to your health care provider about birth control (contraception). OSTEOPOROSIS AND MENOPAUSE   Osteoporosis is a disease in which the bones lose minerals and strength with aging. This can result in serious bone fractures. Your risk for osteoporosis can be identified using a bone density scan.  If you are 10 years of age or older, or if you are at risk for osteoporosis and fractures, ask your health care provider if you should be screened.  Ask your health care provider whether you should take a calcium or vitamin D supplement to lower your risk for osteoporosis.  Menopause may have certain physical symptoms and risks.  Hormone replacement therapy may reduce some of these symptoms and risks. Talk to your health care provider about whether hormone replacement therapy is right for you.  HOME CARE INSTRUCTIONS   Schedule regular health, dental, and eye exams.  Stay current with your immunizations.   Do not use any tobacco products including cigarettes, chewing tobacco, or electronic cigarettes.  If you are pregnant, do not drink alcohol.  If you are breastfeeding, limit how much and how often you drink alcohol.  Limit alcohol intake to no more than 1 drink per day for nonpregnant women. One drink equals 12 ounces of beer, 5 ounces of wine, or 1 ounces of hard liquor.  Do not use street drugs.  Do not share needles.  Ask your health care provider for help if you need support or information about quitting drugs.  Tell your health care provider if  you often feel depressed.  Tell your health care provider if you have ever been abused or do not feel safe at home. Document Released: 05/01/2011 Document Revised: 03/02/2014 Document Reviewed: 09/17/2013 [facility] Patient Information 2015 [place], [place]. This information is not intended to replace advice given to you by your health care provider. Make sure you discuss any questions you have with your health care provider.

## 2015-08-02 NOTE — Assessment & Plan Note (Signed)
Recurrent right low back pain. Will stop ibuprofen and change to Meloxicam. Reviewed previous imaging which showed DJD. Will set up PT, as this worked well for her in the past. Follow up 4 weeks and prn.

## 2015-08-02 NOTE — Progress Notes (Signed)
Pre visit review using our clinic review tool, if applicable. No additional management support is needed unless otherwise documented below in the visit note. 

## 2015-08-02 NOTE — Assessment & Plan Note (Signed)
Wound appears to be healing well. Encouraged use of vasaline or Medihoney. Follow up prn.

## 2015-08-02 NOTE — Addendum Note (Signed)
Addended by: Vernetta Honey on: 08/02/2015 01:51 PM   Modules accepted: Orders

## 2015-08-02 NOTE — Assessment & Plan Note (Signed)
Health maintenance UTD except for mammogram which was scheduled. Encouraged healthy diet and exercise. Discussed core strengthening and falls prevention. Labs as ordered.

## 2015-08-02 NOTE — Assessment & Plan Note (Signed)
General medical exam normal today including breast exam .PAP and pelvic deferred as PAP normal 2014, HPV neg. Mammogram ordered. Colonoscopy UTD. Flu and Prevnar today. Labs today. Follow up 4 weeks and prn.

## 2015-08-02 NOTE — Progress Notes (Addendum)
Subjective:    Patient ID: [name], female    DOB: 17-Dec-1945, 69 y.o.   MRN: [id number]  HPI  The patient is here for annual Medicare wellness examination and management of other chronic and acute problems.   The risk factors are reflected in the social history.  The roster of all physicians providing medical care to patient - is listed in the Snapshot section of the chart.  Activities of daily living:  The patient is 100% independent in all ADLs: dressing, toileting, feeding as well as independent mobility. Lives in [place] with husband. No pets. Lives in two story house.   No falls this year. No injuries.  Home safety : The patient has smoke detectors in the home. They wear seatbelts.  There are no firearms at home. There is no violence in the home.   There is no risks for hepatitis, STDs or HIV. There is no history of blood transfusion. They have no travel history to infectious disease endemic areas of the world.  The patient has seen their dentist in the last six month. (Dr. Zigmund Daniel) They have seen their eye doctor in the last year. [facility]) No issues with hearing.  They have deferred audiologic testing in the last year.   They do not  have excessive sun exposure. Discussed the need for sun protection: hats, long sleeves and use of sunscreen if there is significant sun exposure. (Dr. Nehemiah Massed)  Diet: the importance of a healthy diet is discussed. They do have a healthy diet.  The benefits of regular aerobic exercise were discussed. She does not exercise, but very active at work.  Depression screen: there are no signs or vegative symptoms of depression- irritability, change in appetite, anhedonia, sadness/tearfullness.  Cognitive assessment: the patient manages all their financial and personal affairs and is actively engaged. They could relate day,date,year and events.  HCPOA - husband, then son  The following portions of the patient's history were  reviewed and updated as appropriate: allergies, current medications, past family history, past medical history,  past surgical history, past social history  and problem list.  Visual acuity was not assessed per patient preference since she has regular follow up with her ophthalmologist. Hearing and body mass index were assessed and reviewed.   During the course of the visit the patient was educated and counseled about appropriate screening and preventive services including : fall prevention , diabetes screening, nutrition counseling, colorectal cancer screening, and recommended immunizations.    ACUTE ISSUES:  Back pain - Continues to have aching pain in lower back. Completed physical therapy with some improvement last year. Taking prn Ibuprofen and Robaxin with some improvement.  Restless Legs - Symptoms poorly controlled on Requip. Wakes frequently at night with leg movement.  Leg would - Left lower leg in July. Cut on plywood. Slow to heal and come tenderness at sight.  Wt Readings from Last 3 Encounters:  08/02/15 134 lb 4 oz (60.895 kg)  12/28/14 134 lb (60.782 kg)  11/04/14 132 lb (59.875 kg)   BP Readings from Last 3 Encounters:  08/02/15 129/77  07/26/15 110/74  12/28/14 157/81    Past Medical History  Diagnosis Date  . Jaundice     In High school  . H/O exercise stress test     at 89 years age  . Hyperlipidemia   . Heart murmur   . Asthma   . Jaundice     hx   . Hypertension    Family  History  Problem Relation Age of Onset  . Heart attack Mother   . Heart disease Mother 50  . Heart attack Father   . Heart disease Father 32  . Diabetes Sister   . Diabetes Maternal Grandmother   . Colon cancer Neg Hx   . Breast cancer Other   . Heart disease Brother   . Heart disease Brother    Past Surgical History  Procedure Laterality Date  . Tubal ligation  1980   Social History   Social History  . Marital Status: Married    Spouse Name: N/A  . Number of  Children: 3  . Years of Education: N/A   Occupational History  . Self Employed - Hairdresser    Social History Main Topics  . Smoking status: Never Smoker   . Smokeless tobacco: Never Used  . Alcohol Use: Yes     Comment: Wine nightly  . Drug Use: No  . Sexual Activity: Not Asked   Other Topics Concern  . None   Social History Narrative   Lives in [place]. Works at [employer] in [employer]      Daily Caffeine Use:  2 coffee   Regular Exercise -  NO             Review of Systems  Constitutional: Negative for fever, chills, appetite change, fatigue and unexpected weight change.  Eyes: Negative for visual disturbance.  Respiratory: Negative for shortness of breath.   Cardiovascular: Negative for chest pain and leg swelling.  Gastrointestinal: Negative for nausea, vomiting, abdominal pain, diarrhea, constipation and abdominal distention.  Musculoskeletal: Positive for back pain and arthralgias. Negative for myalgias.  Skin: Positive for wound. Negative for color change and rash.  Neurological: Positive for tremors (restless legs). Negative for weakness, light-headedness and headaches.  Hematological: Negative for adenopathy. Does not bruise/bleed easily.  Psychiatric/Behavioral: Positive for sleep disturbance. Negative for dysphoric mood. The patient is not nervous/anxious.        Objective:    BP 129/77 mmHg  Pulse 65  Temp(Src) 98.2 F (36.8 C) (Oral)  Ht 4' 11.5" (1.511 m)  Wt 134 lb 4 oz (60.895 kg)  BMI 26.67 kg/m2  SpO2 96% Physical Exam  Constitutional: She is oriented to person, place, and time. She appears well-developed and well-nourished. No distress.  HENT:  Head: Normocephalic and atraumatic.  Right Ear: External ear normal.  Left Ear: External ear normal.  Nose: Nose normal.  Mouth/Throat: Oropharynx is clear and moist. No oropharyngeal exudate.  Eyes: Conjunctivae are normal. Pupils are equal, round, and reactive to light. Right eye exhibits no  discharge. Left eye exhibits no discharge. No scleral icterus.  Neck: Normal range of motion. Neck supple. No tracheal deviation present. No thyromegaly present.  Cardiovascular: Normal rate, regular rhythm, normal heart sounds and intact distal pulses.  Exam reveals no gallop and no friction rub.   No murmur heard. Pulmonary/Chest: Effort normal and breath sounds normal. No accessory muscle usage. No tachypnea. No respiratory distress. She has no decreased breath sounds. She has no wheezes. She has no rales. She exhibits no tenderness. Right breast exhibits no inverted nipple, no mass, no nipple discharge, no skin change and no tenderness. Left breast exhibits no inverted nipple, no mass, no nipple discharge, no skin change and no tenderness. Breasts are symmetrical.  Abdominal: Soft. Bowel sounds are normal. She exhibits no distension and no mass. There is no tenderness. There is no rebound and no guarding.  Musculoskeletal: Normal range of motion.  She exhibits no edema or tenderness.  Lymphadenopathy:    She has no cervical adenopathy.  Neurological: She is alert and oriented to person, place, and time. No cranial nerve deficit. She exhibits normal muscle tone. Coordination normal.  Skin: Skin is warm and dry. No rash noted. She is not diaphoretic. No erythema. No pallor.     Psychiatric: She has a normal mood and affect. Her behavior is normal. Judgment and thought content normal.          Assessment & Plan:  Patient was given a handout regarding current recommendations for health maintenance and preventative care on the AVS.  Problem List Items Addressed This Visit      Unprioritized   Medicare annual wellness visit, subsequent - Primary    Health maintenance UTD except for mammogram which was scheduled. Encouraged healthy diet and exercise. Discussed core strengthening and falls prevention. Labs as ordered.      Relevant Orders   MM Digital Screening   CBC with  Differential/Platelet   Comprehensive metabolic panel   Lipid panel   Restless leg syndrome    Restless legs poorly controlled. Will increase Requip to 2mg  at bedtime. Discussed potential side effects of this medication. Follow up in 4 weeks.      Relevant Medications   rOPINIRole (REQUIP) 1 MG tablet   Right-sided low back pain without sciatica    Recurrent right low back pain. Will stop ibuprofen and change to Meloxicam. Reviewed previous imaging which showed DJD. Will set up PT, as this worked well for her in the past. Follow up 4 weeks and prn.      Relevant Medications   meloxicam (MOBIC) 15 MG tablet   Other Relevant Orders   Ambulatory referral to Physical Therapy   Routine general medical examination at a health care facility    General medical exam normal today including breast exam .PAP and pelvic deferred as PAP normal 2014, HPV neg. Mammogram ordered. Colonoscopy UTD. Flu and Prevnar today. Labs today. Follow up 4 weeks and prn.      Wound of skin    Wound appears to be healing well. Encouraged use of vasaline or Medihoney. Follow up prn.          Return in about 4 weeks (around 08/30/2015) for Recheck.

## 2015-08-16 ENCOUNTER — Ambulatory Visit: Payer: Medicare PPO

## 2015-08-30 ENCOUNTER — Encounter: Payer: Self-pay | Admitting: Internal Medicine

## 2015-08-30 ENCOUNTER — Ambulatory Visit
Admission: RE | Admit: 2015-08-30 | Discharge: 2015-08-30 | Disposition: A | Discharge status: Home / Self Care | Payer: Medicare PPO | Source: Ambulatory Visit | Attending: Internal Medicine | Admitting: Internal Medicine

## 2015-08-30 ENCOUNTER — Ambulatory Visit (INDEPENDENT_AMBULATORY_CARE_PROVIDER_SITE_OTHER): Payer: Medicare PPO | Admitting: Internal Medicine

## 2015-08-30 ENCOUNTER — Ambulatory Visit: Payer: Medicare PPO | Attending: Internal Medicine | Admitting: Physical Therapy

## 2015-08-30 VITALS — BP 133/75 | HR 74 | Temp 98.2°F | Ht 60.0 in | Wt 133.2 lb

## 2015-08-30 DIAGNOSIS — Z0000 Encounter for general adult medical examination without abnormal findings: Secondary | ICD-10-CM

## 2015-08-30 DIAGNOSIS — Z8249 Family history of ischemic heart disease and other diseases of the circulatory system: Secondary | ICD-10-CM

## 2015-08-30 DIAGNOSIS — G2581 Restless legs syndrome: Secondary | ICD-10-CM

## 2015-08-30 DIAGNOSIS — M545 Low back pain, unspecified: Secondary | ICD-10-CM

## 2015-08-30 DIAGNOSIS — L720 Epidermal cyst: Secondary | ICD-10-CM | POA: Diagnosis not present

## 2015-08-30 DIAGNOSIS — I10 Essential (primary) hypertension: Secondary | ICD-10-CM | POA: Diagnosis not present

## 2015-08-30 DIAGNOSIS — L821 Other seborrheic keratosis: Secondary | ICD-10-CM | POA: Diagnosis not present

## 2015-08-30 DIAGNOSIS — L578 Other skin changes due to chronic exposure to nonionizing radiation: Secondary | ICD-10-CM | POA: Diagnosis not present

## 2015-08-30 DIAGNOSIS — L814 Other melanin hyperpigmentation: Secondary | ICD-10-CM | POA: Diagnosis not present

## 2015-08-30 DIAGNOSIS — Z1283 Encounter for screening for malignant neoplasm of skin: Secondary | ICD-10-CM | POA: Diagnosis not present

## 2015-08-30 DIAGNOSIS — D1800 Hemangioma unspecified site: Secondary | ICD-10-CM | POA: Diagnosis not present

## 2015-08-30 DIAGNOSIS — Z1231 Encounter for screening mammogram for malignant neoplasm of breast: Secondary | ICD-10-CM | POA: Diagnosis not present

## 2015-08-30 DIAGNOSIS — L219 Seborrheic dermatitis, unspecified: Secondary | ICD-10-CM | POA: Diagnosis not present

## 2015-08-30 DIAGNOSIS — L300 Nummular dermatitis: Secondary | ICD-10-CM | POA: Diagnosis not present

## 2015-08-30 DIAGNOSIS — Z85828 Personal history of other malignant neoplasm of skin: Secondary | ICD-10-CM | POA: Diagnosis not present

## 2015-08-30 MED ORDER — FEXOFENADINE-PSEUDOEPHED ER 180-240 MG PO TB24: 1.0000 | ORAL_TABLET | Freq: Every day | ORAL | Status: AC

## 2015-08-30 NOTE — Assessment & Plan Note (Signed)
Strong family history of CAD. Pt had normal stress test in past. She is interested in additional evaluation. Currently asymptomatic. Encouraged use of Atorvastatin. Will look into Coronary Artery Calcium Scoring.

## 2015-08-30 NOTE — Assessment & Plan Note (Signed)
BP Readings from Last 3 Encounters:  08/30/15 133/75  08/02/15 129/77  07/26/15 110/74   BP well controlled. Will continue Amlodipine.

## 2015-08-30 NOTE — Assessment & Plan Note (Signed)
Symptoms slightly improved with use of Advil. Discussed changing to Meloxicam and using Robaxin prn. PT starting this week. Follow up prn.

## 2015-08-30 NOTE — Assessment & Plan Note (Signed)
Symptoms improved with higher dose of Requip. Will continue.

## 2015-08-30 NOTE — Progress Notes (Signed)
Pre visit review using our clinic review tool, if applicable. No additional management support is needed unless otherwise documented below in the visit note. 

## 2015-08-30 NOTE — Progress Notes (Signed)
Subjective:    Patient ID: [name], female    DOB: 1945-12-21, 69 y.o.   MRN: [id number]  HPI  69YO female presents for follow up.  Last seen for Medicare Wellness Visit in early October. Having worsening restless legs at that time. Requip dose was increased.  Also having right sciatic pain at that time. Changed to Meloxicam and started with PT.  Restless legs - Symptoms improved with requip. Sleeping well. No side effects noted from medication.  Sciatic pain - Starting PT today. Continues to have some aching back pain. Was not taking Meloxicam, however using Advil with some improvement. Not taking Robaxin.  HTN - Taking BP medication. No CP, palpitations. BP at home has been elevated at home, but questions if machine is accurate.  HL - Not currently taking Atorvastatin. Plans to restart. Has been skeptical about using because a family member said it may cause cancer. Notes strong family h/o CAD. Concerned about her risk. No CP, dyspnea. Had stress test in the past which was reportedly normal.  Wt Readings from Last 3 Encounters:  08/30/15 133 lb 4 oz (60.442 kg)  08/02/15 134 lb 4 oz (60.895 kg)  12/28/14 134 lb (60.782 kg)   BP Readings from Last 3 Encounters:  08/30/15 133/75  08/02/15 129/77  07/26/15 110/74    Past Medical History  Diagnosis Date  . Jaundice     In High school  . H/O exercise stress test     at 75 years age  . Hyperlipidemia   . Heart murmur   . Asthma   . Jaundice     hx   . Hypertension    Family History  Problem Relation Age of Onset  . Heart attack Mother   . Heart disease Mother 37  . Heart attack Father   . Heart disease Father 65  . Diabetes Sister   . Diabetes Maternal Grandmother   . Colon cancer Neg Hx   . Breast cancer Other   . Heart disease Brother   . Heart disease Brother    Past Surgical History  Procedure Laterality Date  . Tubal ligation  1980   Social History   Social History  . Marital Status: Married      Spouse Name: N/A  . Number of Children: 3  . Years of Education: N/A   Occupational History  . Self Employed - Hairdresser    Social History Main Topics  . Smoking status: Never Smoker   . Smokeless tobacco: Never Used  . Alcohol Use: Yes     Comment: Wine nightly  . Drug Use: No  . Sexual Activity: Not Asked   Other Topics Concern  . None   Social History Narrative   Lives in [place]. Works at [employer] in [employer]      Daily Caffeine Use:  2 coffee   Regular Exercise -  NO             Review of Systems  Constitutional: Negative for fever, chills, appetite change, fatigue and unexpected weight change.  Eyes: Negative for visual disturbance.  Respiratory: Negative for cough and shortness of breath.   Cardiovascular: Negative for chest pain, palpitations and leg swelling.  Gastrointestinal: Negative for nausea, vomiting, abdominal pain, diarrhea and constipation.  Musculoskeletal: Positive for myalgias, back pain and arthralgias.  Skin: Negative for color change and rash.  Hematological: Negative for adenopathy. Does not bruise/bleed easily.  Psychiatric/Behavioral: Negative for suicidal ideas, sleep disturbance and dysphoric mood.  The patient is not nervous/anxious.        Objective:    BP 133/75 mmHg  Pulse 74  Temp(Src) 98.2 F (36.8 C) (Oral)  Ht 5' (1.524 m)  Wt 133 lb 4 oz (60.442 kg)  BMI 26.02 kg/m2  SpO2 95% Physical Exam  Constitutional: She is oriented to person, place, and time. She appears well-developed and well-nourished. No distress.  HENT:  Head: Normocephalic and atraumatic.  Right Ear: External ear normal.  Left Ear: External ear normal.  Nose: Nose normal.  Mouth/Throat: Oropharynx is clear and moist. No oropharyngeal exudate.  Eyes: Conjunctivae are normal. Pupils are equal, round, and reactive to light. Right eye exhibits no discharge. Left eye exhibits no discharge. No scleral icterus.  Neck: Normal range of motion. Neck supple. No  tracheal deviation present. No thyromegaly present.  Cardiovascular: Normal rate, regular rhythm, normal heart sounds and intact distal pulses.  Exam reveals no gallop and no friction rub.   No murmur heard. Pulmonary/Chest: Effort normal and breath sounds normal. No respiratory distress. She has no wheezes. She has no rales. She exhibits no tenderness.  Musculoskeletal: Normal range of motion. She exhibits no edema or tenderness.       Lumbar back: She exhibits pain. She exhibits normal range of motion and no tenderness.  Lymphadenopathy:    She has no cervical adenopathy.  Neurological: She is alert and oriented to person, place, and time. No cranial nerve deficit. She exhibits normal muscle tone. Coordination normal.  Skin: Skin is warm and dry. No rash noted. She is not diaphoretic. No erythema. No pallor.  Psychiatric: She has a normal mood and affect. Her behavior is normal. Judgment and thought content normal.          Assessment & Plan:   Problem List Items Addressed This Visit      Unprioritized   Family history of ischemic heart disease and other diseases of the circulatory system    Strong family history of CAD. Pt had normal stress test in past. She is interested in additional evaluation. Currently asymptomatic. Encouraged use of Atorvastatin. Will look into Coronary Artery Calcium Scoring.      Hypertension    BP Readings from Last 3 Encounters:  08/30/15 133/75  08/02/15 129/77  07/26/15 110/74   BP well controlled. Will continue Amlodipine.      Restless leg syndrome - Primary    Symptoms improved with higher dose of Requip. Will continue.      Right-sided low back pain without sciatica    Symptoms slightly improved with use of Advil. Discussed changing to Meloxicam and using Robaxin prn. PT starting this week. Follow up prn.          Return in about 6 months (around 02/27/2016) for Recheck.

## 2015-08-30 NOTE — Patient Instructions (Signed)
Follow up in 6 months and sooner as needed.  We will look into Coronary Artery Calcium Scoring.

## 2015-08-31 NOTE — Therapy (Signed)
[place] [facility] [address], [place], [address] Phone: [phone]   Fax:  [phone]  Physical Therapy Evaluation  Patient Details  Name: [name] MRN: [id number] Date of Birth: Nov 19, 1945 Referring Provider: Candie Chroman  Encounter Date: 08/30/2015      PT End of Session - 08/31/15 1044    Visit Number 1   Number of Visits 5   Date for PT Re-Evaluation 09/28/15   PT Start Time 6295   PT Stop Time 2841   PT Time Calculation (min) 30 min   Activity Tolerance Patient tolerated treatment well;No increased pain   Behavior During Therapy [facility] for tasks assessed/performed      Past Medical History  Diagnosis Date  . Jaundice     In High school  . H/O exercise stress test     at 58 years age  . Hyperlipidemia   . Heart murmur   . Asthma   . Jaundice     hx   . Hypertension     Past Surgical History  Procedure Laterality Date  . Tubal ligation  1980    There were no vitals filed for this visit.  Visit Diagnosis:  Right-sided low back pain without sciatica - Plan: PT plan of care cert/re-cert      Subjective Assessment - 08/30/15 1740    Subjective Patient is a 69 year old female who reports right sided back pain that is occasional with twisting and bending forward combined movements. Patient is reporting 0/10 pain currently.    Currently in Pain? No/denies            [facility] PT Assessment - 08/31/15 0001    Assessment   Medical Diagnosis right side low back pain   Referring Provider Gilford Rile, J   Prior Therapy yes, last year   Precautions   Precautions None   Restrictions   Weight Bearing Restrictions No   Balance Screen   Has the patient fallen in the past 6 months No   Has the patient had a decrease in activity level because of a fear of falling?  No   Is the patient reluctant to leave their home because of a fear of falling?  No   Home Environment   Living Environment Private  residence   Living Arrangements Spouse/significant other   Type of [place] to enter   Home Layout Two level   Prior Function   Level of [place] Full time employment   Cognition   Overall Cognitive Status Within Functional Limits for tasks assessed   ROM / Strength   AROM / PROM / Strength AROM;Strength   AROM   Overall AROM  Within functional limits for tasks performed   Strength   Overall Strength Within functional limits for tasks performed   Palpation   Spinal mobility normal   Ambulation/Gait   Gait Pattern Within Functional Limits       Treatment this session: Palpation of lumbar paraspinal muscles with mild tenderness noted on the right.  Theraputic Dry needling performed to right lumbar multifidus muscles x 3 with localized twitch response achieved.  Patient had no adverse reactions to procedure. All precautions explained to patient.             PT Education - 08/31/15 1043    Education provided Yes   Education Details HEP to include lumbar rotation, single knee to chest. Precautions of  Dry Needling.   Person(s) Educated Patient   Methods Explanation;Demonstration   Comprehension Verbalized understanding;Returned demonstration             PT Long Term Goals - 08/31/15 1048    PT LONG TERM GOAL #1   Title Patient will be independent with HEP to manage pain symptoms.    Baseline Needs education regarding Low back stretching exercises.    Time 4   Period Weeks   Status New   PT LONG TERM GOAL #2   Title Patient will report no back pain for improved ability to perform work duties.    Baseline occasional 5/10 pain   Time 4   Period Weeks   Status New               Plan - 08/31/15 1044    Clinical Impression Statement Patient is a 69 year old female who comes to evaluation without reports of pain this date. However reports that she has pain occasionally hen she twists or performs bending  with twisting. I am unable to reproduce pain at thsi time. Patient reports that her MD stated that there is nothing going on in her spine, therefore believes it to be a muscular issue. Pt has some tenderness with palpation on the right side in lumbar mscules.    Pt will benefit from skilled therapeutic intervention in order to improve on the following deficits Improper body mechanics;Pain;Postural dysfunction   Rehab Potential Good   PT Frequency 1x / week   PT Duration 4 weeks   PT Treatment/Interventions Therapeutic exercise;Manual techniques;Patient/family education;Neuromuscular re-education   PT Home Exercise Plan lumbar rotations, SKTC,    Consulted and Agree with Plan of Care Patient         Problem List Patient Active Problem List   Diagnosis Date Noted  . Family history of ischemic heart disease and other diseases of the circulatory system 08/30/2015  . Medicare annual wellness visit, subsequent 08/02/2015  . Routine general medical examination at a health care facility 08/02/2015  . Right-sided low back pain without sciatica 12/28/2014  . Ophthalmoplegic migraine, not intractable 12/28/2014  . Hypertension   . Bunion, right foot 02/24/2013  . Nonspecific abnormal electrocardiogram (ECG) (EKG) 02/24/2013  . Allergic rhinitis 06/24/2012  . Restless leg syndrome 11/13/2011  . Hyperlipidemia 10/05/2011    Gaynor Genco, PT, MPT, GCS 08/31/2015, 10:51 AM  [place] [facility] [address] [place], [address] Phone: [phone]   Fax:  [phone]  Name: [name] MRN: [id number] Date of Birth: 1946/05/05

## 2015-09-02 ENCOUNTER — Ambulatory Visit: Payer: Medicare PPO | Admitting: Physical Therapy

## 2015-09-06 ENCOUNTER — Encounter: Payer: Medicare PPO | Admitting: Physical Therapy

## 2015-09-10 ENCOUNTER — Encounter: Payer: Medicare PPO | Admitting: Physical Therapy

## 2015-09-13 ENCOUNTER — Ambulatory Visit: Payer: Medicare PPO | Admitting: Physical Therapy

## 2015-09-13 ENCOUNTER — Ambulatory Visit: Payer: Medicare PPO | Attending: Internal Medicine | Admitting: Physical Therapy

## 2015-09-13 DIAGNOSIS — M545 Low back pain, unspecified: Secondary | ICD-10-CM

## 2015-09-13 NOTE — Therapy (Signed)
[place] [facility] [address], [place], [address] Phone: [phone]   Fax:  [phone]  Physical Therapy Treatment  Patient Details  Name: [name] MRN: [id number] Date of Birth: 1946/02/10 Referring Provider: Candie Chroman  Encounter Date: 09/13/2015      PT End of Session - 09/13/15 1714    Visit Number 2   Number of Visits 5   Date for PT Re-Evaluation 09/28/15   PT Start Time [id number]   PT Stop Time 1710   PT Time Calculation (min) 25 min   Activity Tolerance Patient tolerated treatment well;No increased pain   Behavior During Therapy [facility] for tasks assessed/performed      Past Medical History  Diagnosis Date  . Jaundice     In High school  . H/O exercise stress test     at 51 years age  . Hyperlipidemia   . Heart murmur   . Asthma   . Jaundice     hx   . Hypertension     Past Surgical History  Procedure Laterality Date  . Tubal ligation  1980    There were no vitals filed for this visit.  Visit Diagnosis:  Right-sided low back pain without sciatica      Subjective Assessment - 09/13/15 1712    Subjective Patient reports she has had no pain. Maybe felt a little twinge in low back but that is all.    Currently in Pain? No/denies       Treatment this session: Palpation of lumbar paraspinal muscles with mild tenderness noted on the right.  Theraputic Dry needling performed to right lumbar multifidus muscles x 4 with localized twitch response achieved.  Patient had no adverse reactions to procedure. All precautions explained to patient. Lumbar rotation x 1 min in hooklying.          PT Education - 09/13/15 1713    Education provided Yes   Education Details limbar rotation exercise.    Person(s) Educated Patient   Methods Explanation;Demonstration   Comprehension Verbalized understanding;Returned demonstration             PT Long Term Goals - 08/31/15 1048    PT  LONG TERM GOAL #1   Title Patient will be independent with HEP to manage pain symptoms.    Baseline Needs education regarding Low back stretching exercises.    Time 4   Period Weeks   Status New   PT LONG TERM GOAL #2   Title Patient will report no back pain for improved ability to perform work duties.    Baseline occasional 5/10 pain   Time 4   Period Weeks   Status New               Plan - 09/13/15 1715    Clinical Impression Statement Patient reports she has not had back pain this week. She felt like the dry needling has helped.    Pt will benefit from skilled therapeutic intervention in order to improve on the following deficits Improper body mechanics;Pain;Postural dysfunction   Rehab Potential Good   PT Frequency 1x / week   PT Duration 4 weeks   PT Treatment/Interventions Therapeutic exercise;Manual techniques;Patient/family education;Neuromuscular re-education   PT Home Exercise Plan lumbar rotations, SKTC,    Consulted and Agree with Plan of Care Patient        Problem List Patient Active Problem List   Diagnosis Date Noted  . Family history of  ischemic heart disease and other diseases of the circulatory system 08/30/2015  . Medicare annual wellness visit, subsequent 08/02/2015  . Routine general medical examination at a health care facility 08/02/2015  . Right-sided low back pain without sciatica 12/28/2014  . Ophthalmoplegic migraine, not intractable 12/28/2014  . Hypertension   . Bunion, right foot 02/24/2013  . Nonspecific abnormal electrocardiogram (ECG) (EKG) 02/24/2013  . Allergic rhinitis 06/24/2012  . Restless leg syndrome 11/13/2011  . Hyperlipidemia 10/05/2011    Trayven Lumadue, PT, MPT, GCS 09/13/2015, 5:17 PM  [place] [facility] [address], [place], [address] Phone: [phone]   Fax:  [phone]  Name: [name] MRN: [id number] Date of Birth:  08-09-1946

## 2015-09-20 ENCOUNTER — Ambulatory Visit: Payer: Medicare PPO | Admitting: Physical Therapy

## 2015-09-27 ENCOUNTER — Ambulatory Visit: Payer: Medicare PPO | Admitting: Physical Therapy

## 2015-09-27 DIAGNOSIS — M545 Low back pain, unspecified: Secondary | ICD-10-CM

## 2015-09-27 NOTE — Therapy (Signed)
[place] [facility] [address] [place], [address] Phone: [phone]   Fax:  [phone]  Physical Therapy Treatment  Patient Details  Name: [name] MRN: [id number] Date of Birth: Apr 30, 1946 Referring Provider: Candie Chroman  Encounter Date: 09/27/2015      PT End of Session - 09/27/15 0908    Visit Number 3   Number of Visits 5   Date for PT Re-Evaluation 09/28/15   PT Start Time 0830   PT Stop Time [id number]   PT Time Calculation (min) 25 min   Activity Tolerance Patient tolerated treatment well;No increased pain   Behavior During Therapy [facility] for tasks assessed/performed      Past Medical History  Diagnosis Date  . Jaundice     In High school  . H/O exercise stress test     at 77 years age  . Hyperlipidemia   . Heart murmur   . Asthma   . Jaundice     hx   . Hypertension     Past Surgical History  Procedure Laterality Date  . Tubal ligation  1980    There were no vitals filed for this visit.  Visit Diagnosis:  Right-sided low back pain without sciatica      Subjective Assessment - 09/27/15 0906    Subjective Patient reports she has had a few twinges, but reports she feels like it is helping.    Currently in Pain? No/denies   Multiple Pain Sites No          Treatment this session: Palpation of lumbar paraspinal muscles with mild tenderness noted on the right. PA joint mobs to lumbar spine x3 bouts grade II. Theraputic Dry needling performed to right lumbar multifidus muscles x 4 with localized twitch response achieved.  Patient had no adverse reactions to procedure. All precautions explained to patient. Lumbar rotation x 1 min in hooklying          PT Education - 09/27/15 0907    Education provided Yes   Education Details lumbar rotations, knee to chest, full stretch    Person(s) Educated Patient   Methods Explanation;Demonstration   Comprehension Verbalized  understanding;Returned demonstration             PT Long Term Goals - 08/31/15 1048    PT LONG TERM GOAL #1   Title Patient will be independent with HEP to manage pain symptoms.    Baseline Needs education regarding Low back stretching exercises.    Time 4   Period Weeks   Status New   PT LONG TERM GOAL #2   Title Patient will report no back pain for improved ability to perform work duties.    Baseline occasional 5/10 pain   Time 4   Period Weeks   Status New               Plan - 09/27/15 0910    Clinical Impression Statement Patient reports she has had a few twinges this past week, but nothing significant. Patient has had continued relief from back pain episodes.   Pt will benefit from skilled therapeutic intervention in order to improve on the following deficits Improper body mechanics;Pain;Postural dysfunction   Rehab Potential Good   PT Frequency 1x / week   PT Duration 4 weeks   PT Treatment/Interventions Therapeutic exercise;Manual techniques;Patient/family education;Neuromuscular re-education   PT Home Exercise Plan lumbar rotations, SKTC,    Consulted and Agree with Plan of Care Patient  Problem List Patient Active Problem List   Diagnosis Date Noted  . Family history of ischemic heart disease and other diseases of the circulatory system 08/30/2015  . Medicare annual wellness visit, subsequent 08/02/2015  . Routine general medical examination at a health care facility 08/02/2015  . Right-sided low back pain without sciatica 12/28/2014  . Ophthalmoplegic migraine, not intractable 12/28/2014  . Hypertension   . Bunion, right foot 02/24/2013  . Nonspecific abnormal electrocardiogram (ECG) (EKG) 02/24/2013  . Allergic rhinitis 06/24/2012  . Restless leg syndrome 11/13/2011  . Hyperlipidemia 10/05/2011    Gervase Colberg, PT, MPT, GCS 09/27/2015, 9:13 AM  [place] [facility] [address], [place], [address] Phone: [phone]   Fax:  [phone]  Name: [name] MRN: [id number] Date of Birth: 1946-03-04

## 2015-10-04 ENCOUNTER — Ambulatory Visit: Payer: Medicare PPO | Attending: Internal Medicine | Admitting: Physical Therapy

## 2015-10-04 DIAGNOSIS — M545 Low back pain, unspecified: Secondary | ICD-10-CM

## 2015-10-04 NOTE — Therapy (Signed)
[place] [facility] [address], [place], [address] Phone: [phone]   Fax:  [phone]  Physical Therapy Treatment  Patient Details  Name: [name] MRN: [id number] Date of Birth: February 13, 1946 Referring Provider: Candie Chroman  Encounter Date: 10/21/2015      PT End of Session - 10-21-15 0857    Visit Number 4   Number of Visits 5   Date for PT Re-Evaluation 09/28/15   PT Start Time 0830   PT Stop Time [id number]   PT Time Calculation (min) 25 min   Activity Tolerance Patient tolerated treatment well;No increased pain   Behavior During Therapy [facility] for tasks assessed/performed      Past Medical History  Diagnosis Date  . Jaundice     In High school  . H/O exercise stress test     at 72 years age  . Hyperlipidemia   . Heart murmur   . Asthma   . Jaundice     hx   . Hypertension     Past Surgical History  Procedure Laterality Date  . Tubal ligation  1980    There were no vitals filed for this visit.  Visit Diagnosis:  Right-sided low back pain without sciatica      Subjective Assessment - Oct 21, 2015 0855    Subjective Patient reports she has been feeling good. Little twinges here and there but no pain like before.    Currently in Pain? No/denies          Treatment this session: Palpation of lumbar paraspinal muscles with mild tenderness noted on the right. PA joint mobs to lumbar spine x3 bouts grade II. Theraputic Dry needling performed to right lumbar multifidus muscles x 4 with localized twitch response achieved.  Patient had no adverse reactions to procedure. All precautions explained to patient. Lumbar rotation x 1 min in hooklying           PT Education - 2015/10/21 0856    Education provided Yes   Education Details lumbar rotations   Person(s) Educated Patient   Methods Explanation;Demonstration   Comprehension Verbalized understanding;Returned demonstration             PT Long Term Goals - 08/31/15 1048    PT LONG TERM GOAL #1   Title Patient will be independent with HEP to manage pain symptoms.    Baseline Needs education regarding Low back stretching exercises.    Time 4   Period Weeks   Status New   PT LONG TERM GOAL #2   Title Patient will report no back pain for improved ability to perform work duties.    Baseline occasional 5/10 pain   Time 4   Period Weeks   Status New               Plan - Oct 21, 2015 0857    Clinical Impression Statement Patient reports she has been feeling good. No pain currently.    Pt will benefit from skilled therapeutic intervention in order to improve on the following deficits Improper body mechanics;Pain;Postural dysfunction   Rehab Potential Good   PT Frequency 1x / week   PT Duration 4 weeks   PT Treatment/Interventions Therapeutic exercise;Manual techniques;Patient/family education;Neuromuscular re-education   PT Home Exercise Plan lumbar rotations, SKTC,    Consulted and Agree with Plan of Care Patient          G-Codes - 10/21/15 0858    Functional Assessment Tool Used clinical impression  Functional Limitation Mobility: Walking and moving around   Mobility: Walking and Moving Around Current Status [phone]) 0 percent impaired, limited or restricted   Mobility: Walking and Moving Around Goal Status [phone]) 0 percent impaired, limited or restricted      Problem List Patient Active Problem List   Diagnosis Date Noted  . Family history of ischemic heart disease and other diseases of the circulatory system 08/30/2015  . Medicare annual wellness visit, subsequent 08/02/2015  . Routine general medical examination at a health care facility 08/02/2015  . Right-sided low back pain without sciatica 12/28/2014  . Ophthalmoplegic migraine, not intractable 12/28/2014  . Hypertension   . Bunion, right foot 02/24/2013  . Nonspecific abnormal electrocardiogram (ECG) (EKG) 02/24/2013  . Allergic rhinitis  06/24/2012  . Restless leg syndrome 11/13/2011  . Hyperlipidemia 10/05/2011    [name], PT, MPT, GCS 10/04/2015, 8:59 AM  [place] [facility] [address] [place], [address] Phone: [phone]   Fax:  [phone]  Name: [name] MRN: [id number] Date of Birth: Dec 10, 1945

## 2015-10-07 ENCOUNTER — Other Ambulatory Visit: Payer: Self-pay | Admitting: Internal Medicine

## 2015-10-29 ENCOUNTER — Other Ambulatory Visit: Payer: Self-pay | Admitting: Internal Medicine

## 2015-10-29 ENCOUNTER — Telehealth: Payer: Self-pay | Admitting: *Deleted

## 2015-10-29 NOTE — Telephone Encounter (Signed)
Please be advised. Thanks

## 2015-10-29 NOTE — Telephone Encounter (Signed)
Patient wanted to FYI Dr. Gilford Rile that [facility] Rx mail will fax over a form for her to sign. Patient also requested to have her allegra D added to the [employer].

## 2015-11-03 ENCOUNTER — Other Ambulatory Visit: Payer: Self-pay

## 2015-11-03 DIAGNOSIS — G2581 Restless legs syndrome: Secondary | ICD-10-CM

## 2015-11-03 MED ORDER — MELOXICAM 15 MG PO TABS: 15.0000 mg | ORAL_TABLET | Freq: Every day | ORAL | Status: DC

## 2015-11-03 MED ORDER — AMLODIPINE BESYLATE 5 MG PO TABS: 5.0000 mg | ORAL_TABLET | Freq: Every day | ORAL | Status: DC

## 2015-11-03 MED ORDER — ATORVASTATIN CALCIUM 20 MG PO TABS: ORAL_TABLET | ORAL | Status: DC

## 2015-11-03 MED ORDER — ROPINIROLE HCL 1 MG PO TABS: 2.0000 mg | ORAL_TABLET | Freq: Every day | ORAL | Status: DC

## 2015-11-05 ENCOUNTER — Telehealth: Payer: Self-pay | Admitting: Internal Medicine

## 2015-11-05 DIAGNOSIS — G2581 Restless legs syndrome: Secondary | ICD-10-CM

## 2015-11-05 MED ORDER — ROPINIROLE HCL 1 MG PO TABS: 2.0000 mg | ORAL_TABLET | Freq: Every day | ORAL | Status: DC

## 2015-11-05 NOTE — Telephone Encounter (Signed)
[name] called regarding for a medication for Ropinirole 1mg . She stated it's a medication that Dr Gilford Rile prescribe. Pharmacy is [facility] is [phone]. Thank you!

## 2015-12-27 ENCOUNTER — Other Ambulatory Visit: Payer: Self-pay | Admitting: Internal Medicine

## 2015-12-27 DIAGNOSIS — M549 Dorsalgia, unspecified: Secondary | ICD-10-CM

## 2016-01-03 ENCOUNTER — Ambulatory Visit: Payer: Medicare PPO | Admitting: Physical Therapy

## 2016-01-10 ENCOUNTER — Encounter: Payer: Medicare PPO | Admitting: Physical Therapy

## 2016-01-17 ENCOUNTER — Ambulatory Visit: Payer: Medicare PPO | Attending: Internal Medicine | Admitting: Physical Therapy

## 2016-01-17 ENCOUNTER — Encounter: Payer: Medicare PPO | Admitting: Physical Therapy

## 2016-01-17 DIAGNOSIS — M6281 Muscle weakness (generalized): Secondary | ICD-10-CM | POA: Diagnosis not present

## 2016-01-17 DIAGNOSIS — M546 Pain in thoracic spine: Secondary | ICD-10-CM | POA: Diagnosis not present

## 2016-01-17 NOTE — Therapy (Signed)
[facility] [address] [place], [address] Phone: [phone]   Fax:  [phone]  Physical Therapy Evaluation  Patient Details  Name: [name] MRN: [id number] Date of Birth: 05-16-1946 Referring Provider: walker, J  Encounter Date: 01/17/2016      PT End of Session - 01/17/16 0910    Visit Number 1   Number of Visits 9   Date for PT Re-Evaluation 03/20/16   PT Start Time 0815   PT Stop Time 0910   PT Time Calculation (min) 55 min   Activity Tolerance Patient tolerated treatment well;No increased pain   Behavior During Therapy [facility] for tasks assessed/performed      Past Medical History  Diagnosis Date  . Jaundice     In High school  . H/O exercise stress test     at 20 years age  . Hyperlipidemia   . Heart murmur   . Asthma   . Jaundice     hx   . Hypertension     Past Surgical History  Procedure Laterality Date  . Tubal ligation  1980    There were no vitals filed for this visit.  Visit Diagnosis:  Right-sided thoracic back pain - Plan: PT plan of care cert/re-cert  Muscle weakness - Plan: PT plan of care cert/re-cert      Subjective Assessment - 01/17/16 0819    Subjective "I'm not hurting right now, but every once in a while I get a little twinge". Pt reports she is nervous about her pain starting up again.   Pertinent History Pt reports onset of pain 08/2014, idiopathically. She has had two bouts of PT since that time. One focused on strengthening, the other did dry needling. Dry needling improved pt considerably.    Patient Stated Goals avoid future outbreaks of pain.   Currently in Pain? No/denies            [facility] PT Assessment - 01/17/16 0001    Assessment   Medical Diagnosis back pain unspecified   Referring Provider walker, J   Prior Therapy two bouts   Precautions   Precautions None   Restrictions   Weight Bearing Restrictions No   Balance Screen   Has  the patient fallen in the past 6 months No   Has the patient had a decrease in activity level because of a fear of falling?  No   Is the patient reluctant to leave their home because of a fear of falling?  No   Home Environment   Living Environment Private residence   Living Arrangements Spouse/significant other   Type of [place] to enter   Prior Function   Level of [place] Independent   Vocation Full time employment   Vocation Requirements standing, leaning, lifting   Leisure playing with grandchildren   ROM / Strength   AROM / PROM / Strength AROM   AROM   Overall AROM Comments WNL except R lumbar rotation, R lateral flexion limited and painful. With OP pain decr.   Strength   Overall Strength Other (comment)  WNL except R glute med 3/5   Palpation   Spinal mobility pain with PA over T10-T12   Palpation comment mildly irritated gluteals on R.            Objective: Educated and had pt perform the followign HEP to address specific findings in eval: White ball forward roll, performed with deep  breathing and alternated with L rotation to stretch/relieve tension in R paraspinals.  Standing R rotation stretching 3x10.  The importance of walking, performing some kind of regular exercise to address overall stiffness. Posture is remarkably good so deferring addressing any typical back strengthening for now.                PT Education - 02-03-2016 [id number]    Education provided Yes   Education Details educated pt on progression of PT.   Person(s) Educated Patient   Methods Explanation   Comprehension Verbalized understanding             PT Long Term Goals - 02-03-2016 0920    PT LONG TERM GOAL #1   Title Patient will be independent with HEP to manage pain symptoms.    Baseline Needs education regarding Low back stretching exercises.    Time 8   Period Weeks   Status New   PT LONG TERM GOAL #2   Title Patient will report no back  pain for improved ability to perform work duties.    Baseline Occasional twinges of pain   Time 8   Period Weeks   Status New   PT LONG TERM GOAL #3   Title Pt will improve hip abduction strength on R to 5/5 indicating decr. compensation with paraspinals   Baseline 3/5   Time 8   Period Weeks   Status New               Plan - 03-Feb-2016 0911    Clinical Impression Statement Pt is a pleasant 70 y/o female with c/o pain primarily, some "twinges" noted in R back. Denies pain in hips. 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 Reports her pain is minimal but she is concerned it is returning. Currently able to reproduce pain with R rotation, R lateral flexion, and R hip flexion indicatiig impaired motor pattern. Pt would benefit from skilled PT to addres muscle weakness, pain, decr. ROM to minimize likelihood of return of more significant pain.   Pt will benefit from skilled therapeutic intervention in order to improve on the following deficits Hypomobility;Pain;Decreased range of motion;Decreased strength   Rehab Potential Good   PT Frequency 1x / week   PT Duration 8 weeks   PT Treatment/Interventions Therapeutic exercise;Manual techniques;Patient/family education;Neuromuscular re-education;Dry needling;ADLs/Self Care Home Management   PT Home Exercise Plan lumbar rotation, lumbar lateral flexion   Consulted and Agree with Plan of Care Patient          G-Codes - 02/03/16 [id number]    Functional Assessment Tool Used MMT, ROM   Functional Limitation Changing and maintaining body position   Mobility: Walking and Moving Around Current Status [id number]) At least 1 percent but less than 20 percent impaired, limited or restricted   Mobility: Walking and Moving Around Goal Status [phone]) 0 percent impaired, limited or restricted       Problem List Patient Active Problem List   Diagnosis Date Noted  . Family history of ischemic heart disease and other diseases  of the circulatory system 08/30/2015  . Medicare annual wellness visit, subsequent 08/02/2015  . Routine general medical examination at a health care facility 08/02/2015  . Right-sided low back pain without sciatica 12/28/2014  . Ophthalmoplegic migraine, not intractable 12/28/2014  . Hypertension   . Bunion, right foot 02/24/2013  . Nonspecific abnormal electrocardiogram (ECG) (EKG) 02/24/2013  . Allergic rhinitis 06/24/2012  . Restless leg syndrome 11/13/2011  .  Hyperlipidemia 10/05/2011    Fisher,Benjamin PT DPT 01/17/2016, 9:27 AM  [facility] [address] [place], [address] Phone: [phone]   Fax:  [phone]  Name: [name] MRN: [id number] Date of Birth: [birth year]

## 2016-01-31 ENCOUNTER — Encounter: Payer: Medicare PPO | Admitting: Physical Therapy

## 2016-02-07 ENCOUNTER — Ambulatory Visit: Payer: Medicare PPO | Admitting: Physical Therapy

## 2016-02-14 ENCOUNTER — Encounter: Payer: Medicare PPO | Admitting: Physical Therapy

## 2016-02-21 ENCOUNTER — Encounter: Payer: Medicare PPO | Admitting: Physical Therapy

## 2016-03-13 DIAGNOSIS — I10 Essential (primary) hypertension: Secondary | ICD-10-CM | POA: Diagnosis not present

## 2016-03-13 DIAGNOSIS — H547 Unspecified visual loss: Secondary | ICD-10-CM | POA: Diagnosis not present

## 2016-03-13 DIAGNOSIS — G43909 Migraine, unspecified, not intractable, without status migrainosus: Secondary | ICD-10-CM | POA: Diagnosis not present

## 2016-03-13 DIAGNOSIS — E663 Overweight: Secondary | ICD-10-CM | POA: Diagnosis not present

## 2016-03-13 DIAGNOSIS — E785 Hyperlipidemia, unspecified: Secondary | ICD-10-CM | POA: Diagnosis not present

## 2016-03-13 DIAGNOSIS — Z85828 Personal history of other malignant neoplasm of skin: Secondary | ICD-10-CM | POA: Diagnosis not present

## 2016-03-13 DIAGNOSIS — Z6826 Body mass index (BMI) 26.0-26.9, adult: Secondary | ICD-10-CM | POA: Diagnosis not present

## 2016-03-13 DIAGNOSIS — M545 Low back pain: Secondary | ICD-10-CM | POA: Diagnosis not present

## 2016-03-20 ENCOUNTER — Ambulatory Visit: Payer: Medicare PPO | Attending: Internal Medicine | Admitting: Physical Therapy

## 2016-03-20 DIAGNOSIS — M546 Pain in thoracic spine: Secondary | ICD-10-CM

## 2016-03-20 DIAGNOSIS — M6281 Muscle weakness (generalized): Secondary | ICD-10-CM | POA: Diagnosis not present

## 2016-03-21 NOTE — Therapy (Signed)
[facility] [address] [place], [address] Phone: [phone]   Fax:  [phone]  Physical Therapy Treatment  Patient Details  Name: [name] MRN: [id number] Date of Birth: Oct 24, 1946 Referring Provider: walker, J  Encounter Date: 03/20/2016      PT End of Session - 03/20/16 1413    Visit Number 2   Number of Visits 9   Date for PT Re-Evaluation 03/20/16   PT Start Time [id number]   PT Stop Time 1410   PT Time Calculation (min) 25 min   Activity Tolerance Patient tolerated treatment well;No increased pain   Behavior During Therapy [facility] for tasks assessed/performed      Past Medical History  Diagnosis Date  . Jaundice     In High school  . H/O exercise stress test     at 53 years age  . Hyperlipidemia   . Heart murmur   . Asthma   . Jaundice     hx   . Hypertension     Past Surgical History  Procedure Laterality Date  . Tubal ligation  1980    There were no vitals filed for this visit.      Subjective Assessment - 03/20/16 1412    Subjective Pt reports no change in symptoms, she is not having any difficulty at this time.   Pertinent History Pt reports onset of pain 08/2014, idiopathically. She has had two bouts of PT since that time. One focused on strengthening, the other did dry needling. Dry needling improved pt considerably.    Patient Stated Goals avoid future outbreaks of pain.   Currently in Pain? No/denies               Objective: Reassessed postures and activities required for working including standing, leaning, reaching for extended periods of time.  Issued and had perform for form correction strengthening routine to address resiliency:  Leg press Seated row Seated lat pulldown Chest press  Educated on how to select appropriate weight, sets and reps scheme.  Pt reported mild anterior hip pain with leg press so modified position to ER to decr. Pain.                         PT Long Term Goals - 01/17/16 0920    PT LONG TERM GOAL #1   Title Patient will be independent with HEP to manage pain symptoms.    Baseline Needs education regarding Low back stretching exercises.    Time 8   Period Weeks   Status New   PT LONG TERM GOAL #2   Title Patient will report no back pain for improved ability to perform work duties.    Baseline Occasional twinges of pain   Time 8   Period Weeks   Status New   PT LONG TERM GOAL #3   Title Pt will improve hip abduction strength on R to 5/5 indicating decr. compensation with paraspinals   Baseline 3/5   Time 8   Period Weeks   Status New               Plan - 03/20/16 1413    Clinical Impression Statement At this time pt is appropriate for d/c. She has minimal to no pain and is now able to verbalize her understanding of exercises to minimize potential future outbursts of pain.   Rehab Potential Good   PT Frequency 1x / week  PT Duration 8 weeks   PT Treatment/Interventions Therapeutic exercise;Manual techniques;Patient/family education;Neuromuscular re-education;Dry needling;ADLs/Self Care Home Management   PT Home Exercise Plan lumbar rotation, lumbar lateral flexion   Consulted and Agree with Plan of Care Patient      Patient will benefit from skilled therapeutic intervention in order to improve the following deficits and impairments:  Hypomobility, Pain, Decreased range of motion, Decreased strength  Visit Diagnosis: Right-sided thoracic back pain  Muscle weakness     Problem List Patient Active Problem List   Diagnosis Date Noted  . Family history of ischemic heart disease and other diseases of the circulatory system 08/30/2015  . Medicare annual wellness visit, subsequent 08/02/2015  . Routine general medical examination at a health care facility 08/02/2015  . Right-sided low back pain without sciatica 12/28/2014  . Ophthalmoplegic migraine, not  intractable 12/28/2014  . Hypertension   . Bunion, right foot 02/24/2013  . Nonspecific abnormal electrocardiogram (ECG) (EKG) 02/24/2013  . Allergic rhinitis 06/24/2012  . Restless leg syndrome 11/13/2011  . Hyperlipidemia 10/05/2011    Jaamal Farooqui PT DPT 03/21/2016, 6:44 AM  [facility] [address] [place], [address] Phone: [phone]   Fax:  [phone]  Name: [name] MRN: [id number] Date of Birth: 1945-12-10

## 2016-07-10 DIAGNOSIS — H04221 Epiphora due to insufficient drainage, right lacrimal gland: Secondary | ICD-10-CM | POA: Diagnosis not present

## 2016-07-17 DIAGNOSIS — H04221 Epiphora due to insufficient drainage, right lacrimal gland: Secondary | ICD-10-CM | POA: Diagnosis not present

## 2016-07-24 DIAGNOSIS — H04221 Epiphora due to insufficient drainage, right lacrimal gland: Secondary | ICD-10-CM | POA: Diagnosis not present

## 2016-09-04 ENCOUNTER — Telehealth: Payer: Self-pay | Admitting: Family Medicine

## 2016-09-04 NOTE — Telephone Encounter (Signed)
Pt is a former Dr. Gilford Rile patient. She will see Dr. Lacinda Axon on 12/11. She is requesting that Dr. Lacinda Axon put in a order to have a Mammo and bone density test done. This is for her yearly physical. Please advise pt at [phone].

## 2016-09-04 NOTE — Telephone Encounter (Signed)
Please advise, thanks.

## 2016-09-05 ENCOUNTER — Other Ambulatory Visit: Payer: Self-pay | Admitting: Family Medicine

## 2016-09-05 DIAGNOSIS — E2839 Other primary ovarian failure: Secondary | ICD-10-CM

## 2016-09-05 DIAGNOSIS — Z1239 Encounter for other screening for malignant neoplasm of breast: Secondary | ICD-10-CM

## 2016-09-05 NOTE — Telephone Encounter (Signed)
Orders placed.

## 2016-09-05 NOTE — Telephone Encounter (Signed)
Please assist in scheduling, thanks, looks like last mammo was done at [facility], thanks

## 2016-09-06 NOTE — Telephone Encounter (Signed)
Noted, thanks!

## 2016-09-06 NOTE — Telephone Encounter (Signed)
Faxed to GI. They will contact pt to schedule appt

## 2016-09-17 DIAGNOSIS — M545 Low back pain: Secondary | ICD-10-CM | POA: Diagnosis not present

## 2016-09-25 ENCOUNTER — Encounter: Payer: Self-pay | Admitting: Family Medicine

## 2016-09-25 ENCOUNTER — Ambulatory Visit (INDEPENDENT_AMBULATORY_CARE_PROVIDER_SITE_OTHER): Payer: Medicare PPO | Admitting: Family Medicine

## 2016-09-25 ENCOUNTER — Telehealth: Payer: Self-pay | Admitting: Family Medicine

## 2016-09-25 DIAGNOSIS — M545 Low back pain, unspecified: Secondary | ICD-10-CM | POA: Insufficient documentation

## 2016-09-25 MED ORDER — TRAMADOL HCL 50 MG PO TABS: 50.0000 mg | ORAL_TABLET | Freq: Two times a day (BID) | ORAL | 0 refills | Status: DC | PRN

## 2016-09-25 MED ORDER — DICLOFENAC SODIUM 75 MG PO TBEC: 75.0000 mg | DELAYED_RELEASE_TABLET | Freq: Two times a day (BID) | ORAL | 0 refills | Status: DC

## 2016-09-25 NOTE — Addendum Note (Signed)
Addended by: Coral Spikes on: 09/25/2016 04:51 PM   Modules accepted: Orders

## 2016-09-25 NOTE — Telephone Encounter (Signed)
Pt called and wished to have both medications sent to her local pharmacy. [facility], [place] [address]

## 2016-09-25 NOTE — Telephone Encounter (Signed)
Patient advised and verbalized understanding 

## 2016-09-25 NOTE — Patient Instructions (Signed)
Use the diclofenac twice daily.  Take the tramadol as needed.  We will arrange the PT.  Take care  Dr. Lacinda Axon

## 2016-09-25 NOTE — Progress Notes (Signed)
Pre visit review using our clinic review tool, if applicable. No additional management support is needed unless otherwise documented below in the visit note. 

## 2016-09-25 NOTE — Assessment & Plan Note (Signed)
New acute problem. Appears musculoskeletal in etiology. Treating the diclofenac and tramadol. Sending to physical therapy.

## 2016-09-25 NOTE — Progress Notes (Signed)
Subjective:  Patient ID: [name], female    DOB: 30-Jan-1946  Age: 70 y.o. MRN: [id number]  CC: Back pain  HPI:  70 year old female presents with complaints of back pain.  Patient reports that she has been experiencing left low back pain for the past week. It is been worsening. Tender to palpation. Worse with sitting and lying down. She's been taking meloxicam and Advil with no improvement. No reports of radicular symptoms. No recent fall, trauma, injury. No other complaints or issues at this time.  Social Hx   Social History   Social History  . Marital status: Married    Spouse name: N/A  . Number of children: 3  . Years of education: N/A   Occupational History  . Self Employed - Hairdresser    Social History Main Topics  . Smoking status: Never Smoker  . Smokeless tobacco: Never Used  . Alcohol use Yes     Comment: Wine nightly  . Drug use: No  . Sexual activity: Not Asked   Other Topics Concern  . None   Social History Narrative   Lives in [place]. Works at [employer] in [employer]      Daily Caffeine Use:  2 coffee   Regular Exercise -  NO            Review of Systems  Constitutional: Negative.   Musculoskeletal: Positive for back pain.   Objective:  BP 130/76 (BP Location: Left Arm, Patient Position: Sitting, Cuff Size: Normal)   Pulse 83   Temp 98.6 F (37 C) (Oral)   Resp 16   Wt 139 lb 6 oz (63.2 kg)   SpO2 97%   BMI 27.22 kg/m   BP/Weight 09/25/2016 08/30/2015 0000000  Systolic BP [id number] [id number]  Diastolic BP 76 75 77  Wt. (Lbs) 139.38 133.25 134.25  BMI 27.22 26.02 26.67   Physical Exam  Constitutional: She is oriented to person, place, and time. She appears well-developed. No distress.  Cardiovascular: Normal rate and regular rhythm.   2/6 systolic murmur.   Pulmonary/Chest: Effort normal and breath sounds normal.  Musculoskeletal:  Left low back - tender to palpation. Negative straight leg raise.  Neurological: She is alert and  oriented to person, place, and time.  Psychiatric: She has a normal mood and affect.  Vitals reviewed.  Lab Results  Component Value Date   WBC 6.2 08/02/2015   HGB 14.6 08/02/2015   HCT 43.1 08/02/2015   PLT 286.0 08/02/2015   GLUCOSE 113 (H) 08/02/2015   CHOL 182 08/02/2015   TRIG 41.0 08/02/2015   HDL 93.20 08/02/2015   LDLDIRECT 158.6 02/24/2013   LDLCALC 81 08/02/2015   ALT 28 08/02/2015   AST 27 08/02/2015   NA 140 08/02/2015   K 4.2 08/02/2015   CL 101 08/02/2015   CREATININE 0.56 08/02/2015   BUN 13 08/02/2015   CO2 31 08/02/2015   MICROALBUR 1.2 06/29/2014    Assessment & Plan:   Problem List Items Addressed This Visit    Low back pain    New acute problem. Appears musculoskeletal in etiology. Treating the diclofenac and tramadol. Sending to physical therapy.      Relevant Medications   diclofenac (VOLTAREN) 75 MG EC tablet   traMADol (ULTRAM) 50 MG tablet   Other Relevant Orders   Ambulatory referral to Physical Therapy      Meds ordered this encounter  Medications  . diclofenac (VOLTAREN) 75 MG EC tablet    Sig:  Take 1 tablet (75 mg total) by mouth 2 (two) times daily.    Dispense:  30 tablet    Refill:  0  . traMADol (ULTRAM) 50 MG tablet    Sig: Take 1 tablet (50 mg total) by mouth every 12 (twelve) hours as needed.    Dispense:  30 tablet    Refill:  0   Follow-up: PRN  [place]

## 2016-10-02 DIAGNOSIS — H2513 Age-related nuclear cataract, bilateral: Secondary | ICD-10-CM | POA: Diagnosis not present

## 2016-10-03 ENCOUNTER — Other Ambulatory Visit: Payer: Self-pay | Admitting: Family Medicine

## 2016-10-03 NOTE — Telephone Encounter (Signed)
Pt called requesting a refill on traMADol (ULTRAM) 50 MG tablet, and diclofenac (VOLTAREN) 75 MG EC tablet to get her through to her appt on 12/11. Pt states that her back is still hurting. Please advise, thank you!  [place], [place] - [place]  Call pt @ [phone]

## 2016-10-03 NOTE — Telephone Encounter (Signed)
Tramadol refilled 09/25/16. voltaren refilled 09/25/16. Pt hasaa upcoming appt on 10/09/16.   **RX changed to fit that time period until appt**

## 2016-10-03 NOTE — Telephone Encounter (Signed)
Okay to refill? Last filled on 09/25/16 for #30 and no refills. Last OV: 09/25/16. Next OV: 10/09/16.

## 2016-10-09 ENCOUNTER — Encounter: Payer: Medicare PPO | Admitting: Family Medicine

## 2016-10-12 ENCOUNTER — Ambulatory Visit: Payer: Medicare PPO | Attending: Family Medicine | Admitting: Physical Therapy

## 2016-10-12 DIAGNOSIS — M2560 Stiffness of unspecified joint, not elsewhere classified: Secondary | ICD-10-CM | POA: Diagnosis not present

## 2016-10-12 DIAGNOSIS — M25552 Pain in left hip: Secondary | ICD-10-CM | POA: Diagnosis not present

## 2016-10-12 DIAGNOSIS — M545 Low back pain, unspecified: Secondary | ICD-10-CM

## 2016-10-12 DIAGNOSIS — M2569 Stiffness of other specified joint, not elsewhere classified: Secondary | ICD-10-CM

## 2016-10-12 NOTE — Therapy (Signed)
[facility] [address] [place], [address] Phone: [phone]   Fax:  [phone]  Physical Therapy Treatment  Patient Details  Name: [name] MRN: [id number] Date of Birth: 1946/05/21 Referring Provider: Jene Every  Encounter Date: 10/12/2016      PT End of Session - 10/12/16 0901    Visit Number 1   Number of Visits 13   Date for PT Re-Evaluation 11/23/16   PT Start Time 0810   PT Stop Time 0900   PT Time Calculation (min) 50 min      Past Medical History:  Diagnosis Date  . Asthma   . H/O exercise stress test    at 59 years age  . Heart murmur   . Hyperlipidemia   . Hypertension   . Jaundice    In High school  . Jaundice    hx     Past Surgical History:  Procedure Laterality Date  . TUBAL LIGATION  1980    There were no vitals filed for this visit.      Subjective Assessment - 10/12/16 0815    Subjective Pt reports "horrible back pain" on L side. She has had pain similar to this in the past which was primarily on her R side. Pain is "nagging" Pain is worst with sitting or laying down. Standing is non-painful. Driving or riding in a car is highly painful. Pt is not able to sit for any period of time due to pain unless she has taken medication. Pain has been for the past 4 weeks. Pain has worsened. Only able to sleep with medication.   How long can you sit comfortably? immediate severe pain   Diagnostic tests none recently   Patient Stated Goals decr. pain   Currently in Pain? Yes   Pain Score 2    Pain Location Back   Pain Orientation Left   Pain Descriptors / Indicators Nagging            [facility] PT Assessment - 10/12/16 0001      Assessment   Medical Diagnosis acute left sided back pain with no sciatica   Referring Provider Lacinda Axon, J   Prior Therapy multiple bouts     Precautions   Precautions None     Restrictions   Weight Bearing Restrictions No     Balance  Screen   Has the patient fallen in the past 6 months No   Has the patient had a decrease in activity level because of a fear of falling?  No   Is the patient reluctant to leave their home because of a fear of falling?  No     Home Environment   Living Environment Private residence   Living Arrangements Spouse/significant other   Type of [place]     Prior Function   Level of [place] Full time employment   Vocation Requirements Standing, leaning, lifting   Leisure Playing with grandchildren.     ROM / Strength   AROM / PROM / Strength AROM;Strength     AROM   Overall AROM Comments pain and limitation with L rotation and L sidebend, all others WNL     Strength   Overall Strength Comments Pain with strength testing.     Palpation   Spinal mobility significant incr. tone in L lower lumbar paraspinals   Palpation comment highly irritated L glute complex, superior more than inferior.  Objective: Extensive STM performed on L paraspinals, glutes, erector spinae. 20 min in position to address lateral shift.  Trigger point dry needling performed in same region (no charge) following informed consent of risks and benefits of dry needling. (no charge)  Followign this pt able rotate side to side pain free.  L UPAS grade I 1x1 min at L4-S1.  PT educated pt on avoiding flexion and improving extension/performing extension exercises at home to maintain improvement in pain with PT.                  PT Education - 02-Nov-2016 0900    Education provided Yes   Education Details course of PT, avoiding flexion and improving extension    Person(s) Educated Patient   Methods Explanation   Comprehension Verbalized understanding             PT Long Term Goals - November 02, 2016 0906      PT LONG TERM GOAL #1   Title pt will be able to verbalize principles of extension/directional preference    Baseline is not familiar with this    Time 6   Period Weeks   Status New     PT LONG TERM GOAL #2   Title Pt will be able to sit x10 min with no c/o back pain to drive to work pain free.   Baseline pain immediately with driving   Time 6   Period Weeks   Status New     PT LONG TERM GOAL #3   Title Pt will be able to sleep throughout the night with no medication   Baseline pt requires medication to sleep.   Time 6   Period Weeks   Status New               Plan - Nov 02, 2016 0902    Clinical Impression Statement Pt is a pleasant 70 y/o female with c/o severe acute L sided low back pain. She is unable to sleep due to pain unless she takes medication. Pt presents with considerable trigger points throughout glutes and paraspinals, and appears to have a directional preference for extension. Pt also has a moderate right lateral shift.   Rehab Potential Good   PT Frequency 2x / week   PT Duration 6 weeks   PT Treatment/Interventions Therapeutic exercise;Manual techniques;Patient/family education;Neuromuscular re-education;Dry needling;ADLs/Self Care Home Management   PT Home Exercise Plan lumbar rotation, lumbar lateral flexion   Consulted and Agree with Plan of Care Patient      Patient will benefit from skilled therapeutic intervention in order to improve the following deficits and impairments:  Hypomobility, Pain, Decreased range of motion, Decreased strength  Visit Diagnosis: Acute left-sided low back pain without sciatica - Plan: PT plan of care cert/re-cert  Back stiffness - Plan: PT plan of care cert/re-cert  Pain in left hip - Plan: PT plan of care cert/re-cert       G-Codes - 02-Nov-2016 0911    Functional Assessment Tool Used NPRS, clinical judgment   Functional Limitation Changing and maintaining body position   Mobility: Walking and Moving Around Current Status [id number]) At least 60 percent but less than 80 percent impaired, limited or restricted   Mobility: Walking and Moving Around Goal Status [id number])  At least 1 percent but less than 20 percent impaired, limited or restricted      Problem List Patient Active Problem List   Diagnosis Date Noted  . Low back pain 09/25/2016  . Family history of ischemic heart  disease and other diseases of the circulatory system 08/30/2015  . Ophthalmoplegic migraine, not intractable 12/28/2014  . Hypertension   . Bunion, right foot 02/24/2013  . Allergic rhinitis 06/24/2012  . Restless leg syndrome 11/13/2011  . Hyperlipidemia 10/05/2011    Fisher,Benjamin PT DPT 10/12/2016, 9:13 AM  [facility] [address] [place], [address] Phone: [phone]   Fax:  [phone]  Name: [name] MRN: [id number] Date of Birth: April 02, 1946

## 2016-10-16 ENCOUNTER — Emergency Department
Admission: EM | Admit: 2016-10-16 | Discharge: 2016-10-16 | Disposition: A | Discharge status: Home / Self Care | Payer: Medicare PPO | Attending: Emergency Medicine | Admitting: Emergency Medicine

## 2016-10-16 ENCOUNTER — Ambulatory Visit: Payer: Medicare PPO

## 2016-10-16 ENCOUNTER — Other Ambulatory Visit: Payer: Self-pay | Admitting: Family Medicine

## 2016-10-16 ENCOUNTER — Encounter: Payer: Self-pay | Admitting: Emergency Medicine

## 2016-10-16 DIAGNOSIS — J45909 Unspecified asthma, uncomplicated: Secondary | ICD-10-CM | POA: Diagnosis not present

## 2016-10-16 DIAGNOSIS — M545 Low back pain, unspecified: Secondary | ICD-10-CM

## 2016-10-16 DIAGNOSIS — I10 Essential (primary) hypertension: Secondary | ICD-10-CM | POA: Diagnosis not present

## 2016-10-16 DIAGNOSIS — M25552 Pain in left hip: Secondary | ICD-10-CM | POA: Diagnosis not present

## 2016-10-16 DIAGNOSIS — M5137 Other intervertebral disc degeneration, lumbosacral region: Secondary | ICD-10-CM

## 2016-10-16 DIAGNOSIS — M5136 Other intervertebral disc degeneration, lumbar region: Secondary | ICD-10-CM | POA: Insufficient documentation

## 2016-10-16 DIAGNOSIS — M2560 Stiffness of unspecified joint, not elsewhere classified: Secondary | ICD-10-CM | POA: Diagnosis not present

## 2016-10-16 DIAGNOSIS — Z79899 Other long term (current) drug therapy: Secondary | ICD-10-CM | POA: Insufficient documentation

## 2016-10-16 MED ORDER — DEXAMETHASONE SODIUM PHOSPHATE 10 MG/ML IJ SOLN
10.0000 mg | Freq: Once | INTRAMUSCULAR | Status: AC
  Administered 2016-10-16: 10 mg via INTRAMUSCULAR
  Filled 2016-10-16: qty 1

## 2016-10-16 MED ORDER — CYCLOBENZAPRINE HCL 10 MG PO TABS: 10.0000 mg | ORAL_TABLET | Freq: Three times a day (TID) | ORAL | 0 refills | Status: DC | PRN

## 2016-10-16 MED ORDER — ONDANSETRON 8 MG PO TBDP
8.0000 mg | ORAL_TABLET | Freq: Once | ORAL | Status: AC
  Administered 2016-10-16: 8 mg via ORAL
  Filled 2016-10-16: qty 1

## 2016-10-16 MED ORDER — OXYCODONE-ACETAMINOPHEN 5-325 MG PO TABS: 1.0000 | ORAL_TABLET | Freq: Four times a day (QID) | ORAL | 0 refills | Status: DC | PRN

## 2016-10-16 MED ORDER — ONDANSETRON 4 MG PO TBDP: 4.0000 mg | ORAL_TABLET | Freq: Three times a day (TID) | ORAL | 0 refills | Status: DC | PRN

## 2016-10-16 MED ORDER — OXYCODONE-ACETAMINOPHEN 5-325 MG PO TABS
1.0000 | ORAL_TABLET | Freq: Once | ORAL | Status: AC
  Administered 2016-10-16: 1 via ORAL
  Filled 2016-10-16: qty 1

## 2016-10-16 MED ORDER — PREDNISONE 10 MG PO TABS: 10.0000 mg | ORAL_TABLET | Freq: Every day | ORAL | 0 refills | Status: DC

## 2016-10-16 NOTE — ED Triage Notes (Signed)
Patient ambulatory to triage with steady gait, without difficulty or distress noted; pt reports left lower back pain radiating down left leg last few wks; st hx of same

## 2016-10-16 NOTE — Therapy (Signed)
[facility] [address] [place], [address] Phone: [phone]   Fax:  [phone]  Physical Therapy Treatment  Patient Details  Name: [name] MRN: [id number] Date of Birth: January 01, 1946 Referring Provider: Jene Every  Encounter Date: 10/16/2016      PT End of Session - 10/16/16 2129    Visit Number 2   Number of Visits 13   Date for PT Re-Evaluation 11/23/16   PT Start Time [id number]   PT Stop Time 1640   PT Time Calculation (min) 30 min   Activity Tolerance Patient limited by pain   Behavior During Therapy Restless  Tearful      Past Medical History:  Diagnosis Date  . Asthma   . H/O exercise stress test    at 36 years age  . Heart murmur   . Hyperlipidemia   . Hypertension   . Jaundice    In High school  . Jaundice    hx     Past Surgical History:  Procedure Laterality Date  . TUBAL LIGATION  1980    There were no vitals filed for this visit.      Subjective Assessment - 10/16/16 1609    Subjective Pt reports that her back is "terrible" right now. Dry needling helped very briefly following last session but returned very shortly after session. She is almost out of her Tramadol and went to her MD office but they stated they could not refill her medication without an office visit. Per patient MD was unable to see her on this date. She reports pain as "12/10" upon arrival when getting out of the car but states that it decreases to 4/10 once she is standing. Pt is tearful due to the pain at this time. She currently denies any radicular or radiating symptoms into LLE. She points to pain in L lower back near SI joint.    How long can you sit comfortably? immediate severe pain   Diagnostic tests none recently   Patient Stated Goals decr. pain   Currently in Pain? Yes   Pain Score 10-Worst pain ever  "12/10" when getting out of car, 4/10 once standing   Pain Location Back   Pain Orientation  Left   Pain Type Acute pain   Pain Radiating Towards Denies LLE radicular symptoms or radiating pain into L hip or LLE          TREATMENT  Electrical Stimulation  IFC applied to low back at patient tolerable intensity, increased after 5 minutes x 15 minutes total with moist heat pack applied to left lower back, pt reports gradually decreasing pain. She struggles to get comfortable and ends up in a prone position with L hip partially flexed and externally rotated;  Manual Therapy Soft tissue mobilization to L lower back including QL, multifidis, and lumbar paraspinals. Pelvic distraction. Pt reports gradually decreasing pain.  Pt reports decrease in pain to 1/10 following session on this date. Session is limited due to high levels of pain currently.                      PT Education - 10/16/16 2129    Education provided Yes   Education Details plan of care   Person(s) Educated Patient   Methods Explanation   Comprehension Verbalized understanding             PT Long Term Goals - 10/12/16 [phone]  PT LONG TERM GOAL #1   Title pt will be able to verbalize principles of extension/directional preference    Baseline is not familiar with this   Time 6   Period Weeks   Status New     PT LONG TERM GOAL #2   Title Pt will be able to sit x10 min with no c/o back pain to drive to work pain free.   Baseline pain immediately with driving   Time 6   Period Weeks   Status New     PT LONG TERM GOAL #3   Title Pt will be able to sleep throughout the night with no medication   Baseline pt requires medication to sleep.   Time 6   Period Weeks   Status New               Plan - 10/16/16 2129    Clinical Impression Statement Pt reports severe pain upon arrival today. She reports her MD office refusing to refill Tramadol with office visit but cannot see her toda. She is tearful during session due to the pain. Pt reports "12/10" pain upon arrival when  getting out of car which decreases to "4/10" in standing. At end of session following electrical stimulation and massage pt reports 1/10 pain. Session is limited today due to pain. Pt encouraged to continue HEP and follow-up as scheduled.    Rehab Potential Good   PT Frequency 2x / week   PT Duration 6 weeks   PT Treatment/Interventions Therapeutic exercise;Manual techniques;Patient/family education;Neuromuscular re-education;Dry needling;ADLs/Self Care Home Management   PT Home Exercise Plan lumbar rotation, lumbar lateral flexion   Consulted and Agree with Plan of Care Patient      Patient will benefit from skilled therapeutic intervention in order to improve the following deficits and impairments:  Hypomobility, Pain, Decreased range of motion, Decreased strength  Visit Diagnosis: Acute left-sided low back pain without sciatica     Problem List Patient Active Problem List   Diagnosis Date Noted  . Low back pain 09/25/2016  . Family history of ischemic heart disease and other diseases of the circulatory system 08/30/2015  . Ophthalmoplegic migraine, not intractable 12/28/2014  . Hypertension   . Bunion, right foot 02/24/2013  . Allergic rhinitis 06/24/2012  . Restless leg syndrome 11/13/2011  . Hyperlipidemia 10/05/2011   Phillips Grout PT, DPT   Tanor Glaspy 10/16/2016, 9:50 PM  [facility] [address], [place], [address] Phone: [phone]   Fax:  [phone]  Name: [name] MRN: [id number] Date of Birth: 1946/02/03

## 2016-10-16 NOTE — Telephone Encounter (Signed)
Refilled 09/25/16. Pt last seen 09/25/16. Please advise?

## 2016-10-16 NOTE — ED Provider Notes (Signed)
[facility] Emergency Department Provider Note  ____________________________________________  Time seen: Approximately 9:19 PM  I have reviewed the triage vital signs and the nursing notes.   HISTORY  Chief Complaint Back Pain    HPI [name] is a 70 y.o. female who presents emergency department complaining of left lower back pain. Patient states that she has had a history of left lower back pain ongoing times the past month. Patient was seen by her primary care and sent to physical therapy for this complaint. Patient reports that she went to physical therapy today and after her session she has had an increase in her lower back pain. Patient states that is uncomfortable to sit and lay down. She denies any radiation into the lower extremities. She denies any bowel or bladder dysfunction, saddle anesthesia, paresthesias. No trauma precipitates this injury. Patient reports that she has known arthritis to her lower back. Patient has been on meloxicam, Advil, as needed on a trampoline. Patient reports that Ultram does subside symptoms somewhat but does not fully alleviate. She reports at anti-inflammatories also helped somewhat but did not fully alleviate his symptoms.   Past Medical History:  Diagnosis Date  . Asthma   . H/O exercise stress test    at 69 years age  . Heart murmur   . Hyperlipidemia   . Hypertension   . Jaundice    In High school  . Jaundice    hx     Patient Active Problem List   Diagnosis Date Noted  . Low back pain 09/25/2016  . Family history of ischemic heart disease and other diseases of the circulatory system 08/30/2015  . Ophthalmoplegic migraine, not intractable 12/28/2014  . Hypertension   . Bunion, right foot 02/24/2013  . Allergic rhinitis 06/24/2012  . Restless leg syndrome 11/13/2011  . Hyperlipidemia 10/05/2011    Past Surgical History:  Procedure Laterality Date  . TUBAL LIGATION  1980    Prior to Admission  medications   Medication Sig Start Date End Date Taking? Authorizing Provider  ALPRAZolam (XANAX) 0.25 MG tablet Take 1 tablet (0.25 mg total) by mouth 2 (two) times daily as needed for anxiety. 07/08/14   Jackolyn Confer, MD  amLODipine (NORVASC) 5 MG tablet Take 1 tablet (5 mg total) by mouth daily. 11/03/15   Jackolyn Confer, MD  atorvastatin (LIPITOR) 20 MG tablet TAKE 1 TABLET EVERY DAY USUALLY IN THE EVENING 11/03/15   Jackolyn Confer, MD  cyclobenzaprine (FLEXERIL) 10 MG tablet Take 1 tablet (10 mg total) by mouth 3 (three) times daily as needed for muscle spasms. 10/16/16   Charline Bills Nakima Fluegge, PA-C  diclofenac (VOLTAREN) 75 MG EC tablet Take 1 tablet (75 mg total) by mouth 2 (two) times daily. 09/25/16   Coral Spikes, DO  fexofenadine-pseudoephedrine (ALLEGRA-D ALLERGY & CONGESTION) 180-240 MG 24 hr tablet Take 1 tablet by mouth daily. 08/30/15   Jackolyn Confer, MD  fluticasone [facility]) 50 MCG/ACT nasal spray USE 2 SPRAYS IN EACH NOSTRIL EVERY DAY 09/28/14   Jackolyn Confer, MD  methocarbamol (ROBAXIN) 500 MG tablet Take 1 tablet (500 mg total) by mouth every 8 (eight) hours as needed for muscle spasms. 11/12/14   Jackolyn Confer, MD  ondansetron (ZOFRAN-ODT) 4 MG disintegrating tablet Take 1 tablet (4 mg total) by mouth every 8 (eight) hours as needed for nausea or vomiting. 10/16/16   Charline Bills Jennier Schissler, PA-C  oxyCODONE-acetaminophen (ROXICET) 5-325 MG tablet Take 1 tablet by mouth  every 6 (six) hours as needed for severe pain. 10/16/16   Charline Bills Grainne Knights, PA-C  predniSONE (DELTASONE) 10 MG tablet Take 1 tablet (10 mg total) by mouth daily. 10/16/16   Charline Bills Sharonica Kraszewski, PA-C  rOPINIRole (REQUIP) 1 MG tablet Take 2 tablets (2 mg total) by mouth at bedtime. 11/05/15   Jackolyn Confer, MD  traMADol (ULTRAM) 50 MG tablet Take 1 tablet (50 mg total) by mouth every 12 (twelve) hours as needed. 09/25/16   Coral Spikes, DO    Allergies Codeine and Penicillins  Family History   Problem Relation Age of Onset  . Heart attack Mother   . Heart disease Mother 81  . Heart attack Father   . Heart disease Father 47  . Diabetes Sister   . Heart disease Brother   . Heart disease Brother   . Diabetes Maternal Grandmother   . Breast cancer Other   . Colon cancer Neg Hx     Social History Social History  Substance Use Topics  . Smoking status: Never Smoker  . Smokeless tobacco: Never Used  . Alcohol use Yes     Comment: Wine nightly     Review of Systems  Constitutional: No fever/chills Cardiovascular: no chest pain. Respiratory: no cough. No SOB. Gastrointestinal: No abdominal pain.  No nausea, no vomiting.  No diarrhea.  No constipation. Genitourinary: Negative for dysuria. No hematuria Musculoskeletal: Positive for left lower back pain. Skin: Negative for rash, abrasions, lacerations, ecchymosis. Neurological: Negative for headaches, focal weakness or numbness. 10-point ROS otherwise negative.  ____________________________________________   PHYSICAL EXAM:  VITAL SIGNS: ED Triage Vitals  Enc Vitals Group     BP 10/16/16 2026 (!) 172/100     Pulse Rate 10/16/16 2026 84     Resp 10/16/16 2026 18     Temp 10/16/16 2026 98.2 F (36.8 C)     Temp Source 10/16/16 2026 Oral     SpO2 10/16/16 2026 97 %     Weight 10/16/16 2024 139 lb (63 kg)     Height 10/16/16 2024 4\' 11"  (1.499 m)     Head Circumference --      Peak Flow --      Pain Score 10/16/16 2024 10     Pain Loc --      Pain Edu? --      Excl. in [place]? --      Constitutional: Alert and oriented. Well appearing and in no acute distress. Eyes: Conjunctivae are normal. PERRL. EOMI. Head: Atraumatic. Neck: No stridor.    Cardiovascular: Normal rate, regular rhythm. Normal S1 and S2.  Good peripheral circulation. Respiratory: Normal respiratory effort without tachypnea or retractions. Lungs CTAB. Good air entry to the bases with no decreased or absent breath sounds. Gastrointestinal: Bowel  sounds 4 quadrants. Soft and nontender to palpation. No guarding or rigidity. No palpable masses. No distention. No CVA tenderness. Musculoskeletal: No deformities to spine but inspection. Full range of motion to spine. Patient is tender to palpation left sided paraspinal muscle group. No midline tenderness. No palpable abnormality to midline. Mild spasms are appreciated left paraspinal muscle group. No tenderness to palpation over bilateral sciatic notches. Negative straight leg raise bilaterally. Dorsalis pedis pulse intact bilateral lower extremity's. Sensation intact and equal lower extremities. Neurologic:  Normal speech and language. No gross focal neurologic deficits are appreciated.  Skin:  Skin is warm, dry and intact. No rash noted. Psychiatric: Mood and affect are normal. Speech and behavior are normal. Patient  exhibits appropriate insight and judgement.   ____________________________________________   LABS (all labs ordered are listed, but only abnormal results are displayed)  Labs Reviewed - No data to display ____________________________________________  EKG   ____________________________________________  RADIOLOGY   No results found.  ____________________________________________    PROCEDURES  Procedure(s) performed:    Procedures    Medications  dexamethasone (DECADRON) injection 10 mg (not administered)  oxyCODONE-acetaminophen (PERCOCET/ROXICET) 5-325 MG per tablet 1 tablet (not administered)  ondansetron (ZOFRAN-ODT) disintegrating tablet 8 mg (not administered)     ____________________________________________   INITIAL IMPRESSION / ASSESSMENT AND PLAN / ED COURSE  Pertinent labs & imaging results that were available during my care of the patient were reviewed by me and considered in my medical decision making (see chart for details).  Review of the [place] CSRS was performed in accordance of the [place] prior to dispensing any controlled  drugs.  Clinical Course     Patient's diagnosis is consistent with Degenerative disc disease with lumbago. Patient does not have sciatic symptoms. No concerning findings of bowel or bladder dysfunction, saddle anesthesia, paresthesias. Patient has had ongoing lower back pain 1 month. After vigorous physical therapy, pain has increased. Again, no concerning findings at this time. Patient has been on long-term anti-inflammatory regimen. As such, patient will be given steroids, muscle relaxer. Patient is also given limited pain medication to take at nighttime so that she may sleep due to pain. If symptoms persist, she will follow-up with orthopedics for further management.  Patient is given strict ED precautions to return to the ED for any worsening or new symptoms.     ____________________________________________  FINAL CLINICAL IMPRESSION(S) / ED DIAGNOSES  Final diagnoses:  DDD (degenerative disc disease), lumbosacral  Acute left-sided low back pain without sciatica      NEW MEDICATIONS STARTED DURING THIS VISIT:  New Prescriptions   CYCLOBENZAPRINE (FLEXERIL) 10 MG TABLET    Take 1 tablet (10 mg total) by mouth 3 (three) times daily as needed for muscle spasms.   ONDANSETRON (ZOFRAN-ODT) 4 MG DISINTEGRATING TABLET    Take 1 tablet (4 mg total) by mouth every 8 (eight) hours as needed for nausea or vomiting.   OXYCODONE-ACETAMINOPHEN (ROXICET) 5-325 MG TABLET    Take 1 tablet by mouth every 6 (six) hours as needed for severe pain.   PREDNISONE (DELTASONE) 10 MG TABLET    Take 1 tablet (10 mg total) by mouth daily.        This chart was dictated using voice recognition software/Dragon. Despite best efforts to proofread, errors can occur which can change the meaning. Any change was purely unintentional.    Darletta Moll, PA-C 10/16/16 2210    Carrie Mew, MD 10/17/16 2242

## 2016-10-17 NOTE — Telephone Encounter (Signed)
faxed

## 2016-10-26 ENCOUNTER — Encounter: Payer: Medicare PPO | Admitting: Physical Therapy

## 2016-11-01 ENCOUNTER — Ambulatory Visit: Payer: Medicare PPO | Attending: Family Medicine | Admitting: Physical Therapy

## 2016-11-01 DIAGNOSIS — M2560 Stiffness of unspecified joint, not elsewhere classified: Secondary | ICD-10-CM | POA: Insufficient documentation

## 2016-11-01 DIAGNOSIS — M545 Low back pain, unspecified: Secondary | ICD-10-CM

## 2016-11-01 NOTE — Therapy (Signed)
[facility] [address] [place], [address] Phone: [phone]   Fax:  [phone]  Physical Therapy Treatment  Patient Details  Name: [name] MRN: [id number] Date of Birth: Sep 29, 1946 Referring Provider: Jene Every  Encounter Date: 11/01/2016      PT End of Session - 11/01/16 0804    Visit Number 3   Number of Visits 13   Date for PT Re-Evaluation 11/23/16   PT Start Time 0724   PT Stop Time 0802   PT Time Calculation (min) 38 min   Activity Tolerance Patient limited by pain   Behavior During Therapy Restless  Tearful      Past Medical History:  Diagnosis Date  . Asthma   . H/O exercise stress test    at 17 years age  . Heart murmur   . Hyperlipidemia   . Hypertension   . Jaundice    In High school  . Jaundice    hx     Past Surgical History:  Procedure Laterality Date  . TUBAL LIGATION  1980    There were no vitals filed for this visit.      Subjective Assessment - 11/01/16 0727    Subjective Pt reports her back is continuing to be highly irritated. She has stopped medication however pain came back yesterday when she was in a car.    How long can you sit comfortably? immediate severe pain   Diagnostic tests none recently   Patient Stated Goals decr. pain   Currently in Pain? Yes   Pain Score 4    Pain Location Back              Objective: Pt was positioned in hook lying to facilitate paraspinal relaxation.  Pt was then instructed to palpate paraspinals which were notably contracted.  Passive knee flops were then performed in this position for one minute, followed by active for one minute. Pain was noted in the beginning of this exercise that subsided by the end.  Pt then performed posterior tilts with verbal cuing on proper breathing techniques to further facilitate paraspinal relaxation for five minutes.  Pt then performed standing lumbar extension x10 which  she noted decreased her discomfort. Instructed to perform 1 set per 2 hrs when awake.  Patient verbalized that she understood these were techniques that she could do to decrease pain at home.  Patients posture while sitting in the car was then observed and was noted to be adequate.                         PT Education - 11/01/16 0732    Education provided Yes   Education Details HEP. Proper breathing techniques, Relaxation techniques, Avoidance of uncessary irritating postures, encouraged tolerable physical activity like walking   Person(s) Educated Patient   Methods Explanation;Tactile cues;Verbal cues   Comprehension Verbalized understanding;Returned demonstration;Tactile cues required;Verbal cues required;Need further instruction             PT Long Term Goals - 10/12/16 0906      PT LONG TERM GOAL #1   Title pt will be able to verbalize principles of extension/directional preference    Baseline is not familiar with this   Time 6   Period Weeks   Status New     PT LONG TERM GOAL #2   Title Pt will be able to sit x10 min with no c/o back pain  to drive to work pain free.   Baseline pain immediately with driving   Time 6   Period Weeks   Status New     PT LONG TERM GOAL #3   Title Pt will be able to sleep throughout the night with no medication   Baseline pt requires medication to sleep.   Time 6   Period Weeks   Status New               Plan - 11/01/16 [id number]    Clinical Impression Statement Unable to assess efficacy of therapeutic intervention due to minimal acute symptoms. Session focused on education, relaxation of paraspinals, motor control. Overactivation of paraspinal musculature appears to be a contributor to her pain. Continue relaxation and breathing techniques to help pt reduce symptoms. Advance treatment when able.    Rehab Potential Good   PT Frequency 2x / week   PT Duration 6 weeks   PT Treatment/Interventions Therapeutic  exercise;Manual techniques;Patient/family education;Neuromuscular re-education;Dry needling;ADLs/Self Care Home Management   PT Home Exercise Plan lumbar rotation, lumbar lateral flexion   Consulted and Agree with Plan of Care Patient      Patient will benefit from skilled therapeutic intervention in order to improve the following deficits and impairments:  Hypomobility, Pain, Decreased range of motion, Decreased strength  Visit Diagnosis: Acute left-sided low back pain without sciatica     Problem List Patient Active Problem List   Diagnosis Date Noted  . Low back pain 09/25/2016  . Family history of ischemic heart disease and other diseases of the circulatory system 08/30/2015  . Ophthalmoplegic migraine, not intractable 12/28/2014  . Hypertension   . Bunion, right foot 02/24/2013  . Allergic rhinitis 06/24/2012  . Restless leg syndrome 11/13/2011  . Hyperlipidemia 10/05/2011    Schaefer,Kimberly PT DPT 11/01/2016, 11:07 AM  [facility] [address] [place], [address] Phone: [phone]   Fax:  [phone]  Name: [name] MRN: [id number] Date of Birth: Mar 11, 1946

## 2016-11-06 ENCOUNTER — Encounter: Payer: Self-pay | Admitting: Physical Therapy

## 2016-11-06 ENCOUNTER — Other Ambulatory Visit: Payer: Medicare PPO

## 2016-11-06 ENCOUNTER — Ambulatory Visit: Payer: Medicare PPO | Admitting: Physical Therapy

## 2016-11-06 ENCOUNTER — Other Ambulatory Visit: Payer: Self-pay | Admitting: Internal Medicine

## 2016-11-06 ENCOUNTER — Ambulatory Visit: Payer: Medicare PPO

## 2016-11-06 DIAGNOSIS — M2560 Stiffness of unspecified joint, not elsewhere classified: Secondary | ICD-10-CM

## 2016-11-06 DIAGNOSIS — E785 Hyperlipidemia, unspecified: Secondary | ICD-10-CM | POA: Diagnosis not present

## 2016-11-06 DIAGNOSIS — M5441 Lumbago with sciatica, right side: Secondary | ICD-10-CM | POA: Diagnosis not present

## 2016-11-06 DIAGNOSIS — E288 Other ovarian dysfunction: Secondary | ICD-10-CM | POA: Diagnosis not present

## 2016-11-06 DIAGNOSIS — M545 Low back pain, unspecified: Secondary | ICD-10-CM

## 2016-11-06 DIAGNOSIS — M2569 Stiffness of other specified joint, not elsewhere classified: Secondary | ICD-10-CM

## 2016-11-06 DIAGNOSIS — I10 Essential (primary) hypertension: Secondary | ICD-10-CM | POA: Diagnosis not present

## 2016-11-06 DIAGNOSIS — G2581 Restless legs syndrome: Secondary | ICD-10-CM | POA: Diagnosis not present

## 2016-11-06 DIAGNOSIS — K219 Gastro-esophageal reflux disease without esophagitis: Secondary | ICD-10-CM

## 2016-11-06 NOTE — Therapy (Signed)
[facility] [address] [place], [address] Phone: [phone]   Fax:  [phone]  Physical Therapy Treatment  Patient Details  Name: [name] MRN: [id number] Date of Birth: 09-26-1946 Referring Provider: Jene Every  Encounter Date: 11/06/2016      PT End of Session - 11/06/16 1637    Visit Number 4   Number of Visits 13   Date for PT Re-Evaluation 11/23/16   PT Start Time 1510   PT Stop Time 5170   PT Time Calculation (min) 38 min   Activity Tolerance Patient tolerated treatment well      Past Medical History:  Diagnosis Date  . Asthma   . H/O exercise stress test    at 67 years age  . Heart murmur   . Hyperlipidemia   . Hypertension   . Jaundice    In High school  . Jaundice    hx     Past Surgical History:  Procedure Laterality Date  . TUBAL LIGATION  1980    There were no vitals filed for this visit.      Subjective Assessment - 11/06/16 1514    Subjective Pt states her back is doing much better and that she is very pleased with the progress. She states that even with a very busy weekend she did not have much pain, if any at all. She states that she has been working on her posture and been compliant with HEP.   Pertinent History Pt reports onset of pain 08/2014, idiopathically. She has had two bouts of PT since that time. One focused on strengthening, the other did dry needling. Dry needling improved pt considerably.    How long can you sit comfortably? immediate severe pain   Diagnostic tests none recently   Patient Stated Goals decr. pain       Objective: CPA L1-L5 grade 1 19mn per segment, STM lumbar paraspinal musculature to decrease nervous system tone. Pt responded well to treatment and states she is "very thankful" with how much better she is feeling.  Educated pt on monitoring symptoms for need to return to PT. Also encouraged pt that although physical activity can  help broadly with pain, it is not possible to fully "bulletproof" herself from future instances of pain. Reinforced the idea that when pain arises it should be dealt with quickly using the principles taught throughout PT sessions including avoiding noxious positions (sitting) and facilitating movements which are easing symptoms.                          PT Education - 11/06/16 1636    Education provided Yes   Education Details Continuance of HEP for maintenance of condition   Person(s) Educated Patient   Methods Explanation   Comprehension Verbalized understanding             PT Long Term Goals - 11/06/16 1719      PT LONG TERM GOAL #1   Title pt will be able to verbalize principles of extension/directional preference    Baseline is not familiar with this   Time 6   Period Weeks   Status Achieved     PT LONG TERM GOAL #2   Title Pt will be able to sit x10 min with no c/o back pain to drive to work pain free.   Baseline pain immediately with driving   Time 6   Period Weeks  Status Achieved     PT LONG TERM GOAL #3   Title Pt will be able to sleep throughout the night with no medication   Baseline pt requires medication to sleep.   Time 6   Period Weeks   Status Achieved               Plan - 11/06/16 1714    Clinical Impression Statement At this time pt appears to be appropriate for d/c as she has met all goals. 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 Session focused on self management and home care techniques. All pt goals have been met.    Rehab Potential Good   PT Frequency 2x / week   PT Duration 6 weeks      Patient will benefit from skilled therapeutic intervention in order to improve the following deficits and impairments:  Hypomobility, Pain, Decreased range of motion, Decreased strength  Visit Diagnosis: Acute left-sided low back pain without  sciatica  Back stiffness     Problem List Patient Active Problem List   Diagnosis Date Noted  . Low back pain 09/25/2016  . Family history of ischemic heart disease and other diseases of the circulatory system 08/30/2015  . Ophthalmoplegic migraine, not intractable 12/28/2014  . Hypertension   . Bunion, right foot 02/24/2013  . Allergic rhinitis 06/24/2012  . Restless leg syndrome 11/13/2011  . Hyperlipidemia 10/05/2011    Naftali Carchi PT DPT 11/06/2016, 5:24 PM  [facility] [address] [place], [address] Phone: [phone]   Fax:  [phone]  Name: [name] MRN: [id number] Date of Birth: [birth year]

## 2016-11-08 ENCOUNTER — Ambulatory Visit: Payer: Medicare PPO | Admitting: Physical Therapy

## 2016-11-13 ENCOUNTER — Encounter: Payer: Medicare PPO | Admitting: Physical Therapy

## 2016-11-15 ENCOUNTER — Encounter: Payer: Medicare PPO | Admitting: Physical Therapy

## 2016-11-20 ENCOUNTER — Other Ambulatory Visit: Payer: Self-pay | Admitting: Internal Medicine

## 2016-11-20 ENCOUNTER — Encounter: Payer: Medicare PPO | Admitting: Physical Therapy

## 2016-11-20 ENCOUNTER — Ambulatory Visit
Admission: RE | Admit: 2016-11-20 | Discharge: 2016-11-20 | Disposition: A | Discharge status: Home / Self Care | Payer: Medicare PPO | Source: Ambulatory Visit | Attending: Family Medicine | Admitting: Family Medicine

## 2016-11-20 ENCOUNTER — Ambulatory Visit
Admission: RE | Admit: 2016-11-20 | Discharge: 2016-11-20 | Disposition: A | Discharge status: Home / Self Care | Payer: Medicare PPO | Source: Ambulatory Visit | Attending: Internal Medicine | Admitting: Internal Medicine

## 2016-11-20 DIAGNOSIS — K219 Gastro-esophageal reflux disease without esophagitis: Secondary | ICD-10-CM | POA: Insufficient documentation

## 2016-11-20 DIAGNOSIS — K449 Diaphragmatic hernia without obstruction or gangrene: Secondary | ICD-10-CM | POA: Diagnosis not present

## 2016-11-20 DIAGNOSIS — Z1239 Encounter for other screening for malignant neoplasm of breast: Secondary | ICD-10-CM

## 2016-11-20 DIAGNOSIS — E2839 Other primary ovarian failure: Secondary | ICD-10-CM

## 2016-11-20 DIAGNOSIS — Z1231 Encounter for screening mammogram for malignant neoplasm of breast: Secondary | ICD-10-CM | POA: Diagnosis not present

## 2016-11-27 ENCOUNTER — Ambulatory Visit: Payer: Medicare PPO | Admitting: Physical Therapy

## 2016-12-04 ENCOUNTER — Encounter: Payer: Medicare PPO | Admitting: Physical Therapy

## 2016-12-04 ENCOUNTER — Ambulatory Visit
Admission: RE | Admit: 2016-12-04 | Discharge: 2016-12-04 | Disposition: A | Discharge status: Home / Self Care | Payer: Medicare PPO | Source: Ambulatory Visit | Attending: Family Medicine | Admitting: Family Medicine

## 2016-12-04 DIAGNOSIS — Z780 Asymptomatic menopausal state: Secondary | ICD-10-CM | POA: Diagnosis not present

## 2016-12-04 DIAGNOSIS — M85852 Other specified disorders of bone density and structure, left thigh: Secondary | ICD-10-CM | POA: Diagnosis not present

## 2016-12-28 DIAGNOSIS — Z124 Encounter for screening for malignant neoplasm of cervix: Secondary | ICD-10-CM | POA: Diagnosis not present

## 2016-12-28 DIAGNOSIS — Z0001 Encounter for general adult medical examination with abnormal findings: Secondary | ICD-10-CM | POA: Diagnosis not present

## 2016-12-28 DIAGNOSIS — E785 Hyperlipidemia, unspecified: Secondary | ICD-10-CM | POA: Diagnosis not present

## 2016-12-28 DIAGNOSIS — I10 Essential (primary) hypertension: Secondary | ICD-10-CM | POA: Diagnosis not present

## 2016-12-28 DIAGNOSIS — I6523 Occlusion and stenosis of bilateral carotid arteries: Secondary | ICD-10-CM | POA: Diagnosis not present

## 2016-12-29 DIAGNOSIS — E785 Hyperlipidemia, unspecified: Secondary | ICD-10-CM | POA: Diagnosis not present

## 2016-12-29 DIAGNOSIS — M5441 Lumbago with sciatica, right side: Secondary | ICD-10-CM | POA: Diagnosis not present

## 2016-12-29 DIAGNOSIS — K210 Gastro-esophageal reflux disease with esophagitis: Secondary | ICD-10-CM | POA: Diagnosis not present

## 2016-12-29 DIAGNOSIS — I10 Essential (primary) hypertension: Secondary | ICD-10-CM | POA: Diagnosis not present

## 2016-12-29 DIAGNOSIS — G2581 Restless legs syndrome: Secondary | ICD-10-CM | POA: Diagnosis not present

## 2017-01-02 ENCOUNTER — Other Ambulatory Visit: Payer: Self-pay

## 2017-01-02 ENCOUNTER — Telehealth: Payer: Self-pay

## 2017-01-02 DIAGNOSIS — Z1211 Encounter for screening for malignant neoplasm of colon: Secondary | ICD-10-CM

## 2017-01-02 DIAGNOSIS — K219 Gastro-esophageal reflux disease without esophagitis: Secondary | ICD-10-CM

## 2017-01-02 NOTE — Telephone Encounter (Signed)
Gastroenterology Pre-Procedure Review  Request Date: 04/02/17 Requesting Physician: Dr. Vicente Males  PATIENT REVIEW QUESTIONS: The patient responded to the following health history questions as indicated:    1. Are you having any GI issues? no 2. Do you have a personal history of Polyps? no 3. Do you have a family history of Colon Cancer or Polyps? no 4. Diabetes Mellitus? no 5. Joint replacements in the past 12 months?no 6. Major health problems in the past 3 months?no 7. Any artificial heart valves, MVP, or defibrillator?no    MEDICATIONS & ALLERGIES:    Patient reports the following regarding taking any anticoagulation/antiplatelet therapy:   Plavix, Coumadin, Eliquis, Xarelto, Lovenox, Pradaxa, Brilinta, or Effient? no Aspirin? yes (81mg )  Patient confirms/reports the following medications:  Current Outpatient Prescriptions  Medication Sig Dispense Refill  . ALPRAZolam (XANAX) 0.25 MG tablet Take 1 tablet (0.25 mg total) by mouth 2 (two) times daily as needed for anxiety. 60 tablet 0  . amLODipine (NORVASC) 5 MG tablet Take 1 tablet (5 mg total) by mouth daily. 90 tablet 2  . atorvastatin (LIPITOR) 20 MG tablet TAKE 1 TABLET EVERY DAY USUALLY IN THE EVENING 90 tablet 2  . cyclobenzaprine (FLEXERIL) 10 MG tablet Take 1 tablet (10 mg total) by mouth 3 (three) times daily as needed for muscle spasms. 15 tablet 0  . diclofenac (VOLTAREN) 75 MG EC tablet Take 1 tablet (75 mg total) by mouth 2 (two) times daily. 30 tablet 0  . fexofenadine-pseudoephedrine (ALLEGRA-D ALLERGY & CONGESTION) 180-240 MG 24 hr tablet Take 1 tablet by mouth daily. 30 tablet 5  . fluticasone (FLONASE) 50 MCG/ACT nasal spray USE 2 SPRAYS IN EACH NOSTRIL EVERY DAY 48 g 5  . methocarbamol (ROBAXIN) 500 MG tablet Take 1 tablet (500 mg total) by mouth every 8 (eight) hours as needed for muscle spasms. 30 tablet 0  . ondansetron (ZOFRAN-ODT) 4 MG disintegrating tablet Take 1 tablet (4 mg total) by mouth every 8 (eight) hours  as needed for nausea or vomiting. 20 tablet 0  . oxyCODONE-acetaminophen (ROXICET) 5-325 MG tablet Take 1 tablet by mouth every 6 (six) hours as needed for severe pain. 20 tablet 0  . predniSONE (DELTASONE) 10 MG tablet Take 1 tablet (10 mg total) by mouth daily. 42 tablet 0  . rOPINIRole (REQUIP) 1 MG tablet Take 2 tablets (2 mg total) by mouth at bedtime. 180 tablet 3  . traMADol (ULTRAM) 50 MG tablet TAKE ONE TABLET BY MOUTH EVERY 12 HOURS AS NEEDED 30 tablet 0   No current facility-administered medications for this visit.     Patient confirms/reports the following allergies:  Allergies  Allergen Reactions  . Codeine Nausea Only  . Penicillins Swelling and Rash    No orders of the defined types were placed in this encounter.   AUTHORIZATION INFORMATION Primary Insurance: 1D#: Group #:  Secondary Insurance: 1D#: Group #:  SCHEDULE INFORMATION: Date: 04/02/17 Time: Location: [place]

## 2017-01-31 DIAGNOSIS — I6523 Occlusion and stenosis of bilateral carotid arteries: Secondary | ICD-10-CM | POA: Diagnosis not present

## 2017-02-13 DIAGNOSIS — J309 Allergic rhinitis, unspecified: Secondary | ICD-10-CM | POA: Diagnosis not present

## 2017-02-13 DIAGNOSIS — J209 Acute bronchitis, unspecified: Secondary | ICD-10-CM | POA: Diagnosis not present

## 2017-02-27 ENCOUNTER — Other Ambulatory Visit: Payer: Self-pay | Admitting: Pharmacy Technician

## 2017-02-27 NOTE — Patient Outreach (Signed)
[facility]) Care Management  02/27/2017  Tonita Bills [facility] [birth year] [phone]   Called patient to discuss Atorvastatin medication adherence. Left patient voicemail  Maud Deed. Page, Central Management [phone]

## 2017-03-06 ENCOUNTER — Telehealth: Payer: Self-pay | Admitting: Gastroenterology

## 2017-03-06 NOTE — Telephone Encounter (Signed)
Patient LVM and needs to r/s her procedure on 04/02/17 please call her at [phone].

## 2017-03-06 NOTE — Telephone Encounter (Signed)
LVM for pt to return my call.

## 2017-03-07 ENCOUNTER — Other Ambulatory Visit: Payer: Self-pay

## 2017-03-07 ENCOUNTER — Telehealth: Payer: Self-pay

## 2017-03-07 DIAGNOSIS — Z1211 Encounter for screening for malignant neoplasm of colon: Secondary | ICD-10-CM

## 2017-03-07 DIAGNOSIS — K219 Gastro-esophageal reflux disease without esophagitis: Secondary | ICD-10-CM

## 2017-03-07 NOTE — Telephone Encounter (Signed)
LVM for patient callback to reschedule procedure.

## 2017-04-06 ENCOUNTER — Ambulatory Visit: Admit: 2017-04-06 | Payer: Medicare PPO | Admitting: Gastroenterology

## 2017-04-06 SURGERY — COLONOSCOPY WITH PROPOFOL
Anesthesia: General

## 2017-04-25 ENCOUNTER — Telehealth: Payer: Self-pay | Admitting: Gastroenterology

## 2017-04-25 NOTE — Telephone Encounter (Signed)
Pt has rescheduled colonoscopy to 06/18/17. Please update prior authorization if needed.   Contacted [place] and changed.

## 2017-04-25 NOTE — Telephone Encounter (Signed)
Patient needs to reschedule her colonoscopy due to work and would like to reschedule to another Monday. I did NOT call to cancel

## 2017-06-11 ENCOUNTER — Encounter: Payer: Self-pay | Admitting: *Deleted

## 2017-06-15 NOTE — Discharge Instructions (Signed)
General Anesthesia, Adult, Care After °These instructions provide you with information about caring for yourself after your procedure. Your health care provider may also give you more specific instructions. Your treatment has been planned according to current medical practices, but problems sometimes occur. Call your health care provider if you have any problems or questions after your procedure. °What can I expect after the procedure? °After the procedure, it is common to have: °· Vomiting. °· A sore throat. °· Mental slowness. ° °It is common to feel: °· Nauseous. °· Cold or shivery. °· Sleepy. °· Tired. °· Sore or achy, even in parts of your body where you did not have surgery. ° °Follow these instructions at home: °For at least 24 hours after the procedure: °· Do not: °? Participate in activities where you could fall or become injured. °? Drive. °? Use heavy machinery. °? Drink alcohol. °? Take sleeping pills or medicines that cause drowsiness. °? Make important decisions or sign legal documents. °? Take care of children on your own. °· Rest. °Eating and drinking °· If you vomit, drink water, juice, or soup when you can drink without vomiting. °· Drink enough fluid to keep your urine clear or pale yellow. °· Make sure you have little or no nausea before eating solid foods. °· Follow the diet recommended by your health care provider. °General instructions °· Have a responsible adult stay with you until you are awake and alert. °· Return to your normal activities as told by your health care provider. Ask your health care provider what activities are safe for you. °· Take over-the-counter and prescription medicines only as told by your health care provider. °· If you smoke, do not smoke without supervision. °· Keep all follow-up visits as told by your health care provider. This is important. °Contact a health care provider if: °· You continue to have nausea or vomiting at home, and medicines are not helpful. °· You  cannot drink fluids or start eating again. °· You cannot urinate after 8-12 hours. °· You develop a skin rash. °· You have fever. °· You have increasing redness at the site of your procedure. °Get help right away if: °· You have difficulty breathing. °· You have chest pain. °· You have unexpected bleeding. °· You feel that you are having a life-threatening or urgent problem. °This information is not intended to replace advice given to you by your health care provider. Make sure you discuss any questions you have with your health care provider. °Document Released: 01/22/2001 Document Revised: 03/20/2016 Document Reviewed: 09/30/2015 °Elsevier Interactive Patient Education © 2018 [employer] ° °

## 2017-06-18 ENCOUNTER — Ambulatory Visit: Payer: Medicare PPO | Admitting: Anesthesiology

## 2017-06-18 ENCOUNTER — Encounter
Admission: RE | Disposition: A | Discharge status: Home / Self Care | Payer: Self-pay | Source: Ambulatory Visit | Attending: Gastroenterology

## 2017-06-18 ENCOUNTER — Ambulatory Visit
Admission: RE | Admit: 2017-06-18 | Discharge: 2017-06-18 | Disposition: A | Discharge status: Home / Self Care | Payer: Medicare PPO | Source: Ambulatory Visit | Attending: Gastroenterology | Admitting: Gastroenterology

## 2017-06-18 DIAGNOSIS — K449 Diaphragmatic hernia without obstruction or gangrene: Secondary | ICD-10-CM | POA: Insufficient documentation

## 2017-06-18 DIAGNOSIS — K635 Polyp of colon: Secondary | ICD-10-CM

## 2017-06-18 DIAGNOSIS — J45909 Unspecified asthma, uncomplicated: Secondary | ICD-10-CM | POA: Diagnosis not present

## 2017-06-18 DIAGNOSIS — Z79899 Other long term (current) drug therapy: Secondary | ICD-10-CM | POA: Insufficient documentation

## 2017-06-18 DIAGNOSIS — D125 Benign neoplasm of sigmoid colon: Secondary | ICD-10-CM | POA: Diagnosis not present

## 2017-06-18 DIAGNOSIS — K228 Other specified diseases of esophagus: Secondary | ICD-10-CM | POA: Insufficient documentation

## 2017-06-18 DIAGNOSIS — I10 Essential (primary) hypertension: Secondary | ICD-10-CM | POA: Diagnosis not present

## 2017-06-18 DIAGNOSIS — E785 Hyperlipidemia, unspecified: Secondary | ICD-10-CM | POA: Diagnosis not present

## 2017-06-18 DIAGNOSIS — Z1211 Encounter for screening for malignant neoplasm of colon: Secondary | ICD-10-CM

## 2017-06-18 DIAGNOSIS — D120 Benign neoplasm of cecum: Secondary | ICD-10-CM | POA: Diagnosis not present

## 2017-06-18 DIAGNOSIS — D122 Benign neoplasm of ascending colon: Secondary | ICD-10-CM

## 2017-06-18 DIAGNOSIS — K219 Gastro-esophageal reflux disease without esophagitis: Secondary | ICD-10-CM

## 2017-06-18 DIAGNOSIS — K229 Disease of esophagus, unspecified: Secondary | ICD-10-CM | POA: Diagnosis not present

## 2017-06-18 HISTORY — PX: COLONOSCOPY WITH PROPOFOL: SHX5780

## 2017-06-18 HISTORY — PX: ESOPHAGOGASTRODUODENOSCOPY (EGD) WITH PROPOFOL: SHX5813

## 2017-06-18 HISTORY — PX: POLYPECTOMY: SHX5525

## 2017-06-18 SURGERY — COLONOSCOPY WITH PROPOFOL
Anesthesia: General

## 2017-06-18 MED ORDER — SODIUM CHLORIDE 0.9 % IV SOLN
INTRAVENOUS | Status: DC | PRN
  Administered 2017-06-18: 09:00:00 via INTRAVENOUS

## 2017-06-18 MED ORDER — PROPOFOL 10 MG/ML IV BOLUS
INTRAVENOUS | Status: DC | PRN
  Administered 2017-06-18: 10 mg via INTRAVENOUS
  Administered 2017-06-18: 20 mg via INTRAVENOUS
  Administered 2017-06-18: 30 mg via INTRAVENOUS
  Administered 2017-06-18: 100 mg via INTRAVENOUS
  Administered 2017-06-18 (×2): 20 mg via INTRAVENOUS
  Administered 2017-06-18 (×2): 30 mg via INTRAVENOUS
  Administered 2017-06-18: 20 mg via INTRAVENOUS
  Administered 2017-06-18: 30 mg via INTRAVENOUS

## 2017-06-18 MED ORDER — LIDOCAINE HCL (CARDIAC) 20 MG/ML IV SOLN
INTRAVENOUS | Status: DC | PRN
  Administered 2017-06-18: 50 mg via INTRAVENOUS

## 2017-06-18 MED ORDER — ONDANSETRON HCL 4 MG/2ML IJ SOLN: 4.0000 mg | Freq: Once | INTRAMUSCULAR | Status: DC | PRN

## 2017-06-18 MED ORDER — STERILE WATER FOR IRRIGATION IR SOLN
Status: DC | PRN
  Administered 2017-06-18: 09:00:00

## 2017-06-18 MED ORDER — GLYCOPYRROLATE 0.2 MG/ML IJ SOLN
INTRAMUSCULAR | Status: DC | PRN
  Administered 2017-06-18: 0.2 mg via INTRAVENOUS

## 2017-06-18 MED ORDER — LACTATED RINGERS IV SOLN
INTRAVENOUS | Status: DC
  Administered 2017-06-18: 07:00:00 via INTRAVENOUS

## 2017-06-18 SURGICAL SUPPLY — 35 items
BALLN DILATOR 10-12 8 (BALLOONS)
BALLN DILATOR 12-15 8 (BALLOONS)
BALLN DILATOR 15-18 8 (BALLOONS)
BALLN DILATOR CRE 0-12 8 (BALLOONS)
BALLN DILATOR ESOPH 8 10 CRE (MISCELLANEOUS) IMPLANT
BALLOON DILATOR 12-15 8 (BALLOONS) IMPLANT
BALLOON DILATOR 15-18 8 (BALLOONS) IMPLANT
BALLOON DILATOR CRE 0-12 8 (BALLOONS) IMPLANT
BLOCK BITE 60FR ADLT L/F GRN (MISCELLANEOUS) ×3 IMPLANT
CANISTER SUCT 1200ML W/VALVE (MISCELLANEOUS) ×3 IMPLANT
CLIP HMST 235XBRD CATH ROT (MISCELLANEOUS) IMPLANT
CLIP RESOLUTION 360 11X235 (MISCELLANEOUS)
FCP ESCP3.2XJMB 240X2.8X (MISCELLANEOUS) ×2
FORCEPS BIOP RAD 4 LRG CAP 4 (CUTTING FORCEPS) ×3 IMPLANT
FORCEPS BIOP RJ4 240 W/NDL (MISCELLANEOUS) ×1
FORCEPS ESCP3.2XJMB 240X2.8X (MISCELLANEOUS) ×2 IMPLANT
GOWN CVR UNV OPN BCK APRN NK (MISCELLANEOUS) ×4 IMPLANT
GOWN ISOL THUMB LOOP REG UNIV (MISCELLANEOUS) ×2
INJECTOR VARIJECT VIN23 (MISCELLANEOUS) IMPLANT
KIT DEFENDO VALVE AND CONN (KITS) IMPLANT
KIT ENDO PROCEDURE OLY (KITS) ×3 IMPLANT
MARKER SPOT ENDO TATTOO 5ML (MISCELLANEOUS) IMPLANT
PAD GROUND ADULT SPLIT (MISCELLANEOUS) ×3 IMPLANT
PROBE APC STR FIRE (PROBE) IMPLANT
RETRIEVER NET PLAT FOOD (MISCELLANEOUS) IMPLANT
RETRIEVER NET ROTH 2.5X230 LF (MISCELLANEOUS) IMPLANT
SNARE SHORT THROW 13M SML OVAL (MISCELLANEOUS) IMPLANT
SNARE SHORT THROW 30M LRG OVAL (MISCELLANEOUS) IMPLANT
SNARE SNG USE RND 15MM (INSTRUMENTS) IMPLANT
SPOT EX ENDOSCOPIC TATTOO (MISCELLANEOUS)
SYR INFLATION 60ML (SYRINGE) IMPLANT
TRAP ETRAP POLY (MISCELLANEOUS) ×3 IMPLANT
VARIJECT INJECTOR VIN23 (MISCELLANEOUS)
WATER STERILE IRR 250ML POUR (IV SOLUTION) ×3 IMPLANT
WIRE CRE 18-20MM 8CM F G (MISCELLANEOUS) IMPLANT

## 2017-06-18 NOTE — Anesthesia Postprocedure Evaluation (Signed)
Anesthesia Post Note  Patient: Garrie Elenes Loden  Procedure(s) Performed: Procedure(s) (LRB): COLONOSCOPY WITH PROPOFOL (N/A) ESOPHAGOGASTRODUODENOSCOPY (EGD) WITH PROPOFOL (N/A) POLYPECTOMY  Patient location during evaluation: PACU Anesthesia Type: General Level of consciousness: awake and alert, oriented and patient cooperative Pain management: pain level controlled Vital Signs Assessment: post-procedure vital signs reviewed and stable Respiratory status: spontaneous breathing, nonlabored ventilation and respiratory function stable Cardiovascular status: blood pressure returned to baseline and stable Postop Assessment: adequate PO intake Anesthetic complications: no    Darrin Nipper

## 2017-06-18 NOTE — Anesthesia Preprocedure Evaluation (Signed)
Anesthesia Evaluation  Patient identified by MRN, date of birth, ID band Patient awake    Reviewed: Allergy & Precautions, NPO status , Patient's Chart, lab work & pertinent test results  History of Anesthesia Complications Negative for: history of anesthetic complications  Airway Mallampati: II  TM Distance: >3 FB Neck ROM: Full    Dental no notable dental hx.    Pulmonary asthma ,  Snoring    Pulmonary exam normal breath sounds clear to auscultation       Cardiovascular Exercise Tolerance: Good hypertension, + Valvular Problems/Murmurs (murmur)  Rhythm:Regular Rate:Normal + Systolic murmurs    Neuro/Psych negative neurological ROS     GI/Hepatic negative GI ROS,   Endo/Other  negative endocrine ROS  Renal/GU negative Renal ROS     Musculoskeletal   Abdominal   Peds  Hematology negative hematology ROS (+)   Anesthesia Other Findings   Reproductive/Obstetrics                             Anesthesia Physical Anesthesia Plan  ASA: II  Anesthesia Plan: General   Post-op Pain Management:    Induction: Intravenous  PONV Risk Score and Plan: 2 and Ondansetron and Propofol infusion  Airway Management Planned: Natural Airway  Additional Equipment:   Intra-op Plan:   Post-operative Plan:   Informed Consent: I have reviewed the patients History and Physical, chart, labs and discussed the procedure including the risks, benefits and alternatives for the proposed anesthesia with the patient or authorized representative who has indicated his/her understanding and acceptance.     Plan Discussed with: CRNA  Anesthesia Plan Comments:         Anesthesia Quick Evaluation

## 2017-06-18 NOTE — Anesthesia Procedure Notes (Signed)
Procedure Name: MAC Date/Time: 06/18/2017 8:24 AM Performed by: Janna Arch Pre-anesthesia Checklist: Patient identified, Emergency Drugs available, Suction available and Patient being monitored Patient Re-evaluated:Patient Re-evaluated prior to induction Oxygen Delivery Method: Nasal cannula

## 2017-06-18 NOTE — Op Note (Signed)
[facility] Gastroenterology Patient Name: [name] Procedure Date: 06/18/2017 8:25 AM MRN: [id number] Account #: [id number] Date of Birth: 1946/06/24 Admit Type: Outpatient Age: 71 Room: [facility] OR ROOM 01 Gender: Female Note Status: Finalized Procedure:            Upper GI endoscopy Indications:          Heartburn Providers:            Lucilla Lame MD, MD Referring MD:         Barnie Del. Lacinda Axon MD, MD (Referring MD) Medicines:            Propofol per Anesthesia Complications:        No immediate complications. Procedure:            Pre-Anesthesia Assessment:                       - Prior to the procedure, a History and Physical was                        performed, and patient medications and allergies were                        reviewed. The patient's tolerance of previous                        anesthesia was also reviewed. The risks and benefits of                        the procedure and the sedation options and risks were                        discussed with the patient. All questions were                        answered, and informed consent was obtained. Prior                        Anticoagulants: The patient has taken no previous                        anticoagulant or antiplatelet agents. ASA Grade                        Assessment: II - A patient with mild systemic disease.                        After reviewing the risks and benefits, the patient was                        deemed in satisfactory condition to undergo the                        procedure.                       After obtaining informed consent, the endoscope was                        passed under direct vision. Throughout the procedure,  the patient's blood pressure, pulse, and oxygen                        saturations were monitored continuously. The Olympus                        [id number]-HQ190 Endoscope (S#. [id number]) was introduced                        through  the mouth, and advanced to the second part of                        duodenum. The upper GI endoscopy was accomplished                        without difficulty. The patient tolerated the procedure                        well. Findings:      A small hiatal hernia was present.      White nummular lesions were noted in the lower third of the esophagus.       Biopsies were taken with a cold forceps for histology.      The stomach was normal.      The examined duodenum was normal. Impression:           - Small hiatal hernia.                       - White nummular lesions in esophageal mucosa. Biopsied.                       - Normal stomach.                       - Normal examined duodenum. Recommendation:       - Discharge patient to home.                       - Resume previous diet.                       - Continue present medications.                       - Await pathology results. Procedure Code(s):    --- Professional ---                       [phone], Esophagogastroduodenoscopy, flexible, transoral;                        with biopsy, single or multiple Diagnosis Code(s):    --- Professional ---                       R12, Heartburn                       K22.8, Other specified diseases of esophagus CPT copyright 2016 American Medical Association. All rights reserved. The codes documented in this report are preliminary and upon coder review may  be revised to meet current compliance requirements. Lucilla Lame MD, MD 06/18/2017 8:36:09 AM This report has been signed electronically. Number of Addenda:  0 Note Initiated On: 06/18/2017 8:25 AM      [facility]

## 2017-06-18 NOTE — Op Note (Signed)
[facility]astroenterology Patient Name: [name] Procedure Date: 06/18/2017 8:26 AM MRN: [id number] Account #: [id number] Date of Birth: 07/19/1946 Admit Type: Outpatient Age: 71 Room: [facility] OR ROOM 01 Gender: Female Note Status: Finalized Procedure:            Colonoscopy Indications:          Screening for colorectal malignant neoplasm Providers:            Lucilla Lame MD, MD Referring MD:         Barnie Del. Lacinda Axon MD, MD (Referring MD) Medicines:            Propofol per Anesthesia Complications:        No immediate complications. Procedure:            Pre-Anesthesia Assessment:                       - Prior to the procedure, a History and Physical was                        performed, and patient medications and allergies were                        reviewed. The patient's tolerance of previous                        anesthesia was also reviewed. The risks and benefits of                        the procedure and the sedation options and risks were                        discussed with the patient. All questions were                        answered, and informed consent was obtained. Prior                        Anticoagulants: The patient has taken no previous                        anticoagulant or antiplatelet agents. ASA Grade                        Assessment: II - A patient with mild systemic disease.                        After reviewing the risks and benefits, the patient was                        deemed in satisfactory condition to undergo the                        procedure.                       After obtaining informed consent, the colonoscope was                        passed under direct vision. Throughout the procedure,  the patient's blood pressure, pulse, and oxygen                        saturations were monitored continuously. The Olympus                        CF-HQ190L Colonoscope (S#. [id number]) was introduced                       through the anus and advanced to the the cecum,                        identified by appendiceal orifice and ileocecal valve.                        The colonoscopy was performed without difficulty. The                        patient tolerated the procedure well. The quality of                        the bowel preparation was excellent. Findings:      The perianal and digital rectal examinations were normal.      A 3 mm polyp was found in the cecum. The polyp was sessile. The polyp       was removed with a cold biopsy forceps. Resection and retrieval were       complete.      Three sessile polyps were found in the ascending colon. The polyps were       3 to 4 mm in size. These polyps were removed with a cold biopsy forceps.       Resection and retrieval were complete.      A 8 mm polyp was found in the sigmoid colon. The polyp was pedunculated.       The polyp was removed with a hot snare. Resection and retrieval were       complete. Impression:           - One 3 mm polyp in the cecum, removed with a cold                        biopsy forceps. Resected and retrieved.                       - Three 3 to 4 mm polyps in the ascending colon,                        removed with a cold biopsy forceps. Resected and                        retrieved.                       - One 8 mm polyp in the sigmoid colon, removed with a                        hot snare. Resected and retrieved. Recommendation:       - Discharge patient to home.                       -  Resume previous diet.                       - Continue present medications.                       - Await pathology results.                       - Repeat colonoscopy in 5 years if polyp adenoma and 10                        years if hyperplastic Procedure Code(s):    --- Professional ---                       [phone], Colonoscopy, flexible; with removal of tumor(s),                        polyp(s), or other lesion(s) by  snare technique                       45380, 16, Colonoscopy, flexible; with biopsy, single                        or multiple Diagnosis Code(s):    --- Professional ---                       Z12.11, Encounter for screening for malignant neoplasm                        of colon                       D12.0, Benign neoplasm of cecum                       D12.5, Benign neoplasm of sigmoid colon                       D12.2, Benign neoplasm of ascending colon CPT copyright 2016 American Medical Association. All rights reserved. The codes documented in this report are preliminary and upon coder review may  be revised to meet current compliance requirements. Lucilla Lame MD, MD 06/18/2017 8:53:39 AM This report has been signed electronically. Number of Addenda: 0 Note Initiated On: 06/18/2017 8:26 AM Scope Withdrawal Time: 0 hours 9 minutes 57 seconds  Total Procedure Duration: 0 hours 13 minutes 41 seconds       [facility]

## 2017-06-18 NOTE — Transfer of Care (Signed)
Immediate Anesthesia Transfer of Care Note  Patient: [name]  Procedure(s) Performed: Procedure(s): COLONOSCOPY WITH PROPOFOL (N/A) ESOPHAGOGASTRODUODENOSCOPY (EGD) WITH PROPOFOL (N/A) POLYPECTOMY  Patient Location: PACU  Anesthesia Type: General  Level of Consciousness: awake, alert  and patient cooperative  Airway and Oxygen Therapy: Patient Spontanous Breathing and Patient connected to supplemental oxygen  Post-op Assessment: Post-op Vital signs reviewed, Patient's Cardiovascular Status Stable, Respiratory Function Stable, Patent Airway and No signs of Nausea or vomiting  Post-op Vital Signs: Reviewed and stable  Complications: No apparent anesthesia complications

## 2017-06-18 NOTE — H&P (Signed)
Lucilla Lame, MD [address] [place],  [address] [phone] Fax : [phone]  Primary Care Physician:  Coral Spikes, DO Primary Gastroenterologist:  Dr. Allen Norris  Pre-Procedure History & Physical: HPI:  ASLEY BASKERVILLE is a 71 y.o. female is here for an endoscopy and colonoscopy.   Past Medical History:  Diagnosis Date  . Asthma   . H/O exercise stress test    at 22 years age  . Heart murmur   . Hyperlipidemia   . Hypertension   . Jaundice    In High school  . Jaundice    hx     Past Surgical History:  Procedure Laterality Date  . TUBAL LIGATION  1980    Prior to Admission medications   Medication Sig Start Date End Date Taking? Authorizing Provider  ALPRAZolam (XANAX) 0.25 MG tablet Take 1 tablet (0.25 mg total) by mouth 2 (two) times daily as needed for anxiety. 07/08/14  Yes Jackolyn Confer, MD  amLODipine (NORVASC) 5 MG tablet Take 1 tablet (5 mg total) by mouth daily. 11/03/15  Yes Jackolyn Confer, MD  atorvastatin (LIPITOR) 20 MG tablet TAKE 1 TABLET EVERY DAY USUALLY IN THE EVENING 11/03/15  Yes Jackolyn Confer, MD  esomeprazole ([place]) 40 MG capsule  12/29/16  Yes [provider]  fexofenadine-pseudoephedrine (ALLEGRA-D ALLERGY & CONGESTION) 180-240 MG 24 hr tablet Take 1 tablet by mouth daily. 08/30/15  Yes Jackolyn Confer, MD  fluticasone (FLONASE) 50 MCG/ACT nasal spray USE 2 SPRAYS IN EACH NOSTRIL EVERY DAY 09/28/14  Yes Jackolyn Confer, MD  rOPINIRole (REQUIP) 1 MG tablet Take 2 tablets (2 mg total) by mouth at bedtime. 11/05/15  Yes Jackolyn Confer, MD    Allergies as of 03/07/2017 - Review Complete 11/06/2016  Allergen Reaction Noted  . Codeine Nausea Only 02/24/2013  . Penicillins Swelling and Rash 10/05/2011    Family History  Problem Relation Age of Onset  . Heart attack Mother   . Heart disease Mother 6  . Heart attack Father   . Heart disease Father 32  . Diabetes Sister   . Heart disease  Brother   . Heart disease Brother   . Diabetes Maternal Grandmother   . Breast cancer Other   . Colon cancer Neg Hx     Social History   Social History  . Marital status: Married    Spouse name: N/A  . Number of children: 3  . Years of education: N/A   Occupational History  . Self Employed - Hairdresser    Social History Main Topics  . Smoking status: Never Smoker  . Smokeless tobacco: Never Used  . Alcohol use 4.2 oz/week    7 Glasses of wine per week     Comment: Wine nightly  . Drug use: No  . Sexual activity: Not on file   Other Topics Concern  . Not on file   Social History Narrative   Lives in [place]. Works at [employer] in [employer]      Daily Caffeine Use:  2 coffee   Regular Exercise -  NO             Review of Systems: See HPI, otherwise negative ROS  Physical Exam: BP 113/61   Pulse 67   Temp 97.9 F (36.6 C) (Temporal)   Resp 16   Ht 4\' 11"  (1.499 m)   Wt 139 lb (63 kg)   SpO2 97%   BMI 28.07 kg/m  General:   Alert,  pleasant and cooperative in NAD Head:  Normocephalic and atraumatic. Neck:  Supple; no masses or thyromegaly. Lungs:  Clear throughout to auscultation.    Heart:  Regular rate and rhythm. Abdomen:  Soft, nontender and nondistended. Normal bowel sounds, without guarding, and without rebound.   Neurologic:  Alert and  oriented x4;  grossly normal neurologically.  Impression/Plan: SHETARA LAUNER is here for an endoscopy and colonoscopy to be performed for GERD and screening  Risks, benefits, limitations, and alternatives regarding  endoscopy and colonoscopy have been reviewed with the patient.  Questions have been answered.  All parties agreeable.   Lucilla Lame, MD  06/18/2017, 7:51 AM

## 2017-06-19 ENCOUNTER — Encounter: Payer: Self-pay | Admitting: Gastroenterology

## 2017-06-22 ENCOUNTER — Encounter: Payer: Self-pay | Admitting: Gastroenterology

## 2017-06-29 DIAGNOSIS — N390 Urinary tract infection, site not specified: Secondary | ICD-10-CM | POA: Diagnosis not present

## 2017-06-29 DIAGNOSIS — R41840 Attention and concentration deficit: Secondary | ICD-10-CM | POA: Diagnosis not present

## 2017-06-29 DIAGNOSIS — M5441 Lumbago with sciatica, right side: Secondary | ICD-10-CM | POA: Diagnosis not present

## 2017-06-29 DIAGNOSIS — K210 Gastro-esophageal reflux disease with esophagitis: Secondary | ICD-10-CM | POA: Diagnosis not present

## 2017-07-06 ENCOUNTER — Telehealth: Payer: Self-pay | Admitting: Gastroenterology

## 2017-07-06 NOTE — Telephone Encounter (Signed)
results

## 2017-07-06 NOTE — Telephone Encounter (Signed)
Pt is calling requesting her colonoscopy results. Please advise.

## 2017-07-09 NOTE — Telephone Encounter (Signed)
Please have the patient come in to discuss the results.

## 2017-07-11 DIAGNOSIS — J069 Acute upper respiratory infection, unspecified: Secondary | ICD-10-CM | POA: Diagnosis not present

## 2017-07-11 NOTE — Telephone Encounter (Signed)
Pt scheduled for an office visit on Monday, Sept 17th.

## 2017-07-16 ENCOUNTER — Encounter: Payer: Self-pay | Admitting: Gastroenterology

## 2017-07-16 ENCOUNTER — Other Ambulatory Visit: Payer: Self-pay

## 2017-07-16 ENCOUNTER — Ambulatory Visit (INDEPENDENT_AMBULATORY_CARE_PROVIDER_SITE_OTHER): Payer: Medicare PPO | Admitting: Gastroenterology

## 2017-07-16 VITALS — BP 144/69 | HR 75 | Temp 98.7°F | Ht 60.0 in | Wt 140.0 lb

## 2017-07-16 DIAGNOSIS — K228 Other specified diseases of esophagus: Secondary | ICD-10-CM | POA: Diagnosis not present

## 2017-07-16 DIAGNOSIS — Z86010 Personal history of colonic polyps: Secondary | ICD-10-CM | POA: Diagnosis not present

## 2017-07-16 DIAGNOSIS — K2281 Esophageal polyp: Secondary | ICD-10-CM

## 2017-07-16 DIAGNOSIS — K2289 Other specified disease of esophagus: Secondary | ICD-10-CM

## 2017-07-16 NOTE — Progress Notes (Signed)
    Primary Care Physician: Coral Spikes, DO  Primary Gastroenterologist:  Dr. Lucilla Lame  Chief Complaint  Patient presents with  . Follow up Colonoscopy results    HPI: [name] is a 71 y.o. female here For follow-up after having an EGD and colonoscopy. The patient was found to have multiple adenomas and a tubulovillous adenoma. 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  Current Outpatient Prescriptions  Medication Sig Dispense Refill  . ALPRAZolam (XANAX) 0.25 MG tablet Take 1 tablet (0.25 mg total) by mouth 2 (two) times daily as needed for anxiety. 60 tablet 0  . amLODipine (NORVASC) 5 MG tablet Take 1 tablet (5 mg total) by mouth daily. 90 tablet 2  . atorvastatin (LIPITOR) 20 MG tablet TAKE 1 TABLET EVERY DAY USUALLY IN THE EVENING 90 tablet 2  . cefdinir (OMNICEF) 300 MG capsule TAKE 1 CAPSULE EVERY 12 HOURS  0  . esomeprazole (NEXIUM) 40 MG capsule     . fexofenadine-pseudoephedrine (ALLEGRA-D ALLERGY & CONGESTION) 180-240 MG 24 hr tablet Take 1 tablet by mouth daily. 30 tablet 5  . fluticasone (FLONASE) 50 MCG/ACT nasal spray USE 2 SPRAYS IN EACH NOSTRIL EVERY DAY 48 g 5  . methylPREDNISolone (MEDROL DOSEPAK) 4 MG TBPK tablet See admin instructions.  0  . rOPINIRole (REQUIP) 1 MG tablet Take 2 tablets (2 mg total) by mouth at bedtime. 180 tablet 3   No current facility-administered medications for this visit.     Allergies as of 07/16/2017 - Review Complete 07/16/2017  Allergen Reaction Noted  . Codeine Nausea Only 02/24/2013  . Penicillins Swelling and Rash 10/05/2011    ROS:  General: Negative for anorexia, weight loss, fever, chills, fatigue, weakness. ENT: Negative for hoarseness, difficulty swallowing , nasal congestion. CV: Negative for chest pain, angina, palpitations, dyspnea on exertion, peripheral edema.    Respiratory: Negative for dyspnea at rest, dyspnea on exertion, cough, sputum, wheezing.  GI: See history of present illness. GU:  Negative for dysuria, hematuria, urinary incontinence, urinary frequency, nocturnal urination.  Endo: Negative for unusual weight change.    Physical Examination:   BP (!) 144/69   Pulse 75   Temp 98.7 F (37.1 C) (Oral)   Ht 5' (1.524 m)   Wt 140 lb (63.5 kg)   BMI 27.34 kg/m   General: Well-nourished, well-developed in no acute distress.  Eyes: No icterus. Conjunctivae pink. Neuro: Alert and oriented x 3.  Grossly intact. Skin: Warm and dry, no jaundice.   Psych: Alert and cooperative, normal mood and affect.  Labs:    Imaging Studies: No results found.  Assessment and Plan:   [name] is a 71 y.o. y/o female who had a colonoscopy with multiple adenomas and a tubulovillous adenoma. The patient will be set up for repeat colonoscopy in 3 years. 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 The patient will have a repeat EGD in 3 months to assess this area for any squamous cell malignancy associated with it. The patient has been explained the plan and agrees with it.    Lucilla Lame, MD. Marval Regal   Note: This dictation was prepared with Dragon dictation along with smaller phrase technology. Any transcriptional errors that result from this process are unintentional.

## 2017-07-27 DIAGNOSIS — I10 Essential (primary) hypertension: Secondary | ICD-10-CM | POA: Diagnosis not present

## 2017-07-27 DIAGNOSIS — M5441 Lumbago with sciatica, right side: Secondary | ICD-10-CM | POA: Diagnosis not present

## 2017-07-27 DIAGNOSIS — F909 Attention-deficit hyperactivity disorder, unspecified type: Secondary | ICD-10-CM | POA: Diagnosis not present

## 2017-08-04 DIAGNOSIS — E785 Hyperlipidemia, unspecified: Secondary | ICD-10-CM | POA: Diagnosis not present

## 2017-08-04 DIAGNOSIS — M545 Low back pain: Secondary | ICD-10-CM | POA: Diagnosis not present

## 2017-08-04 DIAGNOSIS — I10 Essential (primary) hypertension: Secondary | ICD-10-CM | POA: Diagnosis not present

## 2017-08-04 DIAGNOSIS — K219 Gastro-esophageal reflux disease without esophagitis: Secondary | ICD-10-CM | POA: Diagnosis not present

## 2017-08-04 DIAGNOSIS — G2581 Restless legs syndrome: Secondary | ICD-10-CM | POA: Diagnosis not present

## 2017-08-04 DIAGNOSIS — H547 Unspecified visual loss: Secondary | ICD-10-CM | POA: Diagnosis not present

## 2017-08-06 ENCOUNTER — Other Ambulatory Visit: Payer: Self-pay

## 2017-08-06 DIAGNOSIS — K2289 Other specified disease of esophagus: Secondary | ICD-10-CM

## 2017-08-06 DIAGNOSIS — K228 Other specified diseases of esophagus: Secondary | ICD-10-CM

## 2017-08-10 DIAGNOSIS — Z85828 Personal history of other malignant neoplasm of skin: Secondary | ICD-10-CM | POA: Diagnosis not present

## 2017-08-10 DIAGNOSIS — L814 Other melanin hyperpigmentation: Secondary | ICD-10-CM | POA: Diagnosis not present

## 2017-08-10 DIAGNOSIS — D229 Melanocytic nevi, unspecified: Secondary | ICD-10-CM | POA: Diagnosis not present

## 2017-08-10 DIAGNOSIS — L720 Epidermal cyst: Secondary | ICD-10-CM | POA: Diagnosis not present

## 2017-08-10 DIAGNOSIS — L280 Lichen simplex chronicus: Secondary | ICD-10-CM | POA: Diagnosis not present

## 2017-08-21 ENCOUNTER — Other Ambulatory Visit: Payer: Self-pay

## 2017-09-03 ENCOUNTER — Other Ambulatory Visit: Payer: Self-pay | Admitting: Nurse Practitioner

## 2017-09-03 ENCOUNTER — Ambulatory Visit
Admission: RE | Admit: 2017-09-03 | Discharge: 2017-09-03 | Disposition: A | Discharge status: Home / Self Care | Payer: Medicare PPO | Source: Ambulatory Visit | Attending: Nurse Practitioner | Admitting: Nurse Practitioner

## 2017-09-03 DIAGNOSIS — M25512 Pain in left shoulder: Secondary | ICD-10-CM | POA: Diagnosis present

## 2017-09-03 DIAGNOSIS — M19012 Primary osteoarthritis, left shoulder: Secondary | ICD-10-CM | POA: Diagnosis not present

## 2017-09-03 DIAGNOSIS — R52 Pain, unspecified: Secondary | ICD-10-CM

## 2017-09-26 ENCOUNTER — Encounter: Payer: Self-pay | Admitting: *Deleted

## 2017-10-04 NOTE — Discharge Instructions (Signed)
General Anesthesia, Adult, Care After °These instructions provide you with information about caring for yourself after your procedure. Your health care provider may also give you more specific instructions. Your treatment has been planned according to current medical practices, but problems sometimes occur. Call your health care provider if you have any problems or questions after your procedure. °What can I expect after the procedure? °After the procedure, it is common to have: °· Vomiting. °· A sore throat. °· Mental slowness. ° °It is common to feel: °· Nauseous. °· Cold or shivery. °· Sleepy. °· Tired. °· Sore or achy, even in parts of your body where you did not have surgery. ° °Follow these instructions at home: °For at least 24 hours after the procedure: °· Do not: °? Participate in activities where you could fall or become injured. °? Drive. °? Use heavy machinery. °? Drink alcohol. °? Take sleeping pills or medicines that cause drowsiness. °? Make important decisions or sign legal documents. °? Take care of children on your own. °· Rest. °Eating and drinking °· If you vomit, drink water, juice, or soup when you can drink without vomiting. °· Drink enough fluid to keep your urine clear or pale yellow. °· Make sure you have little or no nausea before eating solid foods. °· Follow the diet recommended by your health care provider. °General instructions °· Have a responsible adult stay with you until you are awake and alert. °· Return to your normal activities as told by your health care provider. Ask your health care provider what activities are safe for you. °· Take over-the-counter and prescription medicines only as told by your health care provider. °· If you smoke, do not smoke without supervision. °· Keep all follow-up visits as told by your health care provider. This is important. °Contact a health care provider if: °· You continue to have nausea or vomiting at home, and medicines are not helpful. °· You  cannot drink fluids or start eating again. °· You cannot urinate after 8-12 hours. °· You develop a skin rash. °· You have fever. °· You have increasing redness at the site of your procedure. °Get help right away if: °· You have difficulty breathing. °· You have chest pain. °· You have unexpected bleeding. °· You feel that you are having a life-threatening or urgent problem. °This information is not intended to replace advice given to you by your health care provider. Make sure you discuss any questions you have with your health care provider. °Document Released: 01/22/2001 Document Revised: 03/20/2016 Document Reviewed: 09/30/2015 °Elsevier Interactive Patient Education © 2018 [employer] ° °

## 2017-10-05 ENCOUNTER — Ambulatory Visit: Payer: Medicare PPO | Admitting: Anesthesiology

## 2017-10-05 ENCOUNTER — Encounter
Admission: RE | Disposition: A | Discharge status: Home / Self Care | Payer: Self-pay | Source: Ambulatory Visit | Attending: Gastroenterology

## 2017-10-05 ENCOUNTER — Ambulatory Visit
Admission: RE | Admit: 2017-10-05 | Discharge: 2017-10-05 | Disposition: A | Discharge status: Home / Self Care | Payer: Medicare PPO | Source: Ambulatory Visit | Attending: Gastroenterology | Admitting: Gastroenterology

## 2017-10-05 DIAGNOSIS — Z79899 Other long term (current) drug therapy: Secondary | ICD-10-CM | POA: Insufficient documentation

## 2017-10-05 DIAGNOSIS — R12 Heartburn: Secondary | ICD-10-CM

## 2017-10-05 DIAGNOSIS — K259 Gastric ulcer, unspecified as acute or chronic, without hemorrhage or perforation: Secondary | ICD-10-CM | POA: Diagnosis not present

## 2017-10-05 DIAGNOSIS — I10 Essential (primary) hypertension: Secondary | ICD-10-CM | POA: Diagnosis not present

## 2017-10-05 DIAGNOSIS — K2100 Gastro-esophageal reflux disease with esophagitis, without bleeding: Secondary | ICD-10-CM

## 2017-10-05 DIAGNOSIS — Z7951 Long term (current) use of inhaled steroids: Secondary | ICD-10-CM | POA: Diagnosis not present

## 2017-10-05 DIAGNOSIS — K228 Other specified diseases of esophagus: Secondary | ICD-10-CM | POA: Diagnosis not present

## 2017-10-05 DIAGNOSIS — E785 Hyperlipidemia, unspecified: Secondary | ICD-10-CM | POA: Diagnosis not present

## 2017-10-05 DIAGNOSIS — K2289 Other specified disease of esophagus: Secondary | ICD-10-CM

## 2017-10-05 HISTORY — PX: ESOPHAGOGASTRODUODENOSCOPY (EGD) WITH PROPOFOL: SHX5813

## 2017-10-05 SURGERY — ESOPHAGOGASTRODUODENOSCOPY (EGD) WITH PROPOFOL
Anesthesia: General | Wound class: Clean Contaminated

## 2017-10-05 MED ORDER — GLYCOPYRROLATE 0.2 MG/ML IJ SOLN
INTRAMUSCULAR | Status: DC | PRN
  Administered 2017-10-05: 0.2 mg via INTRAVENOUS

## 2017-10-05 MED ORDER — LIDOCAINE HCL (CARDIAC) 20 MG/ML IV SOLN
INTRAVENOUS | Status: DC | PRN
  Administered 2017-10-05: 40 mg via INTRAVENOUS

## 2017-10-05 MED ORDER — ACETAMINOPHEN 325 MG PO TABS: 325.0000 mg | ORAL_TABLET | Freq: Once | ORAL | Status: DC

## 2017-10-05 MED ORDER — SODIUM CHLORIDE 0.9 % IV SOLN: INTRAVENOUS | Status: DC

## 2017-10-05 MED ORDER — LACTATED RINGERS IV SOLN
INTRAVENOUS | Status: DC
  Administered 2017-10-05: 07:00:00 via INTRAVENOUS

## 2017-10-05 MED ORDER — PROPOFOL 10 MG/ML IV BOLUS
INTRAVENOUS | Status: DC | PRN
  Administered 2017-10-05 (×2): 20 mg via INTRAVENOUS
  Administered 2017-10-05: 70 mg via INTRAVENOUS
  Administered 2017-10-05 (×2): 20 mg via INTRAVENOUS

## 2017-10-05 MED ORDER — ACETAMINOPHEN 160 MG/5ML PO SOLN: 325.0000 mg | Freq: Once | ORAL | Status: DC

## 2017-10-05 SURGICAL SUPPLY — 32 items
BALLN DILATOR 10-12 8 (BALLOONS)
BALLN DILATOR 12-15 8 (BALLOONS)
BALLN DILATOR 15-18 8 (BALLOONS)
BALLN DILATOR CRE 0-12 8 (BALLOONS)
BALLN DILATOR ESOPH 8 10 CRE (MISCELLANEOUS) IMPLANT
BALLOON DILATOR 12-15 8 (BALLOONS) IMPLANT
BALLOON DILATOR 15-18 8 (BALLOONS) IMPLANT
BALLOON DILATOR CRE 0-12 8 (BALLOONS) IMPLANT
BLOCK BITE 60FR ADLT L/F GRN (MISCELLANEOUS) ×2 IMPLANT
CANISTER SUCT 1200ML W/VALVE (MISCELLANEOUS) ×2 IMPLANT
CLIP HMST 235XBRD CATH ROT (MISCELLANEOUS) IMPLANT
CLIP RESOLUTION 360 11X235 (MISCELLANEOUS)
FCP ESCP3.2XJMB 240X2.8X (MISCELLANEOUS) ×1
FORCEPS BIOP RAD 4 LRG CAP 4 (CUTTING FORCEPS) IMPLANT
FORCEPS BIOP RJ4 240 W/NDL (MISCELLANEOUS) ×1
FORCEPS ESCP3.2XJMB 240X2.8X (MISCELLANEOUS) ×1 IMPLANT
GOWN CVR UNV OPN BCK APRN NK (MISCELLANEOUS) ×2 IMPLANT
GOWN ISOL THUMB LOOP REG UNIV (MISCELLANEOUS) ×2
INJECTOR VARIJECT VIN23 (MISCELLANEOUS) IMPLANT
KIT DEFENDO VALVE AND CONN (KITS) IMPLANT
KIT ENDO PROCEDURE OLY (KITS) ×2 IMPLANT
MARKER SPOT ENDO TATTOO 5ML (MISCELLANEOUS) IMPLANT
PAD GROUND ADULT SPLIT (MISCELLANEOUS) IMPLANT
RETRIEVER NET PLAT FOOD (MISCELLANEOUS) IMPLANT
SNARE SHORT THROW 13M SML OVAL (MISCELLANEOUS) IMPLANT
SNARE SHORT THROW 30M LRG OVAL (MISCELLANEOUS) IMPLANT
SPOT EX ENDOSCOPIC TATTOO (MISCELLANEOUS)
SYR INFLATION 60ML (SYRINGE) IMPLANT
TRAP ETRAP POLY (MISCELLANEOUS) IMPLANT
VARIJECT INJECTOR VIN23 (MISCELLANEOUS)
WATER STERILE IRR 250ML POUR (IV SOLUTION) ×2 IMPLANT
WIRE CRE 18-20MM 8CM F G (MISCELLANEOUS) IMPLANT

## 2017-10-05 NOTE — Anesthesia Procedure Notes (Signed)
Performed by: Starnisha Batrez, CRNA Pre-anesthesia Checklist: Patient identified, Emergency Drugs available, Suction available, Timeout performed and Patient being monitored Patient Re-evaluated:Patient Re-evaluated prior to induction Oxygen Delivery Method: Nasal cannula Placement Confirmation: positive ETCO2       

## 2017-10-05 NOTE — Transfer of Care (Signed)
Immediate Anesthesia Transfer of Care Note  Patient: [name]  Procedure(s) Performed: ESOPHAGOGASTRODUODENOSCOPY (EGD) WITH PROPOFOL (N/A )  Patient Location: PACU  Anesthesia Type: General  Level of Consciousness: awake, alert  and patient cooperative  Airway and Oxygen Therapy: Patient Spontanous Breathing and Patient connected to supplemental oxygen  Post-op Assessment: Post-op Vital signs reviewed, Patient's Cardiovascular Status Stable, Respiratory Function Stable, Patent Airway and No signs of Nausea or vomiting  Post-op Vital Signs: Reviewed and stable  Complications: No apparent anesthesia complications

## 2017-10-05 NOTE — Anesthesia Postprocedure Evaluation (Signed)
Anesthesia Post Note  Patient: [name]  Procedure(s) Performed: ESOPHAGOGASTRODUODENOSCOPY (EGD) WITH PROPOFOL (N/A )  Patient location during evaluation: PACU Anesthesia Type: General Level of consciousness: awake and alert and oriented Pain management: satisfactory to patient Vital Signs Assessment: post-procedure vital signs reviewed and stable Respiratory status: spontaneous breathing, nonlabored ventilation and respiratory function stable Cardiovascular status: blood pressure returned to baseline and stable Postop Assessment: Adequate PO intake and No signs of nausea or vomiting Anesthetic complications: no    Raliegh Ip

## 2017-10-05 NOTE — Anesthesia Preprocedure Evaluation (Signed)
Anesthesia Evaluation  Patient identified by MRN, date of birth, ID band Patient awake    Reviewed: Allergy & Precautions, H&P , NPO status , Patient's Chart, lab work & pertinent test results  History of Anesthesia Complications Negative for: history of anesthetic complications  Airway Mallampati: II  TM Distance: >3 FB Neck ROM: full    Dental no notable dental hx.    Pulmonary asthma ,  Snoring    Pulmonary exam normal breath sounds clear to auscultation       Cardiovascular Exercise Tolerance: Good hypertension, Normal cardiovascular exam+ Valvular Problems/Murmurs (murmur)  Rhythm:regular Rate:Normal - Systolic murmurs    Neuro/Psych negative neurological ROS     GI/Hepatic negative GI ROS, GERD  ,  Endo/Other  negative endocrine ROS  Renal/GU negative Renal ROS     Musculoskeletal   Abdominal   Peds  Hematology negative hematology ROS (+)   Anesthesia Other Findings   Reproductive/Obstetrics                             Anesthesia Physical  Anesthesia Plan  ASA: II  Anesthesia Plan: General   Post-op Pain Management:    Induction: Intravenous  PONV Risk Score and Plan: 3 and Ondansetron and Propofol infusion  Airway Management Planned: Natural Airway  Additional Equipment:   Intra-op Plan:   Post-operative Plan:   Informed Consent: I have reviewed the patients History and Physical, chart, labs and discussed the procedure including the risks, benefits and alternatives for the proposed anesthesia with the patient or authorized representative who has indicated his/her understanding and acceptance.     Plan Discussed with: CRNA  Anesthesia Plan Comments:         Anesthesia Quick Evaluation

## 2017-10-05 NOTE — Op Note (Addendum)
[facility] Gastroenterology Patient Name: [name] Procedure Date: 10/05/2017 8:30 AM MRN: [id number] Account #: [id number] Date of Birth: 1946-06-08 Admit Type: Outpatient Age: 71 Room: [facility] OR ROOM 01 Gender: Female Note Status: Supervisor Override Procedure:            Upper GI endoscopy Indications:          Heartburn eslophageal leukoplakia on last EGD Providers:            Lucilla Lame MD, MD Referring MD:         Lavera Guise, MD (Referring MD) Medicines:            Propofol per Anesthesia Complications:        No immediate complications. Procedure:            Pre-Anesthesia Assessment:                       - Prior to the procedure, a History and Physical was                        performed, and patient medications and allergies were                        reviewed. The patient's tolerance of previous                        anesthesia was also reviewed. The risks and benefits of                        the procedure and the sedation options and risks were                        discussed with the patient. All questions were                        answered, and informed consent was obtained. Prior                        Anticoagulants: The patient has taken no previous                        anticoagulant or antiplatelet agents. ASA Grade                        Assessment: II - A patient with mild systemic disease.                        After reviewing the risks and benefits, the patient was                        deemed in satisfactory condition to undergo the                        procedure.                       After obtaining informed consent, the endoscope was                        passed under direct vision. Throughout the procedure,  the patient's blood pressure, pulse, and oxygen                        saturations were monitored continuously. The Olympus                        [id number]-HQ190 Endoscope (S#. [id number]) was  introduced                        through the mouth, and advanced to the second part of                        duodenum. The upper GI endoscopy was accomplished                        without difficulty. The patient tolerated the procedure                        well. Findings:      LA Grade C (one or more mucosal breaks continuous between tops of 2 or       more mucosal folds, less than 75% circumference) esophagitis with no       bleeding was found in the lower third of the esophagus.      White nummular lesions were noted in the lower third of the esophagus.       Biopsies were taken with a cold forceps for histology.      One non-bleeding cratered gastric ulcer with no stigmata of bleeding was       found in the gastric antrum. The lesion was 10 mm in largest dimension.      The examined duodenum was normal. Impression:           - LA Grade C reflux esophagitis.                       - White nummular lesions in esophageal mucosa. Biopsied.                       - Non-bleeding gastric ulcer with no stigmata of                        bleeding.                       - Normal examined duodenum. Recommendation:       - Await pathology results.                       - Discharge patient to home.                       - Resume previous diet.                       - Continue present medications.                       - Await pathology results. Procedure Code(s):    --- Professional ---                       [phone], Esophagogastroduodenoscopy, flexible, transoral;  with biopsy, single or multiple Diagnosis Code(s):    --- Professional ---                       R12, Heartburn                       K22.8, Other specified diseases of esophagus                       K25.9, Gastric ulcer, unspecified as acute or chronic,                        without hemorrhage or perforation                       K21.0, Gastro-esophageal reflux disease with esophagitis CPT copyright 2018  American Medical Association. All rights reserved. The codes documented in this report are preliminary and upon coder review may  be revised to meet current compliance requirements. Lucilla Lame MD, MD 10/05/2017 8:57:30 AM This report has been signed electronically. Number of Addenda: 0 Note Initiated On: 10/05/2017 8:30 AM      [facility]

## 2017-10-05 NOTE — H&P (Signed)
Lucilla Lame, MD [address] [place], [place] [address] Phone:[phone] Fax : [phone]  Primary Care Physician:  Lavera Guise, MD Primary Gastroenterologist:  Dr. Allen Norris  Pre-Procedure History & Physical: HPI:  [name] is a 71 y.o. female is here for an endoscopy.   Past Medical History:  Diagnosis Date  . Asthma   . H/O exercise stress test    at 65 years age  . Heart murmur   . Hyperlipidemia   . Hypertension   . Jaundice    In High school  . Jaundice    hx     Past Surgical History:  Procedure Laterality Date  . COLONOSCOPY WITH PROPOFOL N/A 06/18/2017   Procedure: COLONOSCOPY WITH PROPOFOL;  Surgeon: Lucilla Lame, MD;  Location: [facility];  Service: Endoscopy;  Laterality: N/A;  . ESOPHAGOGASTRODUODENOSCOPY (EGD) WITH PROPOFOL N/A 06/18/2017   Procedure: ESOPHAGOGASTRODUODENOSCOPY (EGD) WITH PROPOFOL;  Surgeon: Lucilla Lame, MD;  Location: [facility];  Service: Endoscopy;  Laterality: N/A;  . POLYPECTOMY  06/18/2017   Procedure: POLYPECTOMY;  Surgeon: Lucilla Lame, MD;  Location: [facility];  Service: Endoscopy;;  . [place]    Prior to Admission medications   Medication Sig Start Date End Date Taking? Authorizing Provider  amLODipine (NORVASC) 5 MG tablet Take 1 tablet (5 mg total) by mouth daily. 11/03/15  Yes Jackolyn Confer, MD  atorvastatin (LIPITOR) 20 MG tablet TAKE 1 TABLET EVERY DAY USUALLY IN THE EVENING 11/03/15  Yes Jackolyn Confer, MD  fexofenadine-pseudoephedrine (ALLEGRA-D ALLERGY & CONGESTION) 180-240 MG 24 hr tablet Take 1 tablet by mouth daily. 08/30/15  Yes Jackolyn Confer, MD  fluticasone (FLONASE) 50 MCG/ACT nasal spray USE 2 SPRAYS IN EACH NOSTRIL EVERY DAY 09/28/14  Yes Jackolyn Confer, MD  rOPINIRole (REQUIP) 1 MG tablet Take 2 tablets (2 mg total) by mouth at bedtime. 11/05/15  Yes Jackolyn Confer, MD  ALPRAZolam Duanne [place]) 0.25 MG tablet Take 1 tablet (0.25 mg total)  by mouth 2 (two) times daily as needed for anxiety. Patient not taking: Reported on 09/26/2017 07/08/14   Jackolyn Confer, MD  cefdinir (OMNICEF) 300 MG capsule TAKE 1 CAPSULE EVERY 12 HOURS 07/11/17   [provider]  esomeprazole (NEXIUM) 40 MG capsule  12/29/16   [provider]  methylPREDNISolone (MEDROL DOSEPAK) 4 MG TBPK tablet See admin instructions. 07/11/17   [provider]    Allergies as of 08/06/2017 - Review Complete 07/16/2017  Allergen Reaction Noted  . Codeine Nausea Only 02/24/2013  . Penicillins Swelling and Rash 10/05/2011    Family History  Problem Relation Age of Onset  . Heart attack Mother   . Heart disease Mother 52  . Heart attack Father   . Heart disease Father 60  . Diabetes Sister   . Heart disease Brother   . Heart disease Brother   . Diabetes Maternal Grandmother   . Breast cancer Other   . Colon cancer Neg Hx     Social History   Socioeconomic History  . Marital status: Married    Spouse name: Not on file  . Number of children: 3  . Years of education: Not on file  . Highest education level: Not on file  Social Needs  . Financial resource strain: Not on file  . Food insecurity - worry: Not on file  . Food insecurity - inability: Not on file  . Transportation needs - medical: Not on file  .  Transportation needs - non-medical: Not on file  Occupational History  . Occupation: Self Employed - Hairdresser  Tobacco Use  . Smoking status: Never Smoker  . Smokeless tobacco: Never Used  Substance and Sexual Activity  . Alcohol use: Yes    Alcohol/week: 4.2 oz    Types: 7 Glasses of wine per week    Comment: Wine nightly  . Drug use: No  . Sexual activity: Not on file  Other Topics Concern  . Not on file  Social History Narrative   Lives in [place]. Works at [employer] in [employer]      Daily Caffeine Use:  2 coffee   Regular Exercise -  NO             Review of Systems: See HPI, otherwise negative  ROS  Physical Exam: BP 124/61   Pulse 72   Temp 97.7 F (36.5 C)   Resp 16   Ht 5' (1.524 m)   Wt 142 lb (64.4 kg)   SpO2 96%   BMI 27.73 kg/m  General:   Alert,  pleasant and cooperative in NAD Head:  Normocephalic and atraumatic. Neck:  Supple; no masses or thyromegaly. Lungs:  Clear throughout to auscultation.    Heart:  Regular rate and rhythm. Abdomen:  Soft, nontender and nondistended. Normal bowel sounds, without guarding, and without rebound.   Neurologic:  Alert and  oriented x4;  grossly normal neurologically.  Impression/Plan: [name] is here for an endoscopy to be performed for Esophageal leukoplakia  Risks, benefits, limitations, and alternatives regarding  EGD have been reviewed with the patient.  Questions have been answered.  All parties agreeable.   Lucilla Lame, MD  10/05/2017, 8:32 AM

## 2017-10-06 ENCOUNTER — Encounter: Payer: Self-pay | Admitting: Gastroenterology

## 2017-10-09 ENCOUNTER — Encounter: Payer: Self-pay | Admitting: Gastroenterology

## 2017-10-10 ENCOUNTER — Encounter: Payer: Self-pay | Admitting: Gastroenterology

## 2017-11-19 DIAGNOSIS — K259 Gastric ulcer, unspecified as acute or chronic, without hemorrhage or perforation: Secondary | ICD-10-CM | POA: Diagnosis not present

## 2017-11-19 DIAGNOSIS — M25552 Pain in left hip: Secondary | ICD-10-CM | POA: Diagnosis not present

## 2017-11-19 DIAGNOSIS — M1612 Unilateral primary osteoarthritis, left hip: Secondary | ICD-10-CM | POA: Diagnosis not present

## 2017-12-07 ENCOUNTER — Other Ambulatory Visit: Payer: Self-pay

## 2017-12-07 MED ORDER — ATORVASTATIN CALCIUM 20 MG PO TABS: ORAL_TABLET | ORAL | 2 refills | Status: DC

## 2017-12-08 DIAGNOSIS — K635 Polyp of colon: Secondary | ICD-10-CM

## 2017-12-08 DIAGNOSIS — L309 Dermatitis, unspecified: Secondary | ICD-10-CM | POA: Diagnosis not present

## 2017-12-10 DIAGNOSIS — L509 Urticaria, unspecified: Secondary | ICD-10-CM | POA: Diagnosis not present

## 2017-12-12 ENCOUNTER — Other Ambulatory Visit: Payer: Self-pay

## 2017-12-12 MED ORDER — ATORVASTATIN CALCIUM 20 MG PO TABS: ORAL_TABLET | ORAL | 1 refills | Status: DC

## 2017-12-12 MED ORDER — AMLODIPINE BESYLATE 5 MG PO TABS: 5.0000 mg | ORAL_TABLET | Freq: Every day | ORAL | 2 refills | Status: DC

## 2017-12-15 DIAGNOSIS — T7840XA Allergy, unspecified, initial encounter: Secondary | ICD-10-CM | POA: Diagnosis not present

## 2017-12-24 DIAGNOSIS — L300 Nummular dermatitis: Secondary | ICD-10-CM | POA: Diagnosis not present

## 2017-12-31 ENCOUNTER — Ambulatory Visit: Payer: Self-pay | Admitting: Nurse Practitioner

## 2017-12-31 DIAGNOSIS — L239 Allergic contact dermatitis, unspecified cause: Secondary | ICD-10-CM | POA: Diagnosis not present

## 2017-12-31 DIAGNOSIS — L299 Pruritus, unspecified: Secondary | ICD-10-CM | POA: Diagnosis not present

## 2018-01-28 ENCOUNTER — Ambulatory Visit: Payer: Self-pay | Admitting: Nurse Practitioner

## 2018-02-16 IMAGING — CR DG SHOULDER 2+V*L*
1 series · 3 of 3 positions shown · non-contrast
Comparison: Left shoulder series of October 09, 2011

CLINICAL DATA: Generalize left shoulder pain for the past 8 months
with limited range of motion.

EXAM:
LEFT SHOULDER - 2+ VIEW

[Series 1: dg shoulder left · 0.14mm/px · 3 of 3 slices shown]
[im 1/3]
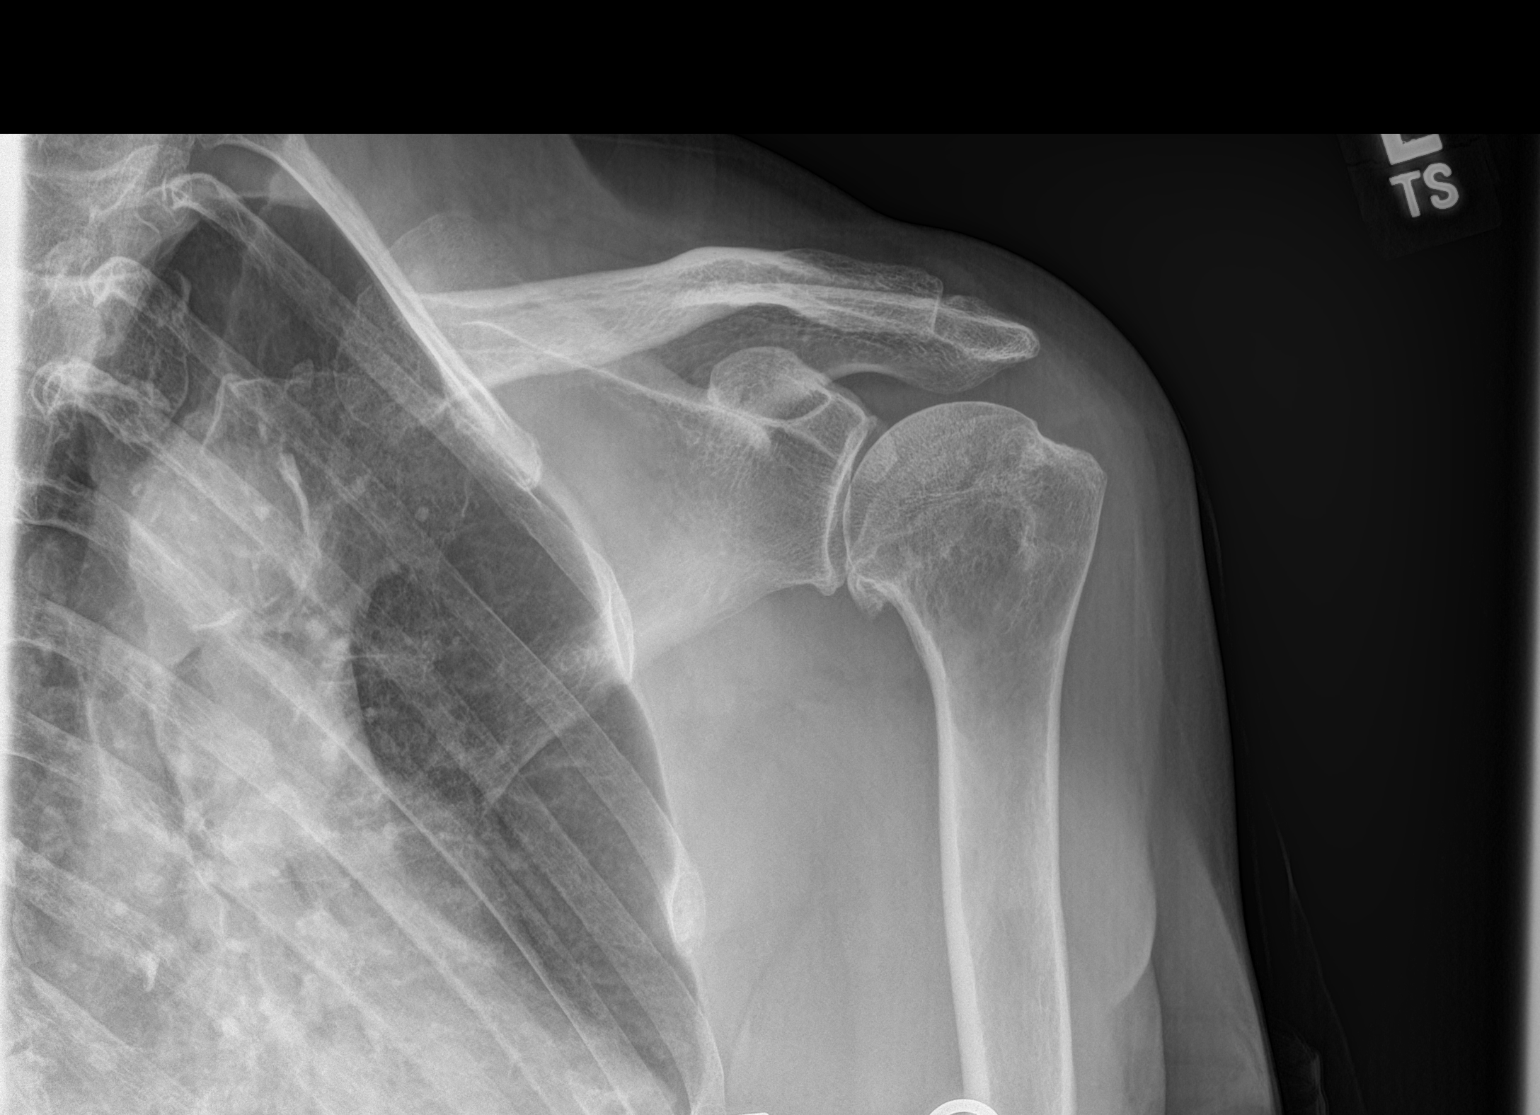
[im 2/3]
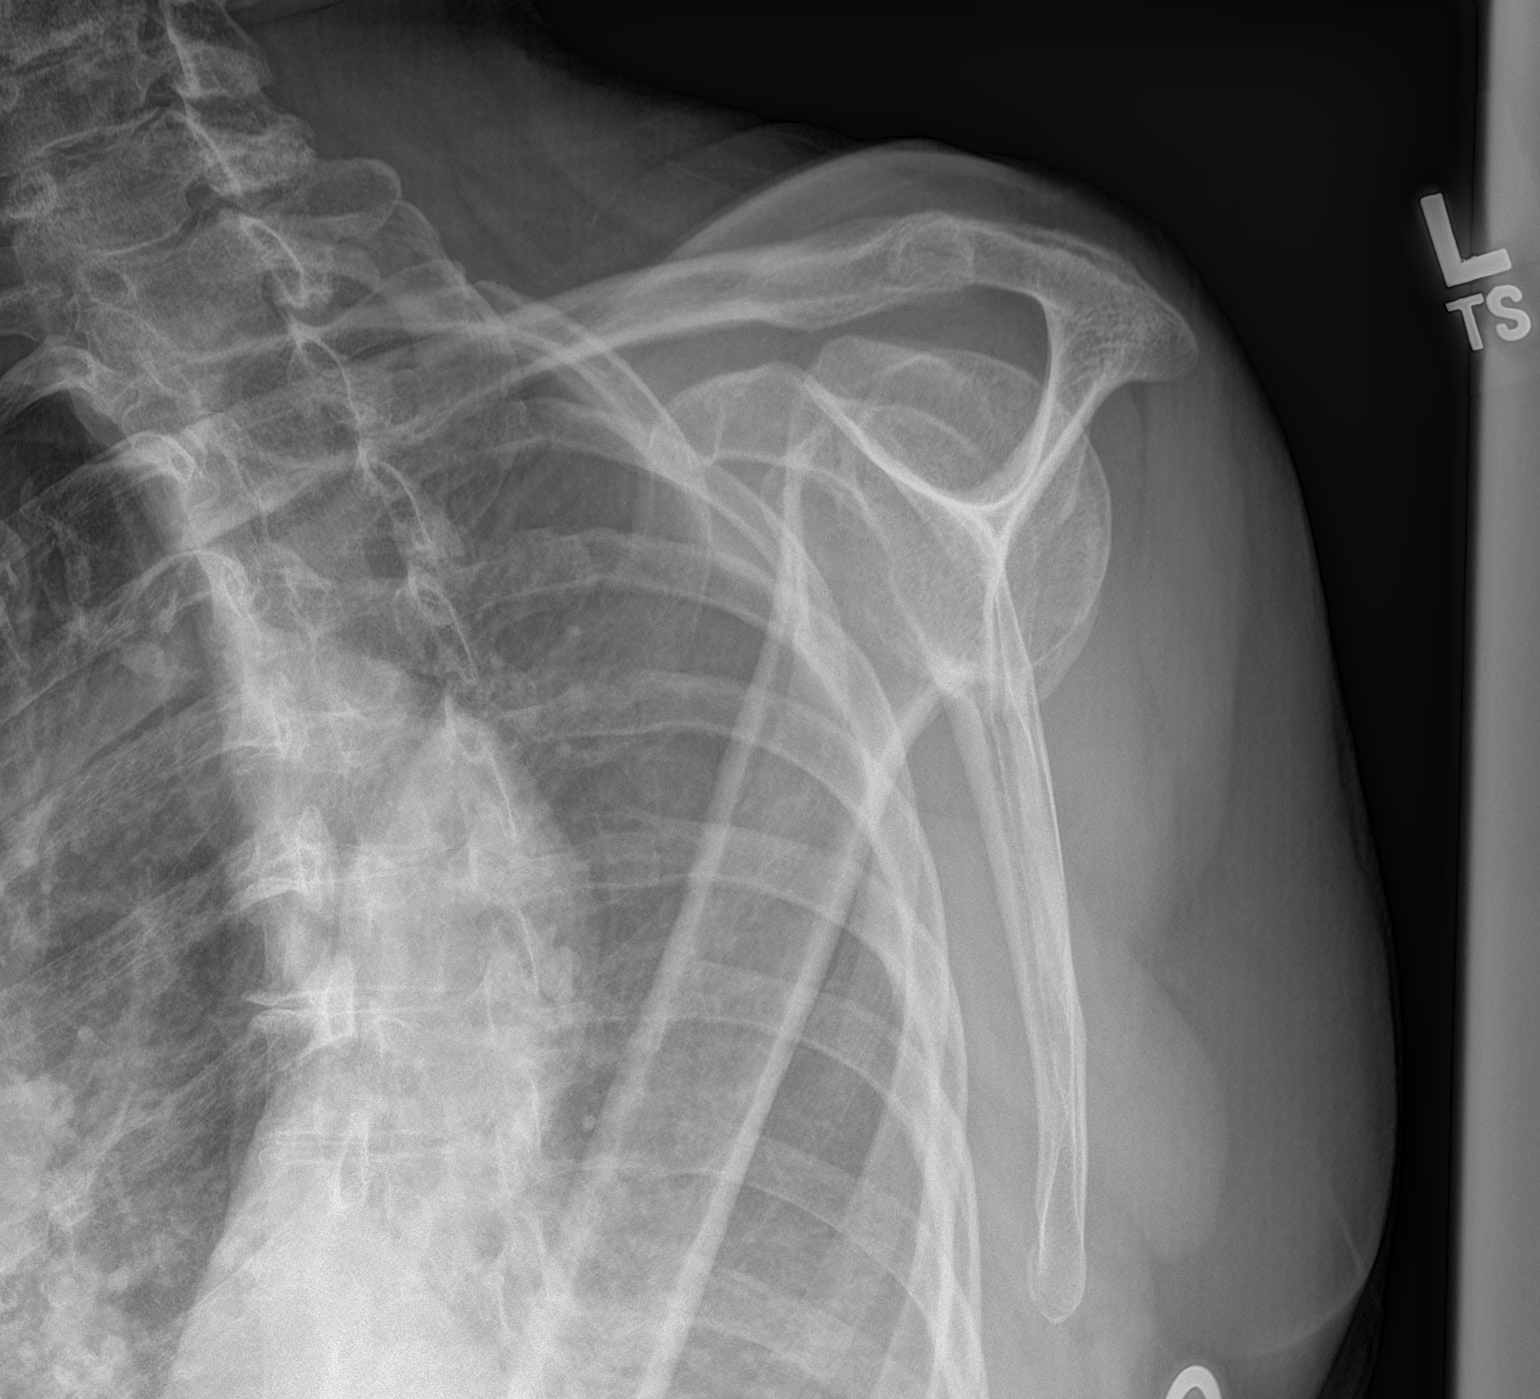
[im 3/3]
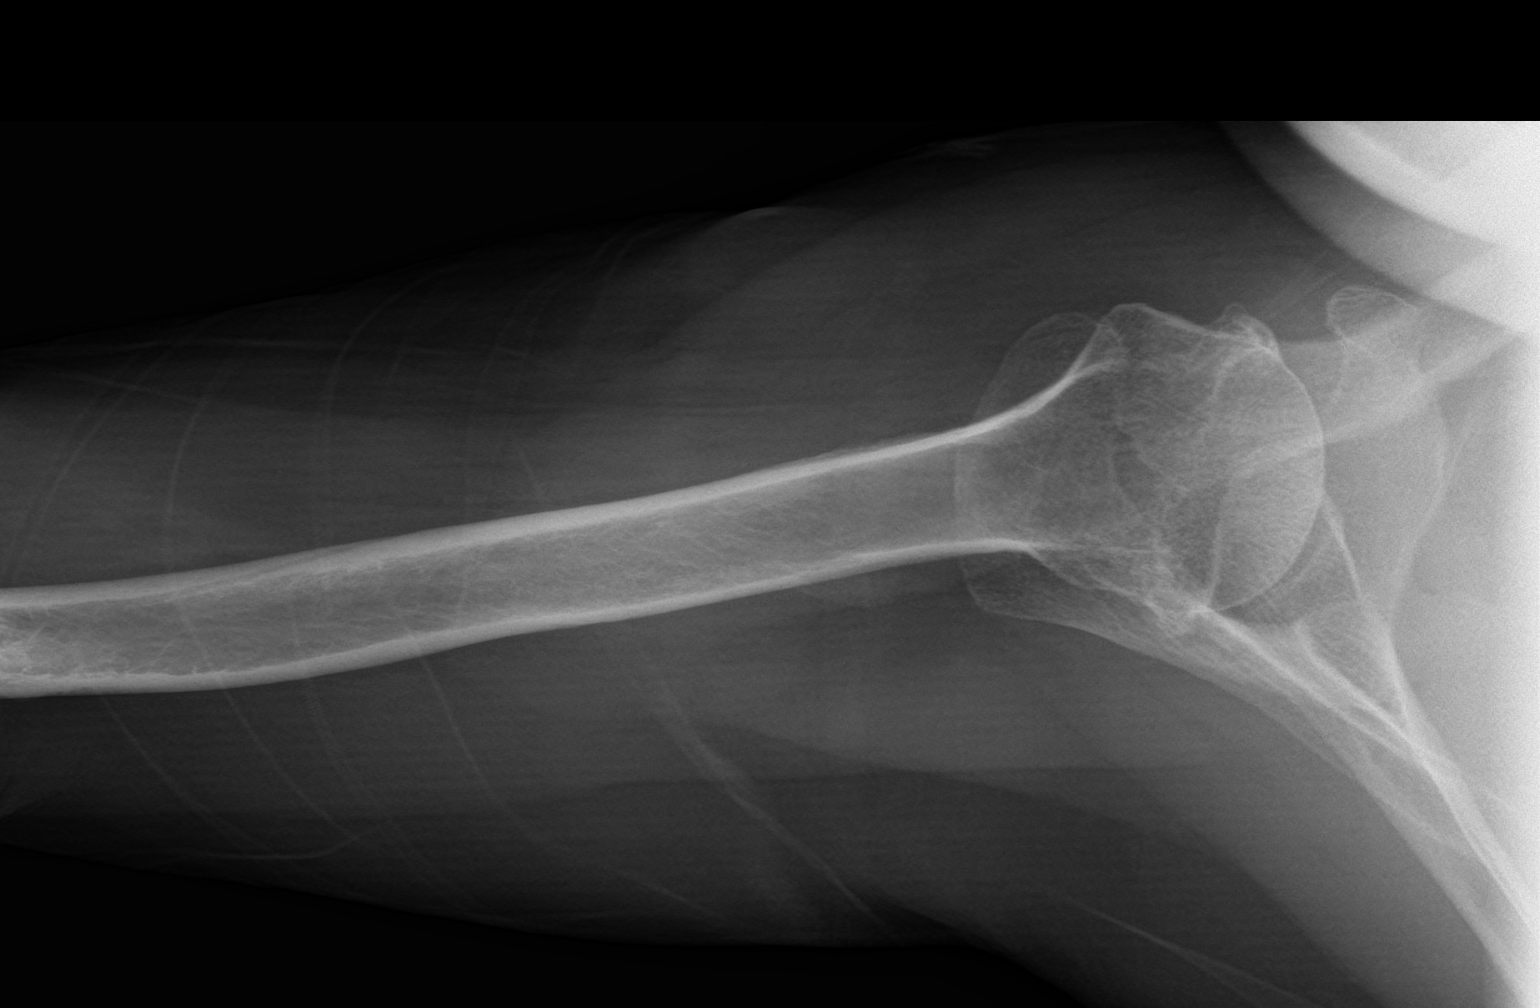

[3 of 3 positions shown; findings below may reference images not displayed]

FINDINGS: The bones are subjectively adequately mineralized. There is moderate
narrowing of the glenohumeral joint space. Spurs arise from the
inferior articular margins of the bony glenoid and the humeral head.
The subacromial subdeltoid space is normal. The AC joint is grossly
normal. There is no acute or healing fracture or dislocation.
IMPRESSION: Moderate degenerative change centered on the glenohumeral joint. No
acute bony abnormality.

## 2018-02-17 ENCOUNTER — Other Ambulatory Visit: Payer: Self-pay | Admitting: Internal Medicine

## 2018-02-17 DIAGNOSIS — G2581 Restless legs syndrome: Secondary | ICD-10-CM

## 2018-03-02 DIAGNOSIS — K219 Gastro-esophageal reflux disease without esophagitis: Secondary | ICD-10-CM | POA: Diagnosis not present

## 2018-03-02 DIAGNOSIS — G2581 Restless legs syndrome: Secondary | ICD-10-CM | POA: Diagnosis not present

## 2018-03-02 DIAGNOSIS — Z6829 Body mass index (BMI) 29.0-29.9, adult: Secondary | ICD-10-CM | POA: Diagnosis not present

## 2018-03-02 DIAGNOSIS — I10 Essential (primary) hypertension: Secondary | ICD-10-CM | POA: Diagnosis not present

## 2018-03-02 DIAGNOSIS — E663 Overweight: Secondary | ICD-10-CM | POA: Diagnosis not present

## 2018-03-02 DIAGNOSIS — E785 Hyperlipidemia, unspecified: Secondary | ICD-10-CM | POA: Diagnosis not present

## 2018-03-02 DIAGNOSIS — M545 Low back pain: Secondary | ICD-10-CM | POA: Diagnosis not present

## 2018-03-02 DIAGNOSIS — H547 Unspecified visual loss: Secondary | ICD-10-CM | POA: Diagnosis not present

## 2018-03-04 DIAGNOSIS — D2272 Melanocytic nevi of left lower limb, including hip: Secondary | ICD-10-CM | POA: Diagnosis not present

## 2018-03-04 DIAGNOSIS — L821 Other seborrheic keratosis: Secondary | ICD-10-CM | POA: Diagnosis not present

## 2018-03-04 DIAGNOSIS — L218 Other seborrheic dermatitis: Secondary | ICD-10-CM | POA: Diagnosis not present

## 2018-03-04 DIAGNOSIS — L2389 Allergic contact dermatitis due to other agents: Secondary | ICD-10-CM | POA: Diagnosis not present

## 2018-03-04 DIAGNOSIS — D2262 Melanocytic nevi of left upper limb, including shoulder: Secondary | ICD-10-CM | POA: Diagnosis not present

## 2018-03-04 DIAGNOSIS — D2271 Melanocytic nevi of right lower limb, including hip: Secondary | ICD-10-CM | POA: Diagnosis not present

## 2018-03-04 DIAGNOSIS — D2261 Melanocytic nevi of right upper limb, including shoulder: Secondary | ICD-10-CM | POA: Diagnosis not present

## 2018-03-15 ENCOUNTER — Ambulatory Visit: Payer: Self-pay | Admitting: Nurse Practitioner

## 2018-03-20 ENCOUNTER — Other Ambulatory Visit: Payer: Self-pay | Admitting: Internal Medicine

## 2018-03-20 DIAGNOSIS — G2581 Restless legs syndrome: Secondary | ICD-10-CM

## 2018-03-20 MED ORDER — ROPINIROLE HCL 1 MG PO TABS: ORAL_TABLET | ORAL | 1 refills | Status: DC

## 2018-03-22 ENCOUNTER — Other Ambulatory Visit: Payer: Self-pay

## 2018-03-22 DIAGNOSIS — G2581 Restless legs syndrome: Secondary | ICD-10-CM

## 2018-03-22 MED ORDER — ROPINIROLE HCL 1 MG PO TABS: ORAL_TABLET | ORAL | 3 refills | Status: DC

## 2018-03-28 DIAGNOSIS — G2581 Restless legs syndrome: Secondary | ICD-10-CM | POA: Diagnosis not present

## 2018-04-04 ENCOUNTER — Telehealth: Payer: Self-pay

## 2018-04-04 ENCOUNTER — Telehealth: Payer: Self-pay | Admitting: Nurse Practitioner

## 2018-04-04 NOTE — Telephone Encounter (Signed)
Note sent to provider 

## 2018-04-04 NOTE — Telephone Encounter (Signed)
Please let her know it wasn't filled because she hasn't been seen in over 6 months. She has appointment scheduled 02/06/2018 to talk about this issue.

## 2018-04-04 NOTE — Telephone Encounter (Signed)
Spoke with pharmacist at [facility] is denying the request the pharmacist informed me that she will call [facility] as well as the pt to let her know exactly why its being denied.

## 2018-04-05 ENCOUNTER — Other Ambulatory Visit: Payer: Self-pay

## 2018-04-08 ENCOUNTER — Ambulatory Visit: Payer: Medicare PPO | Admitting: Nurse Practitioner

## 2018-04-08 ENCOUNTER — Encounter: Payer: Self-pay | Admitting: Nurse Practitioner

## 2018-04-08 ENCOUNTER — Telehealth: Payer: Self-pay

## 2018-04-08 VITALS — BP 142/80 | HR 63 | Resp 16 | Ht 59.0 in | Wt 145.0 lb

## 2018-04-08 DIAGNOSIS — R635 Abnormal weight gain: Secondary | ICD-10-CM | POA: Insufficient documentation

## 2018-04-08 DIAGNOSIS — M25552 Pain in left hip: Secondary | ICD-10-CM

## 2018-04-08 DIAGNOSIS — E782 Mixed hyperlipidemia: Secondary | ICD-10-CM

## 2018-04-08 DIAGNOSIS — G2581 Restless legs syndrome: Secondary | ICD-10-CM

## 2018-04-08 DIAGNOSIS — G8929 Other chronic pain: Secondary | ICD-10-CM | POA: Diagnosis not present

## 2018-04-08 DIAGNOSIS — M25512 Pain in left shoulder: Secondary | ICD-10-CM

## 2018-04-08 DIAGNOSIS — R5383 Other fatigue: Secondary | ICD-10-CM | POA: Diagnosis not present

## 2018-04-08 DIAGNOSIS — E559 Vitamin D deficiency, unspecified: Secondary | ICD-10-CM | POA: Insufficient documentation

## 2018-04-08 MED ORDER — DICLOFENAC SODIUM 1 % TD GEL: 4.0000 g | Freq: Four times a day (QID) | TRANSDERMAL | 3 refills | Status: DC

## 2018-04-08 MED ORDER — ROPINIROLE HCL 2 MG PO TABS: ORAL_TABLET | ORAL | 3 refills | Status: DC

## 2018-04-08 MED ORDER — ROPINIROLE HCL 1 MG PO TABS: ORAL_TABLET | ORAL | 3 refills | Status: DC

## 2018-04-08 NOTE — Telephone Encounter (Signed)
Spoke with pt that we send new pres for ropinirole 2 mg 1 tab bid send to [facility]

## 2018-04-08 NOTE — Progress Notes (Signed)
[facility] [place], [place] [address]  Internal MEDICINE  Office Visit Note  Patient Name: [name]  [id number]  [id number]  Date of Service: 04/08/2018   Pt is here for routine follow up.   Chief Complaint  Patient presents with  . Leg Problem    going on for few months  . Hip Pain    left  . Shoulder Pain    left    The patient has long history of RLS which has become gradually more severe. Started with ropinirole as needed, then had to start taking this at night. Then had to take twice daily, then needed more medication. Last prescription was written for ropinirole 1mg , two tablets twice daily. Pharmacy/insurance would not fill the prescription this way. Wanted her to take 2mg  tablets twice daily. She has been without any of ropinirole for several days. She did [place]. They gave her a different medication to treat the RLS, but she does not have any more of this.  States that she has left hip pain. Feels weak, especially when going up the stairs. Has to put most of er weight on the right leg in order to bring up the left leg. Continues to have left shoulder pain. ROM is decreased due to pain. Has a hard time putting on her bra because her arm will no longer reach across her back. Strength is also decreased. She did have x-ray in November showing moderate degenerative changes.       Current Medication: Outpatient Encounter Medications as of 04/08/2018  Medication Sig  . atorvastatin (LIPITOR) 20 MG tablet TAKE 1 TABLET EVERY DAY USUALLY IN THE EVENING  . esomeprazole (NEXIUM) 40 MG capsule   . fexofenadine-pseudoephedrine (ALLEGRA-D ALLERGY & CONGESTION) 180-240 MG 24 hr tablet Take 1 tablet by mouth daily.  . fluticasone (FLONASE) 50 MCG/ACT nasal spray USE 2 SPRAYS IN EACH NOSTRIL EVERY DAY  . rOPINIRole (REQUIP) 1 MG tablet Take 1 tablet in AM, 1 tablet at lunch, and 2 tablets at bedtime.  .  [DISCONTINUED] rOPINIRole (REQUIP) 1 MG tablet TAKE 2 TABLETS AT LUNCH AND 2 TABLETS AT BEDTIME  . amLODipine (NORVASC) 5 MG tablet Take 1 tablet (5 mg total) by mouth daily.  . cefdinir (OMNICEF) 300 MG capsule TAKE 1 CAPSULE EVERY 12 HOURS  . diclofenac sodium (VOLTAREN) 1 % GEL Apply 4 g topically 4 (four) times daily.  . methylPREDNISolone (MEDROL DOSEPAK) 4 MG TBPK tablet See admin instructions.  . [DISCONTINUED] ALPRAZolam (XANAX) 0.25 MG tablet Take 1 tablet (0.25 mg total) by mouth 2 (two) times daily as needed for anxiety. (Patient not taking: Reported on 09/26/2017)   No facility-administered encounter medications on file as of 04/08/2018.     Surgical History: Past Surgical History:  Procedure Laterality Date  . COLONOSCOPY WITH PROPOFOL N/A 06/18/2017   Procedure: COLONOSCOPY WITH PROPOFOL;  Surgeon: Lucilla Lame, MD;  Location: [place];  Service: Endoscopy;  Laterality: N/A;  . ESOPHAGOGASTRODUODENOSCOPY (EGD) WITH PROPOFOL N/A 06/18/2017   Procedure: ESOPHAGOGASTRODUODENOSCOPY (EGD) WITH PROPOFOL;  Surgeon: Lucilla Lame, MD;  Location: [place];  Service: Endoscopy;  Laterality: N/A;  . ESOPHAGOGASTRODUODENOSCOPY (EGD) WITH PROPOFOL N/A 10/05/2017   Procedure: ESOPHAGOGASTRODUODENOSCOPY (EGD) WITH PROPOFOL;  Surgeon: Lucilla Lame, MD;  Location: [place];  Service: Endoscopy;  Laterality: N/A;  . POLYPECTOMY  06/18/2017   Procedure: POLYPECTOMY;  Surgeon: Lucilla Lame, MD;  Location: [place];  Service: Endoscopy;;  . TUBAL LIGATION  1980    Medical History: Past Medical History:  Diagnosis Date  . Asthma   . H/O exercise stress test    at 25 years age  . Heart murmur   . Hyperlipidemia   . Hypertension   . Jaundice    In High school  . Jaundice    hx     Family History: Family History  Problem Relation Age of Onset  . Heart attack Mother   . Heart disease Mother 45  . Heart attack Father   . Heart disease Father 79    . Diabetes Sister   . Heart disease Brother   . Heart disease Brother   . Diabetes Maternal Grandmother   . Breast cancer Other   . Colon cancer Neg Hx     Social History   Socioeconomic History  . Marital status: Married    Spouse name: Not on file  . Number of children: 3  . Years of education: Not on file  . Highest education level: Not on file  Occupational History  . Occupation: Self Employed - Hairdresser  Social Needs  . Financial resource strain: Not on file  . Food insecurity:    Worry: Not on file    Inability: Not on file  . Transportation needs:    Medical: Not on file    Non-medical: Not on file  Tobacco Use  . Smoking status: Never Smoker  . Smokeless tobacco: Never Used  Substance and Sexual Activity  . Alcohol use: Yes    Alcohol/week: 4.2 oz    Types: 7 Glasses of wine per week    Comment: Wine nightly  . Drug use: No  . Sexual activity: Not on file  Lifestyle  . Physical activity:    Days per week: Not on file    Minutes per session: Not on file  . Stress: Not on file  Relationships  . Social connections:    Talks on phone: Not on file    Gets together: Not on file    Attends religious service: Not on file    Active member of club or organization: Not on file    Attends meetings of clubs or organizations: Not on file    Relationship status: Not on file  . Intimate partner violence:    Fear of current or ex partner: Not on file    Emotionally abused: Not on file    Physically abused: Not on file    Forced sexual activity: Not on file  Other Topics Concern  . Not on file  Social History Narrative   Lives in [place]. Works at [employer] in [employer]      Daily Caffeine Use:  2 coffee   Regular Exercise -  NO               Review of Systems  Constitutional: Negative for activity change, chills, fatigue and unexpected weight change.  HENT: Negative for congestion, postnasal drip, rhinorrhea, sneezing and sore throat.   Eyes:  Negative.  Negative for redness.  Respiratory: Negative for cough, chest tightness, shortness of breath and wheezing.   Cardiovascular: Negative for chest pain and palpitations.  Gastrointestinal: Negative for abdominal pain, constipation, diarrhea, nausea and vomiting.  Endocrine: Negative for cold intolerance, heat intolerance, polydipsia, polyphagia and polyuria.  Genitourinary: Negative for dysuria and frequency.  Musculoskeletal: Positive for arthralgias and myalgias. Negative for back pain, joint swelling and neck pain.  Skin: Negative for  rash.  Allergic/Immunologic: Negative for environmental allergies.  Neurological: Negative for tremors, numbness and headaches.       Worsenign restless legs.   Hematological: Negative for adenopathy. Does not bruise/bleed easily.  Psychiatric/Behavioral: Negative for behavioral problems (Depression), dysphoric mood, sleep disturbance and suicidal ideas. The patient is not nervous/anxious.     Today's Vitals   04/08/18 0900  BP: (!) 142/80  Pulse: 63  Resp: 16  SpO2: 96%  Weight: 145 lb (65.8 kg)  Height: 4\' 11"  (1.499 m)    Physical Exam  Constitutional: She is oriented to person, place, and time. She appears well-developed and well-nourished. No distress.  HENT:  Head: Normocephalic and atraumatic.  Mouth/Throat: Oropharynx is clear and moist. No oropharyngeal exudate.  Eyes: Pupils are equal, round, and reactive to light. EOM are normal.  Neck: Normal range of motion. Neck supple. No JVD present. No tracheal deviation present. No thyromegaly present.  Cardiovascular: Normal rate, regular rhythm and normal heart sounds. Exam reveals no gallop and no friction rub.  No murmur heard. Pulmonary/Chest: Effort normal and breath sounds normal. No respiratory distress. She has no wheezes. She has no rales. She exhibits no tenderness.  Abdominal: Soft. Bowel sounds are normal.  Musculoskeletal: Normal range of motion.       Arms:       Legs: Lymphadenopathy:    She has no cervical adenopathy.  Neurological: She is alert and oriented to person, place, and time. No cranial nerve deficit.  Skin: Skin is warm and dry. She is not diaphoretic.  Psychiatric: She has a normal mood and affect. Her behavior is normal. Judgment and thought content normal.  Nursing note and vitals reviewed.  Assessment/Plan: 1. Restless leg syndrome Change dosing of ropinirole 1mg  to take 1 in AM, 1 in afternoon, and 2 at bedtime. Will reassess at next visit.  - rOPINIRole (REQUIP) 1 MG tablet; Take 1 tablet in AM, 1 tablet at lunch, and 2 tablets at bedtime.  Dispense: 120 tablet; Refill: 3  2. Chronic left shoulder pain Reviewed left shoulder x-ray showing moderate degenerative changes. Add diclofenac gel. Apply to affected are four times daily as needed. Refer to orthopedics for farther evaluation.  - Ambulatory referral to Orthopedic Surgery - diclofenac sodium (VOLTAREN) 1 % GEL; Apply 4 g topically 4 (four) times daily.  Dispense: 100 g; Refill: 3  3. Left hip pain voltaren gel a needed and as prescribed. Discuss with orthopedic provider.   4. Fatigue, unspecified type - CBC with Differential/Platelet - Comprehensive metabolic panel - TSH  5. Abnormal weight gain - T4, free - TSH  6. Vitamin D deficiency - Vitamin D 1,25 dihydroxy  7. Mixed hyperlipidemia - Lipid panel  General Counseling: Pelagia verbalizes understanding of the findings of todays visit and agrees with plan of treatment. I have discussed any further diagnostic evaluation that may be needed or ordered today. We also reviewed her medications today. she has been encouraged to call the office with any questions or concerns that should arise related to todays visit.    Counseling:  This patient was seen by Leretha Pol, FNP- C in Collaboration with Dr Lavera Guise as a part of collaborative care agreement   Orders Placed This Encounter  Procedures  . CBC with  Differential/Platelet  . Comprehensive metabolic panel  . T4, free  . TSH  . Lipid panel  . Vitamin D 1,25 dihydroxy  . Ambulatory referral to Orthopedic Surgery    Meds ordered this encounter  Medications  . rOPINIRole (REQUIP) 1 MG tablet    Sig: Take 1 tablet in AM, 1 tablet at lunch, and 2 tablets at bedtime.    Dispense:  120 tablet    Refill:  3    Order Specific Question:   Supervising Provider    Answer:   Lavera Guise [[id number]]  . diclofenac sodium (VOLTAREN) 1 % GEL    Sig: Apply 4 g topically 4 (four) times daily.    Dispense:  100 g    Refill:  3    Order Specific Question:   Supervising Provider    Answer:   Lavera Guise [[id number]]    Time spent: 9 Minutes       Dr Lavera Guise Internal medicine

## 2018-04-13 LAB — CBC WITH DIFFERENTIAL/PLATELET
BASOS ABS: 0 10*3/uL (ref 0.0–0.2)
Basos: 1 %
EOS (ABSOLUTE): 0.1 10*3/uL (ref 0.0–0.4)
Eos: 2 %
HEMATOCRIT: 42.1 % (ref 34.0–46.6)
HEMOGLOBIN: 14.2 g/dL (ref 11.1–15.9)
Immature Grans (Abs): 0 10*3/uL (ref 0.0–0.1)
Immature Granulocytes: 0 %
LYMPHS ABS: 1.8 10*3/uL (ref 0.7–3.1)
Lymphs: 22 %
MCH: 27.8 pg (ref 26.6–33.0)
MCHC: 33.7 g/dL (ref 31.5–35.7)
MCV: 82 fL (ref 79–97)
MONOCYTES: 8 %
MONOS ABS: 0.7 10*3/uL (ref 0.1–0.9)
NEUTROS ABS: 5.6 10*3/uL (ref 1.4–7.0)
Neutrophils: 67 %
Platelets: 292 10*3/uL (ref 150–450)
RBC: 5.11 x10E6/uL (ref 3.77–5.28)
RDW: 13.2 % (ref 12.3–15.4)
WBC: 8.3 10*3/uL (ref 3.4–10.8)

## 2018-04-13 LAB — COMPREHENSIVE METABOLIC PANEL
A/G RATIO: 2.1 (ref 1.2–2.2)
ALT: 54 IU/L — ABNORMAL HIGH (ref 0–32)
AST: 37 IU/L (ref 0–40)
Albumin: 4.4 g/dL (ref 3.5–4.8)
Alkaline Phosphatase: 76 IU/L (ref 39–117)
BILIRUBIN TOTAL: 0.4 mg/dL (ref 0.0–1.2)
BUN / CREAT RATIO: 33 — AB (ref 12–28)
BUN: 15 mg/dL (ref 8–27)
CALCIUM: 9.6 mg/dL (ref 8.7–10.3)
CHLORIDE: 105 mmol/L (ref 96–106)
CO2: 24 mmol/L (ref 20–29)
Creatinine, Ser: 0.46 mg/dL — ABNORMAL LOW (ref 0.57–1.00)
GFR, EST AFRICAN AMERICAN: 117 mL/min/{1.73_m2} (ref >59)
GFR, EST NON AFRICAN AMERICAN: 101 mL/min/{1.73_m2} (ref >59)
GLOBULIN, TOTAL: 2.1 g/dL (ref 1.5–4.5)
Glucose: 100 mg/dL — ABNORMAL HIGH (ref 65–99)
POTASSIUM: 4.5 mmol/L (ref 3.5–5.2)
SODIUM: 142 mmol/L (ref 134–144)
TOTAL PROTEIN: 6.5 g/dL (ref 6.0–8.5)

## 2018-04-13 LAB — LIPID PANEL
CHOL/HDL RATIO: 2.3 ratio (ref 0.0–4.4)
Cholesterol, Total: 188 mg/dL (ref 100–199)
HDL: 82 mg/dL (ref >39)
LDL CALC: 94 mg/dL (ref 0–99)
Triglycerides: 60 mg/dL (ref 0–149)
VLDL CHOLESTEROL CAL: 12 mg/dL (ref 5–40)

## 2018-04-13 LAB — T4, FREE: Free T4: 1 ng/dL (ref 0.82–1.77)

## 2018-04-13 LAB — VITAMIN D 1,25 DIHYDROXY
VITAMIN D 1, 25 (OH) TOTAL: 40 pg/mL
Vitamin D2 1, 25 (OH)2: <10 pg/mL
Vitamin D3 1, 25 (OH)2: 38 pg/mL

## 2018-04-13 LAB — TSH: TSH: 2.47 u[IU]/mL (ref 0.450–4.500)

## 2018-05-13 DIAGNOSIS — M19012 Primary osteoarthritis, left shoulder: Secondary | ICD-10-CM | POA: Diagnosis not present

## 2018-05-13 DIAGNOSIS — M7062 Trochanteric bursitis, left hip: Secondary | ICD-10-CM | POA: Diagnosis not present

## 2018-06-14 ENCOUNTER — Encounter: Payer: Self-pay | Admitting: Nurse Practitioner

## 2018-06-14 DIAGNOSIS — S76012A Strain of muscle, fascia and tendon of left hip, initial encounter: Secondary | ICD-10-CM | POA: Diagnosis not present

## 2018-06-14 DIAGNOSIS — M47816 Spondylosis without myelopathy or radiculopathy, lumbar region: Secondary | ICD-10-CM | POA: Diagnosis not present

## 2018-06-14 DIAGNOSIS — M545 Low back pain: Secondary | ICD-10-CM | POA: Diagnosis not present

## 2018-06-14 DIAGNOSIS — M25552 Pain in left hip: Secondary | ICD-10-CM | POA: Diagnosis not present

## 2018-06-14 DIAGNOSIS — M7062 Trochanteric bursitis, left hip: Secondary | ICD-10-CM | POA: Diagnosis not present

## 2018-06-21 ENCOUNTER — Ambulatory Visit: Payer: Medicare PPO | Admitting: Adult Health

## 2018-06-21 ENCOUNTER — Encounter: Payer: Self-pay | Admitting: Adult Health

## 2018-06-21 VITALS — BP 138/78 | HR 79 | Resp 16 | Ht 59.0 in | Wt 139.0 lb

## 2018-06-21 DIAGNOSIS — E782 Mixed hyperlipidemia: Secondary | ICD-10-CM

## 2018-06-21 DIAGNOSIS — G2581 Restless legs syndrome: Secondary | ICD-10-CM

## 2018-06-21 DIAGNOSIS — R300 Dysuria: Secondary | ICD-10-CM

## 2018-06-21 DIAGNOSIS — Z0001 Encounter for general adult medical examination with abnormal findings: Secondary | ICD-10-CM | POA: Diagnosis not present

## 2018-06-21 DIAGNOSIS — K219 Gastro-esophageal reflux disease without esophagitis: Secondary | ICD-10-CM

## 2018-06-21 DIAGNOSIS — R0602 Shortness of breath: Secondary | ICD-10-CM | POA: Diagnosis not present

## 2018-06-21 DIAGNOSIS — I10 Essential (primary) hypertension: Secondary | ICD-10-CM

## 2018-06-21 NOTE — Patient Instructions (Signed)

## 2018-06-21 NOTE — Progress Notes (Signed)
[facility] [place], [place] [address]  Internal MEDICINE  Office Visit Note  Patient Name: [name]  [id number]  [id number]  Date of Service: 07/07/2018  Chief Complaint  Patient presents with  . Annual Exam  . Shortness of Breath  . Hypertension  . Hyperlipidemia   HPI Pt is here for routine health maintenance examination.  She has a history of HTN, HLD, GERD and restless leg syndrome.  She reports continued difficulty at times due to restless leg syndrome.  She is taking Requip daily. She is generally in good health.  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 She also reports episodes where she has to take a deep breath in order to catch her breath.  It does not occur everyday, but is a few times a week.  These episodes typically happen when she is working, but they occur at home at times. She is concerned with her familial history that her heart may be the cause.    Current Medication: Outpatient Encounter Medications as of 06/21/2018  Medication Sig  . amLODipine (NORVASC) 5 MG tablet Take 1 tablet (5 mg total) by mouth daily.  Marland Kitchen atorvastatin (LIPITOR) 20 MG tablet TAKE 1 TABLET EVERY DAY USUALLY IN THE EVENING  . esomeprazole (NEXIUM) 40 MG capsule   . fexofenadine-pseudoephedrine (ALLEGRA-D ALLERGY & CONGESTION) 180-240 MG 24 hr tablet Take 1 tablet by mouth daily.  . fluticasone (FLONASE) 50 MCG/ACT nasal spray USE 2 SPRAYS IN EACH NOSTRIL EVERY DAY  . rOPINIRole (REQUIP) 2 MG tablet Take 1 tablet po BID prn  . [DISCONTINUED] cefdinir (OMNICEF) 300 MG capsule TAKE 1 CAPSULE EVERY 12 HOURS  . [DISCONTINUED] diclofenac sodium (VOLTAREN) 1 % GEL Apply 4 g topically 4 (four) times daily. (Patient not taking: Reported on 06/21/2018)  . [DISCONTINUED] methylPREDNISolone (MEDROL DOSEPAK) 4 MG TBPK tablet See admin instructions.   No facility-administered encounter medications on file as of 06/21/2018.     Surgical History: Past  Surgical History:  Procedure Laterality Date  . COLONOSCOPY WITH PROPOFOL N/A 06/18/2017   Procedure: COLONOSCOPY WITH PROPOFOL;  Surgeon: Lucilla Lame, MD;  Location: [place];  Service: Endoscopy;  Laterality: N/A;  . ESOPHAGOGASTRODUODENOSCOPY (EGD) WITH PROPOFOL N/A 06/18/2017   Procedure: ESOPHAGOGASTRODUODENOSCOPY (EGD) WITH PROPOFOL;  Surgeon: Lucilla Lame, MD;  Location: [place];  Service: Endoscopy;  Laterality: N/A;  . ESOPHAGOGASTRODUODENOSCOPY (EGD) WITH PROPOFOL N/A 10/05/2017   Procedure: ESOPHAGOGASTRODUODENOSCOPY (EGD) WITH PROPOFOL;  Surgeon: Lucilla Lame, MD;  Location: [place];  Service: Endoscopy;  Laterality: N/A;  . POLYPECTOMY  06/18/2017   Procedure: POLYPECTOMY;  Surgeon: Lucilla Lame, MD;  Location: [place];  Service: Endoscopy;;  . TUBAL LIGATION  1980    Medical History: Past Medical History:  Diagnosis Date  . Asthma   . H/O exercise stress test    at 63 years age  . Heart murmur   . Hyperlipidemia   . Hypertension   . Jaundice    In High school  . Jaundice    hx     Family History: Family History  Problem Relation Age of Onset  . Heart attack Mother   . Heart disease Mother 56  . Heart attack Father   . Heart disease Father 62  . Diabetes Sister   . Heart disease Brother   . Heart disease Brother   . Diabetes Maternal Grandmother   . Breast cancer Other   . Colon cancer Neg Hx  Review of Systems  Constitutional: Negative for chills, fatigue and unexpected weight change.  HENT: Negative for congestion, rhinorrhea, sneezing and sore throat.   Eyes: Negative for photophobia, pain and redness.  Respiratory: Negative for cough, chest tightness and shortness of breath.   Cardiovascular: Negative for chest pain and palpitations.  Gastrointestinal: Negative for abdominal pain, constipation, diarrhea, nausea and vomiting.  Endocrine: Negative.   Genitourinary: Negative for dysuria and frequency.   Musculoskeletal: Negative for arthralgias, back pain, joint swelling and neck pain.  Skin: Negative for rash.  Allergic/Immunologic: Negative.   Neurological: Negative for tremors and numbness.  Hematological: Negative for adenopathy. Does not bruise/bleed easily.  Psychiatric/Behavioral: Negative for behavioral problems and sleep disturbance. The patient is not nervous/anxious.    Vital Signs: BP 138/78   Pulse 79   Resp 16   Ht 4\' 11"  (1.499 m)   Wt 139 lb (63 kg)   SpO2 97%   BMI 28.07 kg/m   Physical Exam  Constitutional: She is oriented to person, place, and time. She appears well-developed and well-nourished. No distress.  HENT:  Head: Normocephalic and atraumatic.  Mouth/Throat: Oropharynx is clear and moist. No oropharyngeal exudate.  Eyes: Pupils are equal, round, and reactive to light. EOM are normal.  Neck: Normal range of motion. Neck supple. No JVD present. No tracheal deviation present. No thyromegaly present.  Cardiovascular: Normal rate, regular rhythm and normal heart sounds. Exam reveals no gallop and no friction rub.  No murmur heard. Pulmonary/Chest: Effort normal and breath sounds normal. No respiratory distress. She has no wheezes. She has no rales. She exhibits no tenderness.  Abdominal: Soft. There is no tenderness. There is no guarding.  Musculoskeletal: Normal range of motion.  Lymphadenopathy:    She has no cervical adenopathy.  Neurological: She is alert and oriented to person, place, and time. No cranial nerve deficit.  Skin: Skin is warm and dry. She is not diaphoretic.  Psychiatric: She has a normal mood and affect. Her behavior is normal. Judgment and thought content normal.  Nursing note and vitals reviewed.    LABS: Recent Results (from the past 2160 hour(s))  UA/M w/rflx Culture, Routine     Status: None   Collection Time: 06/21/18 11:07 AM  Result Value Ref Range   Specific Gravity, UA 1.027 1.005 - 1.030   pH, UA 5.0 5.0 - 7.5    Color, UA Yellow Yellow   Appearance Ur Clear Clear   Leukocytes, UA Negative Negative   Protein, UA Negative Negative/Trace   Glucose, UA Negative Negative   Ketones, UA Negative Negative   RBC, UA Negative Negative   Bilirubin, UA Negative Negative   Urobilinogen, Ur 0.2 0.2 - 1.0 mg/dL   Nitrite, UA Negative Negative   Microscopic Examination Comment     Comment: Microscopic follows if indicated.   Microscopic Examination See below:     Comment: Microscopic was indicated and was performed.   Urinalysis Reflex Comment     Comment: This specimen will not reflex to a Urine Culture.  Microscopic Examination     Status: None   Collection Time: 06/21/18 11:07 AM  Result Value Ref Range   WBC, UA 0-5 0 - 5 /hpf   RBC, UA 0-2 0 - 2 /hpf   Epithelial Cells (non renal) 0-10 0 - 10 /hpf   Casts None seen None seen /lpf   Mucus, UA Present Not Estab.   Bacteria, UA None seen None seen/Few    Assessment/Plan: 1. Encounter for  general adult medical examination with abnormal findings Pt doing well.  Continue to take medications as directed.    2. Restless leg syndrome Continue Requip.  3. Mixed hyperlipidemia Continue Lipitor as directed.   4. Essential hypertension Pt has poor compliance with Amlodipine.  She states she forgets to take it.  She has been doing better.  Encouraged compliance.   5. Gastroesophageal reflux disease without esophagitis Continue taking Nexium as needed for reflux.  Controlled at this time.   6. SOB (shortness of breath) Pt has episodes of SOB with exertion.  Will get echo due to family history of cardiac issues.  - ECHOCARDIOGRAM COMPLETE; Future  7. Dysuria - UA/M w/rflx Culture, Routine  General Counseling: [name] verbalizes understanding of the findings of todays visit and agrees with plan of treatment. I have discussed any further diagnostic evaluation that may be needed or ordered today. We also reviewed her medications today. she has been  encouraged to call the office with any questions or concerns that should arise related to todays visit.   Orders Placed This Encounter  Procedures  . Microscopic Examination  . UA/M w/rflx Culture, Routine  . ECHOCARDIOGRAM COMPLETE    Time spent: 25 Minutes   This patient was seen by Orson Gear AGNP-C in Collaboration with Dr Lavera Guise as a part of collaborative care agreement   Lavera Guise, MD  Internal Medicine

## 2018-06-22 LAB — UA/M W/RFLX CULTURE, ROUTINE
Bilirubin, UA: NEGATIVE
Glucose, UA: NEGATIVE
Ketones, UA: NEGATIVE
LEUKOCYTES UA: NEGATIVE
NITRITE UA: NEGATIVE
PH UA: 5 (ref 5.0–7.5)
Protein, UA: NEGATIVE
RBC UA: NEGATIVE
Specific Gravity, UA: 1.027 (ref 1.005–1.030)
Urobilinogen, Ur: 0.2 mg/dL (ref 0.2–1.0)

## 2018-06-22 LAB — MICROSCOPIC EXAMINATION
Bacteria, UA: NONE SEEN
CASTS: NONE SEEN /LPF

## 2018-07-12 ENCOUNTER — Ambulatory Visit: Payer: Medicare PPO

## 2018-07-12 DIAGNOSIS — R0602 Shortness of breath: Secondary | ICD-10-CM

## 2018-07-29 ENCOUNTER — Ambulatory Visit: Payer: Self-pay | Admitting: Adult Health

## 2018-08-02 ENCOUNTER — Ambulatory Visit: Payer: Medicare PPO | Admitting: Adult Health

## 2018-08-02 ENCOUNTER — Encounter: Payer: Self-pay | Admitting: Adult Health

## 2018-08-02 ENCOUNTER — Other Ambulatory Visit: Payer: Self-pay | Admitting: Student

## 2018-08-02 VITALS — BP 162/89 | HR 66 | Resp 16 | Ht 59.0 in | Wt 136.0 lb

## 2018-08-02 DIAGNOSIS — K219 Gastro-esophageal reflux disease without esophagitis: Secondary | ICD-10-CM

## 2018-08-02 DIAGNOSIS — M7062 Trochanteric bursitis, left hip: Secondary | ICD-10-CM | POA: Diagnosis not present

## 2018-08-02 DIAGNOSIS — R41840 Attention and concentration deficit: Secondary | ICD-10-CM | POA: Diagnosis not present

## 2018-08-02 DIAGNOSIS — E782 Mixed hyperlipidemia: Secondary | ICD-10-CM

## 2018-08-02 DIAGNOSIS — Z23 Encounter for immunization: Secondary | ICD-10-CM | POA: Diagnosis not present

## 2018-08-02 DIAGNOSIS — S76012D Strain of muscle, fascia and tendon of left hip, subsequent encounter: Secondary | ICD-10-CM | POA: Diagnosis not present

## 2018-08-02 DIAGNOSIS — I10 Essential (primary) hypertension: Secondary | ICD-10-CM

## 2018-08-02 MED ORDER — METHYLPHENIDATE HCL ER (OSM) 18 MG PO TBCR: 18.0000 mg | EXTENDED_RELEASE_TABLET | Freq: Every day | ORAL | 0 refills | Status: DC

## 2018-08-02 NOTE — Progress Notes (Signed)
[facility] [place], [place] [address]  Internal MEDICINE  Office Visit Note  Patient Name: [name]  [id number]  [id number]  Date of Service: 08/02/2018  Chief Complaint  Patient presents with  . Hypertension    discuss medications , discuss ultrasound results   . Hyperlipidemia    HPI Pt here for follow up on HTN, and HLD. She reports she is doing well at this time.  Her BP is slightly elevated on exam.  However, she has been at work this morning. She reports being stressed at this time. She denies chest pain, palpitations, or headaches. She does reports she is out of her "concentration medicine" she is unable to recall the name, and It is not listed in her med list.  She would like to try concerto to help her with concentration at this time.       Current Medication: Outpatient Encounter Medications as of 08/02/2018  Medication Sig  . amLODipine (NORVASC) 5 MG tablet Take 1 tablet (5 mg total) by mouth daily.  Marland Kitchen atorvastatin (LIPITOR) 20 MG tablet TAKE 1 TABLET EVERY DAY USUALLY IN THE EVENING  . esomeprazole (NEXIUM) 40 MG capsule   . fexofenadine-pseudoephedrine (ALLEGRA-D ALLERGY & CONGESTION) 180-240 MG 24 hr tablet Take 1 tablet by mouth daily.  . fluticasone (FLONASE) 50 MCG/ACT nasal spray USE 2 SPRAYS IN EACH NOSTRIL EVERY DAY  . rOPINIRole (REQUIP) 2 MG tablet Take 1 tablet po BID prn   No facility-administered encounter medications on file as of 08/02/2018.     Surgical History: Past Surgical History:  Procedure Laterality Date  . COLONOSCOPY WITH PROPOFOL N/A 06/18/2017   Procedure: COLONOSCOPY WITH PROPOFOL;  Surgeon: Lucilla Lame, MD;  Location: [place];  Service: Endoscopy;  Laterality: N/A;  . ESOPHAGOGASTRODUODENOSCOPY (EGD) WITH PROPOFOL N/A 06/18/2017   Procedure: ESOPHAGOGASTRODUODENOSCOPY (EGD) WITH PROPOFOL;  Surgeon: Lucilla Lame, MD;  Location: [place];  Service: Endoscopy;  Laterality: N/A;  .  ESOPHAGOGASTRODUODENOSCOPY (EGD) WITH PROPOFOL N/A 10/05/2017   Procedure: ESOPHAGOGASTRODUODENOSCOPY (EGD) WITH PROPOFOL;  Surgeon: Lucilla Lame, MD;  Location: [place];  Service: Endoscopy;  Laterality: N/A;  . POLYPECTOMY  06/18/2017   Procedure: POLYPECTOMY;  Surgeon: Lucilla Lame, MD;  Location: [place];  Service: Endoscopy;;  . TUBAL LIGATION  1980    Medical History: Past Medical History:  Diagnosis Date  . Asthma   . H/O exercise stress test    at 37 years age  . Heart murmur   . Hyperlipidemia   . Hypertension   . Jaundice    In High school  . Jaundice    hx     Family History: Family History  Problem Relation Age of Onset  . Heart attack Mother   . Heart disease Mother 61  . Heart attack Father   . Heart disease Father 45  . Diabetes Sister   . Heart disease Brother   . Heart disease Brother   . Diabetes Maternal Grandmother   . Breast cancer Other   . Colon cancer Neg Hx     Social History   Socioeconomic History  . Marital status: Married    Spouse name: Not on file  . Number of children: 3  . Years of education: Not on file  . Highest education level: Not on file  Occupational History  . Occupation: Self Employed - Hairdresser  Social Needs  . Financial resource strain: Not on file  . Food insecurity:  Worry: Not on file    Inability: Not on file  . Transportation needs:    Medical: Not on file    Non-medical: Not on file  Tobacco Use  . Smoking status: Never Smoker  . Smokeless tobacco: Never Used  Substance and Sexual Activity  . Alcohol use: Yes    Alcohol/week: 7.0 standard drinks    Types: 7 Glasses of wine per week    Comment: Wine nightly  . Drug use: No  . Sexual activity: Not on file  Lifestyle  . Physical activity:    Days per week: Not on file    Minutes per session: Not on file  . Stress: Not on file  Relationships  . Social connections:    Talks on phone: Not on file    Gets together: Not on  file    Attends religious service: Not on file    Active member of club or organization: Not on file    Attends meetings of clubs or organizations: Not on file    Relationship status: Not on file  . Intimate partner violence:    Fear of current or ex partner: Not on file    Emotionally abused: Not on file    Physically abused: Not on file    Forced sexual activity: Not on file  Other Topics Concern  . Not on file  Social History Narrative   Lives in [place]. Works at [employer] in [employer]      Daily Caffeine Use:  2 coffee   Regular Exercise -  NO               Review of Systems  Constitutional: Negative for chills, fatigue and unexpected weight change.  HENT: Negative for congestion, rhinorrhea, sneezing and sore throat.   Eyes: Negative for photophobia, pain and redness.  Respiratory: Negative for cough, chest tightness and shortness of breath.   Cardiovascular: Negative for chest pain and palpitations.  Gastrointestinal: Negative for abdominal pain, constipation, diarrhea, nausea and vomiting.  Endocrine: Negative.   Genitourinary: Negative for dysuria and frequency.  Musculoskeletal: Negative for arthralgias, back pain, joint swelling and neck pain.  Skin: Negative for rash.  Allergic/Immunologic: Negative.   Neurological: Negative for tremors and numbness.  Hematological: Negative for adenopathy. Does not bruise/bleed easily.  Psychiatric/Behavioral: Negative for behavioral problems and sleep disturbance. The patient is not nervous/anxious.     Vital Signs: BP (!) 162/89   Pulse 66   Resp 16   Ht 4\' 11"  (1.499 m)   Wt 136 lb (61.7 kg)   SpO2 95%   BMI 27.47 kg/m    Physical Exam  Constitutional: She is oriented to person, place, and time. She appears well-developed and well-nourished. No distress.  HENT:  Head: Normocephalic and atraumatic.  Mouth/Throat: Oropharynx is clear and moist. No oropharyngeal exudate.  Eyes: Pupils are equal, round, and reactive  to light. EOM are normal.  Neck: Normal range of motion. Neck supple. No JVD present. No tracheal deviation present. No thyromegaly present.  Cardiovascular: Normal rate, regular rhythm and normal heart sounds. Exam reveals no gallop and no friction rub.  No murmur heard. Pulmonary/Chest: Effort normal and breath sounds normal. No respiratory distress. She has no wheezes. She has no rales. She exhibits no tenderness.  Abdominal: Soft. There is no tenderness. There is no guarding.  Musculoskeletal: Normal range of motion.  Lymphadenopathy:    She has no cervical adenopathy.  Neurological: She is alert and oriented to person, place, and  time. No cranial nerve deficit.  Skin: Skin is warm and dry. She is not diaphoretic.  Psychiatric: She has a normal mood and affect. Her behavior is normal. Judgment and thought content normal.  Nursing note and vitals reviewed.   Assessment/Plan: 1. Concentration deficit Try concerta for concentration  Will follow up in 30 days.  - methylphenidate (CONCERTA) 18 MG PO CR tablet; Take 1 tablet (18 mg total) by mouth daily.  Dispense: 30 tablet; Refill: 0  2. Essential hypertension Repeat BP 130/76.  Controlled at this time. Continue current therapy.  3. Mixed hyperlipidemia Stable, continue Lipitor as directed.   4. Gastroesophageal reflux disease without esophagitis On Nexium, denies issues currently.  Continue current therapy.   5. Flu vaccine need - Flu Vaccine MDCK QUAD PF  General Counseling: [name] verbalizes understanding of the findings of todays visit and agrees with plan of treatment. I have discussed any further diagnostic evaluation that may be needed or ordered today. We also reviewed her medications today. she has been encouraged to call the office with any questions or concerns that should arise related to todays visit.    Orders Placed This Encounter  Procedures  . Flu Vaccine MDCK QUAD PF    No orders of the defined types were  placed in this encounter.   Time spent: 25 Minutes   This patient was seen by Orson Gear AGNP-C in Collaboration with Dr Lavera Guise as a part of collaborative care agreement    Orson Gear [facility] Internal medicine

## 2018-08-02 NOTE — Patient Instructions (Signed)

## 2018-08-19 ENCOUNTER — Ambulatory Visit
Admission: RE | Admit: 2018-08-19 | Discharge: 2018-08-19 | Disposition: A | Discharge status: Home / Self Care | Payer: Medicare PPO | Source: Ambulatory Visit | Attending: Student | Admitting: Student

## 2018-08-19 DIAGNOSIS — M1612 Unilateral primary osteoarthritis, left hip: Secondary | ICD-10-CM | POA: Diagnosis not present

## 2018-08-19 DIAGNOSIS — S76312A Strain of muscle, fascia and tendon of the posterior muscle group at thigh level, left thigh, initial encounter: Secondary | ICD-10-CM | POA: Diagnosis not present

## 2018-08-19 DIAGNOSIS — M7062 Trochanteric bursitis, left hip: Secondary | ICD-10-CM | POA: Diagnosis not present

## 2018-08-19 DIAGNOSIS — S76012D Strain of muscle, fascia and tendon of left hip, subsequent encounter: Secondary | ICD-10-CM

## 2018-08-29 DIAGNOSIS — M25452 Effusion, left hip: Secondary | ICD-10-CM | POA: Diagnosis not present

## 2018-08-29 DIAGNOSIS — M1612 Unilateral primary osteoarthritis, left hip: Secondary | ICD-10-CM | POA: Diagnosis not present

## 2018-08-29 DIAGNOSIS — M25552 Pain in left hip: Secondary | ICD-10-CM | POA: Diagnosis not present

## 2018-08-29 DIAGNOSIS — S76012D Strain of muscle, fascia and tendon of left hip, subsequent encounter: Secondary | ICD-10-CM | POA: Diagnosis not present

## 2018-08-30 ENCOUNTER — Ambulatory Visit: Payer: Self-pay | Admitting: Adult Health

## 2018-09-02 ENCOUNTER — Ambulatory Visit: Payer: Self-pay | Admitting: Adult Health

## 2018-09-03 ENCOUNTER — Telehealth: Payer: Self-pay

## 2018-09-04 ENCOUNTER — Telehealth: Payer: Self-pay

## 2018-09-04 NOTE — Telephone Encounter (Signed)
Spoke with pt and informed her per DFK that her results for echocardiogram will be discussed at her next visit. 09/09/18

## 2018-09-05 NOTE — Telephone Encounter (Signed)
done

## 2018-09-09 ENCOUNTER — Encounter: Payer: Self-pay | Admitting: Adult Health

## 2018-09-09 ENCOUNTER — Ambulatory Visit: Payer: Medicare PPO | Admitting: Adult Health

## 2018-09-09 VITALS — BP 118/76 | HR 75 | Resp 16 | Ht 59.0 in | Wt 137.0 lb

## 2018-09-09 DIAGNOSIS — K219 Gastro-esophageal reflux disease without esophagitis: Secondary | ICD-10-CM

## 2018-09-09 DIAGNOSIS — E782 Mixed hyperlipidemia: Secondary | ICD-10-CM

## 2018-09-09 DIAGNOSIS — I10 Essential (primary) hypertension: Secondary | ICD-10-CM

## 2018-09-09 DIAGNOSIS — G2581 Restless legs syndrome: Secondary | ICD-10-CM

## 2018-09-09 NOTE — Progress Notes (Signed)
[facility] [place], [place] [address]  Internal MEDICINE  Office Visit Note  Patient Name: [name]  [id number]  [id number]  Date of Service: 09/09/2018  Chief Complaint  Patient presents with  . Hyperlipidemia    echo results  . Hypertension    HPI  Pt here for follow up on HLD and HTN.  Patient's blood pressure is well controlled at this time on amlodipine.  She currently takes atorvastatin for her hyperlipidemia.  She is here to discuss the results of her echocardiogram from a few months ago.  Her echo shows normal left ventricular ejection fraction.  It reports moderate left ventricular hypertrophy with Septal hypertrophy. It also shows diastolic dysfunction mild MR, TR and AR.  It also shows some mild pulmonary artery hypertension.  We discussed these results at this visit.  Current Medication: Outpatient Encounter Medications as of 09/09/2018  Medication Sig  . amLODipine (NORVASC) 5 MG tablet Take 1 tablet (5 mg total) by mouth daily.  Marland Kitchen atorvastatin (LIPITOR) 20 MG tablet TAKE 1 TABLET EVERY DAY USUALLY IN THE EVENING  . esomeprazole (NEXIUM) 40 MG capsule   . fexofenadine-pseudoephedrine (ALLEGRA-D ALLERGY & CONGESTION) 180-240 MG 24 hr tablet Take 1 tablet by mouth daily.  . fluticasone (FLONASE) 50 MCG/ACT nasal spray USE 2 SPRAYS IN EACH NOSTRIL EVERY DAY  . methylphenidate (CONCERTA) 18 MG PO CR tablet Take 1 tablet (18 mg total) by mouth daily.  Marland Kitchen rOPINIRole (REQUIP) 2 MG tablet Take 1 tablet po BID prn   No facility-administered encounter medications on file as of 09/09/2018.     Surgical History: Past Surgical History:  Procedure Laterality Date  . COLONOSCOPY WITH PROPOFOL N/A 06/18/2017   Procedure: COLONOSCOPY WITH PROPOFOL;  Surgeon: Lucilla Lame, MD;  Location: [place];  Service: Endoscopy;  Laterality: N/A;  . ESOPHAGOGASTRODUODENOSCOPY (EGD) WITH PROPOFOL N/A 06/18/2017   Procedure:  ESOPHAGOGASTRODUODENOSCOPY (EGD) WITH PROPOFOL;  Surgeon: Lucilla Lame, MD;  Location: [place];  Service: Endoscopy;  Laterality: N/A;  . ESOPHAGOGASTRODUODENOSCOPY (EGD) WITH PROPOFOL N/A 10/05/2017   Procedure: ESOPHAGOGASTRODUODENOSCOPY (EGD) WITH PROPOFOL;  Surgeon: Lucilla Lame, MD;  Location: [place];  Service: Endoscopy;  Laterality: N/A;  . POLYPECTOMY  06/18/2017   Procedure: POLYPECTOMY;  Surgeon: Lucilla Lame, MD;  Location: [place];  Service: Endoscopy;;  . TUBAL LIGATION  1980    Medical History: Past Medical History:  Diagnosis Date  . Asthma   . H/O exercise stress test    at 74 years age  . Heart murmur   . Hyperlipidemia   . Hypertension   . Jaundice    In High school  . Jaundice    hx     Family History: Family History  Problem Relation Age of Onset  . Heart attack Mother   . Heart disease Mother 83  . Heart attack Father   . Heart disease Father 35  . Diabetes Sister   . Heart disease Brother   . Heart disease Brother   . Diabetes Maternal Grandmother   . Breast cancer Other   . Colon cancer Neg Hx     Social History   Socioeconomic History  . Marital status: Married    Spouse name: Not on file  . Number of children: 3  . Years of education: Not on file  . Highest education level: Not on file  Occupational History  . Occupation: Self Employed - Hairdresser  Social Needs  . Financial resource strain:  Not on file  . Food insecurity:    Worry: Not on file    Inability: Not on file  . Transportation needs:    Medical: Not on file    Non-medical: Not on file  Tobacco Use  . Smoking status: Never Smoker  . Smokeless tobacco: Never Used  Substance and Sexual Activity  . Alcohol use: Yes    Alcohol/week: 7.0 standard drinks    Types: 7 Glasses of wine per week    Comment: Wine nightly  . Drug use: No  . Sexual activity: Not on file  Lifestyle  . Physical activity:    Days per week: Not on file    Minutes  per session: Not on file  . Stress: Not on file  Relationships  . Social connections:    Talks on phone: Not on file    Gets together: Not on file    Attends religious service: Not on file    Active member of club or organization: Not on file    Attends meetings of clubs or organizations: Not on file    Relationship status: Not on file  . Intimate partner violence:    Fear of current or ex partner: Not on file    Emotionally abused: Not on file    Physically abused: Not on file    Forced sexual activity: Not on file  Other Topics Concern  . Not on file  Social History Narrative   Lives in [place]. Works at [employer] in [employer]      Daily Caffeine Use:  2 coffee   Regular Exercise -  NO               Review of Systems  Constitutional: Negative for chills, fatigue and unexpected weight change.  HENT: Negative for congestion, rhinorrhea, sneezing and sore throat.   Eyes: Negative for photophobia, pain and redness.  Respiratory: Negative for cough, chest tightness and shortness of breath.   Cardiovascular: Negative for chest pain and palpitations.  Gastrointestinal: Negative for abdominal pain, constipation, diarrhea, nausea and vomiting.  Endocrine: Negative.   Genitourinary: Negative for dysuria and frequency.  Musculoskeletal: Negative for arthralgias, back pain, joint swelling and neck pain.  Skin: Negative for rash.  Allergic/Immunologic: Negative.   Neurological: Negative for tremors and numbness.  Hematological: Negative for adenopathy. Does not bruise/bleed easily.  Psychiatric/Behavioral: Negative for behavioral problems and sleep disturbance. The patient is not nervous/anxious.     Vital Signs: BP 118/76 (BP Location: Left Arm, Patient Position: Sitting, Cuff Size: Normal)   Pulse 75   Resp 16   Ht 4\' 11"  (1.499 m)   Wt 137 lb (62.1 kg)   SpO2 98%   BMI 27.67 kg/m    Physical Exam  Constitutional: She is oriented to person, place, and time. She appears  well-developed and well-nourished. No distress.  HENT:  Head: Normocephalic and atraumatic.  Mouth/Throat: Oropharynx is clear and moist. No oropharyngeal exudate.  Eyes: Pupils are equal, round, and reactive to light. EOM are normal.  Neck: Normal range of motion. Neck supple. No JVD present. No tracheal deviation present. No thyromegaly present.  Cardiovascular: Normal rate, regular rhythm and normal heart sounds. Exam reveals no gallop and no friction rub.  No murmur heard. Pulmonary/Chest: Effort normal and breath sounds normal. No respiratory distress. She has no wheezes. She has no rales. She exhibits no tenderness.  Abdominal: Soft. There is no tenderness. There is no guarding.  Musculoskeletal: Normal range of motion.  Lymphadenopathy:  She has no cervical adenopathy.  Neurological: She is alert and oriented to person, place, and time. No cranial nerve deficit.  Skin: Skin is warm and dry. She is not diaphoretic.  Psychiatric: She has a normal mood and affect. Her behavior is normal. Judgment and thought content normal.  Nursing note and vitals reviewed.   Assessment/Plan: 1. Essential hypertension Due to patient's echo results as well as history of hypertension hyperlipidemia cardiology referral is placed so that patient can establish care and be evaluated as they see fit. - Ambulatory referral to Cardiology  2. Mixed hyperlipidemia Continue taking atorvastatin.  Recheck lipid panel at next physical.  3. Gastroesophageal reflux disease without esophagitis Patient continues to report some minor GERD symptoms despite using Nexium daily.  Encourage patient to keep food diary and to eliminate foods that calls GERD.  4. Restless leg syndrome Continue using Requip.  Patient denies any symptoms.  General Counseling: madelene kaatz understanding of the findings of todays visit and agrees with plan of treatment. I have discussed any further diagnostic evaluation that may be  needed or ordered today. We also reviewed her medications today. she has been encouraged to call the office with any questions or concerns that should arise related to todays visit.    Orders Placed This Encounter  Procedures  . Ambulatory referral to Cardiology    No orders of the defined types were placed in this encounter.   Time spent: 25 Minutes   This patient was seen by Orson Gear AGNP-C in Collaboration with Dr Lavera Guise as a part of collaborative care agreement     Kendell Bane AGNP-C Internal medicine

## 2018-09-09 NOTE — Patient Instructions (Signed)

## 2018-09-16 ENCOUNTER — Other Ambulatory Visit: Payer: Self-pay

## 2018-09-16 DIAGNOSIS — G2581 Restless legs syndrome: Secondary | ICD-10-CM

## 2018-09-16 MED ORDER — ROPINIROLE HCL 2 MG PO TABS: ORAL_TABLET | ORAL | 3 refills | Status: DC

## 2018-09-23 DIAGNOSIS — M7062 Trochanteric bursitis, left hip: Secondary | ICD-10-CM | POA: Diagnosis not present

## 2018-09-23 DIAGNOSIS — M25552 Pain in left hip: Secondary | ICD-10-CM | POA: Diagnosis not present

## 2018-09-23 DIAGNOSIS — S76012D Strain of muscle, fascia and tendon of left hip, subsequent encounter: Secondary | ICD-10-CM | POA: Diagnosis not present

## 2018-09-23 DIAGNOSIS — M67952 Unspecified disorder of synovium and tendon, left thigh: Secondary | ICD-10-CM | POA: Diagnosis not present

## 2018-09-23 DIAGNOSIS — M25452 Effusion, left hip: Secondary | ICD-10-CM | POA: Diagnosis not present

## 2018-09-23 DIAGNOSIS — M1612 Unilateral primary osteoarthritis, left hip: Secondary | ICD-10-CM | POA: Diagnosis not present

## 2018-11-10 NOTE — Progress Notes (Signed)
Cardiology Office Note  Date:  11/11/2018   ID:  Kimberly, Schaefer 09/15/1946, MRN [id number]  PCP:  Kimberly Guise, MD   Chief Complaint  Patient presents with  . Other    referred by PCP for SOB and Hypertension. Meds reviewed verbally with patient.     HPI:  [name] is a 73 year old woman with past medical history of HTN GERD Referred by [name] for HTN, abnormal echocardiogram  On today's visit she reports that she is not taking amlodipine or Lipitor She has not taken these in quite some time On lab work drawn June 2019 she was not on the Lipitor LDL 94  She does not monitor blood pressure at home She was seen by primary care September 09, 1024 systolic pressure at that time 118/76.  Again unclear if she was taking her medication/amlodipine  Echocardiogram done through PMD No images available for review Report details normal left ventricular ejection fraction.    moderate left ventricular hypertrophy with Septal hypertrophy.   diastolic dysfunction mild MR, TR and AR.  mild pulmonary artery hypertension.  Notes also detail 7 Glasses of wine per week  She reports that she was started on a statin in the past based off carotid ultrasound showing mild buildup, results not available  Denies any significant shortness of breath or chest discomfort on exertion  EKG personally reviewed by myself on todays visit Shows normal sinus rhythm with rate 68 bpm left axis deviation no significant ST or T wave changes   PMH:   has a past medical history of Asthma, H/O exercise stress test, Heart murmur, Hyperlipidemia, Hypertension, Jaundice, and Jaundice.  PSH:    Past Surgical History:  Procedure Laterality Date  . COLONOSCOPY WITH PROPOFOL N/A 06/18/2017   Procedure: COLONOSCOPY WITH PROPOFOL;  Surgeon: Lucilla Lame, MD;  Location: [place];  Service: Endoscopy;  Laterality: N/A;  . ESOPHAGOGASTRODUODENOSCOPY (EGD) WITH PROPOFOL N/A 06/18/2017   Procedure: ESOPHAGOGASTRODUODENOSCOPY (EGD) WITH PROPOFOL;  Surgeon: Lucilla Lame, MD;  Location: [place];  Service: Endoscopy;  Laterality: N/A;  . ESOPHAGOGASTRODUODENOSCOPY (EGD) WITH PROPOFOL N/A 10/05/2017   Procedure: ESOPHAGOGASTRODUODENOSCOPY (EGD) WITH PROPOFOL;  Surgeon: Lucilla Lame, MD;  Location: [place];  Service: Endoscopy;  Laterality: N/A;  . POLYPECTOMY  06/18/2017   Procedure: POLYPECTOMY;  Surgeon: Lucilla Lame, MD;  Location: [place];  Service: Endoscopy;;  . TUBAL LIGATION  1980    Current Outpatient Medications  Medication Sig Dispense Refill  . amLODipine (NORVASC) 5 MG tablet Take 1 tablet (5 mg total) by mouth daily. 90 tablet 2  . atorvastatin (LIPITOR) 20 MG tablet TAKE 1 TABLET EVERY DAY USUALLY IN THE EVENING 90 tablet 1  . esomeprazole (NEXIUM) 40 MG capsule     . fexofenadine-pseudoephedrine (ALLEGRA-D ALLERGY & CONGESTION) 180-240 MG 24 hr tablet Take 1 tablet by mouth daily. 30 tablet 5  . fluticasone (FLONASE) 50 MCG/ACT nasal spray USE 2 SPRAYS IN EACH NOSTRIL EVERY DAY 48 g 5  . methylphenidate (CONCERTA) 18 MG PO CR tablet Take 1 tablet (18 mg total) by mouth daily. 30 tablet 0  . rOPINIRole (REQUIP) 2 MG tablet Take 1 tablet po BID prn 60 tablet 3   No current facility-administered medications for this visit.      Allergies:   Codeine and Penicillins   Social History:  The patient  reports that she has never smoked. She has never used smokeless tobacco. She reports current alcohol use of about 7.0  standard drinks of alcohol per week. She reports that she does not use drugs.   Family History:   family history includes Breast cancer in an other family member; Diabetes in her maternal grandmother and sister; Heart attack in her father and mother; Heart disease in her brother and brother; Heart disease (age of onset: 50) in her mother; Heart disease (age of onset: 22) in her father.    Review of Systems: Review of  Systems  Constitutional: Negative.   Respiratory: Negative.   Cardiovascular: Negative.   Gastrointestinal: Negative.   Musculoskeletal: Negative.   Neurological: Negative.   Psychiatric/Behavioral: Negative.   All other systems reviewed and are negative.   PHYSICAL EXAM: VS:  BP 140/81 (BP Location: Right Arm, Patient Position: Sitting, Cuff Size: Normal)   Pulse 68   Ht 4\' 11"  (1.499 m)   Wt 139 lb (63 kg)   BMI 28.07 kg/m  , BMI Body mass index is 28.07 kg/m. GEN: Well nourished, well developed, in no acute distress  HEENT: normal  Neck: no JVD, carotid bruits, or masses Cardiac: RRR; no murmurs, rubs, or gallops,no edema  Respiratory:  clear to auscultation bilaterally, normal work of breathing GI: soft, nontender, nondistended, + BS MS: no deformity or atrophy  Skin: warm and dry, no rash Neuro:  Strength and sensation are intact Psych: euthymic mood, full affect   Recent Labs: 04/08/2018: ALT 54; BUN 15; Creatinine, Ser 0.46; Hemoglobin 14.2; Platelets 292; Potassium 4.5; Sodium 142; TSH 2.470    Lipid Panel Lab Results  Component Value Date   CHOL 188 04/08/2018   HDL 82 04/08/2018   LDLCALC 94 04/08/2018   TRIG 60 04/08/2018      Wt Readings from Last 3 Encounters:  11/11/18 139 lb (63 kg)  09/09/18 137 lb (62.1 kg)  08/02/18 136 lb (61.7 kg)       ASSESSMENT AND PLAN:  Essential hypertension - Plan: EKG 12-Lead, CT CARDIAC SCORING Reports she has not been taking amlodipine for quite some time if at all Does not like taking medications Suggested she monitor blood pressure at home and call our office with some numbers Discussed goal systolic pressure, new guidelines less than 130, previous guidelines less than 629 systolic If blood pressure does run high we would restart amlodipine  Family history of early CAD - Plan: CT CARDIAC SCORING Reports that she was previously started on Lipitor after a carotid ultrasound.  Unable to find carotid  ultrasound details LDL 94 not on any medication per the patient Discussed other risk stratification studies available, At her request we have ordered CT coronary calcium score to be done in [place] We will try to obtain prior carotid ultrasound results  Abnormal echocardiogram Normal LV function with LVH Measurements of wall thickness did indicate moderate LVH but images not available for review EKG does not support LVH given there is no significant voltage No murmur on exam Despite that we have recommended aggressive blood pressure control Discussed relationship between hypertrophy and poorly controlled blood pressure  Disposition:   We will call her with the results of her CT coronary calcium score She will call our office with blood pressure measurements   Total encounter time more than 45 minutes  Greater than 50% was spent in counseling and coordination of care with the patient    Orders Placed This Encounter  Procedures  . CT CARDIAC SCORING  . EKG 12-Lead     Signed, Esmond Plants, M.D., Ph.D. 11/11/2018  [place]  [place], [place]

## 2018-11-11 ENCOUNTER — Ambulatory Visit: Payer: Medicare PPO | Admitting: Cardiovascular Disease

## 2018-11-11 ENCOUNTER — Encounter: Payer: Self-pay | Admitting: Cardiovascular Disease

## 2018-11-11 VITALS — BP 140/81 | HR 68 | Ht 59.0 in | Wt 139.0 lb

## 2018-11-11 DIAGNOSIS — R931 Abnormal findings on diagnostic imaging of heart and coronary circulation: Secondary | ICD-10-CM | POA: Diagnosis not present

## 2018-11-11 DIAGNOSIS — I10 Essential (primary) hypertension: Secondary | ICD-10-CM | POA: Diagnosis not present

## 2018-11-11 DIAGNOSIS — M25552 Pain in left hip: Secondary | ICD-10-CM | POA: Diagnosis not present

## 2018-11-11 DIAGNOSIS — M1612 Unilateral primary osteoarthritis, left hip: Secondary | ICD-10-CM | POA: Diagnosis not present

## 2018-11-11 DIAGNOSIS — S76012D Strain of muscle, fascia and tendon of left hip, subsequent encounter: Secondary | ICD-10-CM | POA: Diagnosis not present

## 2018-11-11 DIAGNOSIS — Z8249 Family history of ischemic heart disease and other diseases of the circulatory system: Secondary | ICD-10-CM | POA: Diagnosis not present

## 2018-11-11 DIAGNOSIS — R29898 Other symptoms and signs involving the musculoskeletal system: Secondary | ICD-10-CM | POA: Diagnosis not present

## 2018-11-11 NOTE — Patient Instructions (Addendum)
Goal systolic blood pressure (top number) <140  (preferably <130)  Please monitor at home    Medication Instructions:  No changes  If you need a refill on your cardiac medications before your next appointment, please call your pharmacy.    Lab work: No new labs needed   If you have labs (blood work) drawn today and your tests are completely normal, you will receive your results only by: Marland Kitchen MyChart Message (if you have MyChart) OR . A paper copy in the mail If you have any lab test that is abnormal or we need to change your treatment, we will call you to review the results.   Testing/Procedures: We will schedule a CT coronary calcium score  Please call [phone] to schedule for your convenience. Cost is $150.   Follow-Up: At [facility], you and your health needs are our priority.  As part of our continuing mission to provide you with exceptional heart care, we have created designated Provider Care Teams.  These Care Teams include your primary Cardiologist (physician) and Advanced Practice Providers (APPs -  Physician Assistants and Nurse Practitioners) who all work together to provide you with the care you need, when you need it.  . You will need a follow up appointment in 12 months .   Please call our office 2 months in advance to schedule this appointment.    . Providers on your designated Care Team:   . Murray Hodgkins, NP . Christell Faith, PA-C . Marrianne Mood, PA-C  Any Other Special Instructions Will Be Listed Below (If Applicable).  For educational health videos Log in to : [email or web address] Or : [email or web address], password : [email or web address]  Home

## 2018-11-15 DIAGNOSIS — S76012A Strain of muscle, fascia and tendon of left hip, initial encounter: Secondary | ICD-10-CM | POA: Diagnosis not present

## 2018-11-15 DIAGNOSIS — M7582 Other shoulder lesions, left shoulder: Secondary | ICD-10-CM | POA: Diagnosis not present

## 2018-11-25 ENCOUNTER — Ambulatory Visit (INDEPENDENT_AMBULATORY_CARE_PROVIDER_SITE_OTHER)
Admission: RE | Admit: 2018-11-25 | Discharge: 2018-11-25 | Disposition: A | Discharge status: Home / Self Care | Payer: Self-pay | Source: Ambulatory Visit | Attending: Cardiovascular Disease | Admitting: Cardiovascular Disease

## 2018-11-25 DIAGNOSIS — I10 Essential (primary) hypertension: Secondary | ICD-10-CM

## 2018-11-25 DIAGNOSIS — Z8249 Family history of ischemic heart disease and other diseases of the circulatory system: Secondary | ICD-10-CM

## 2018-12-02 ENCOUNTER — Other Ambulatory Visit: Payer: Self-pay

## 2018-12-02 ENCOUNTER — Other Ambulatory Visit: Payer: Self-pay | Admitting: *Deleted

## 2018-12-02 ENCOUNTER — Telehealth: Payer: Self-pay | Admitting: Cardiovascular Disease

## 2018-12-02 DIAGNOSIS — M25552 Pain in left hip: Secondary | ICD-10-CM | POA: Diagnosis not present

## 2018-12-02 MED ORDER — ATORVASTATIN CALCIUM 20 MG PO TABS: ORAL_TABLET | ORAL | 2 refills | Status: DC

## 2018-12-02 NOTE — Telephone Encounter (Signed)
Patient stated that she was already called and nothing further was needed at this time.

## 2018-12-02 NOTE — Telephone Encounter (Signed)
Patient calling to discuss recent testing  CT CALCIUM SCORE results   Please call

## 2018-12-14 ENCOUNTER — Other Ambulatory Visit: Payer: Self-pay | Admitting: Adult Health

## 2018-12-14 DIAGNOSIS — G2581 Restless legs syndrome: Secondary | ICD-10-CM

## 2018-12-23 DIAGNOSIS — H2511 Age-related nuclear cataract, right eye: Secondary | ICD-10-CM | POA: Diagnosis not present

## 2019-01-24 DIAGNOSIS — S76012D Strain of muscle, fascia and tendon of left hip, subsequent encounter: Secondary | ICD-10-CM | POA: Diagnosis not present

## 2019-01-24 DIAGNOSIS — M7582 Other shoulder lesions, left shoulder: Secondary | ICD-10-CM | POA: Diagnosis not present

## 2019-02-18 ENCOUNTER — Other Ambulatory Visit: Payer: Self-pay

## 2019-02-18 ENCOUNTER — Encounter: Payer: Self-pay | Admitting: Internal Medicine

## 2019-02-18 ENCOUNTER — Ambulatory Visit: Payer: Medicare PPO | Admitting: Internal Medicine

## 2019-02-18 VITALS — BP 140/78 | Temp 96.0°F | Resp 16 | Ht 59.0 in | Wt 135.6 lb

## 2019-02-18 DIAGNOSIS — R635 Abnormal weight gain: Secondary | ICD-10-CM

## 2019-02-18 DIAGNOSIS — N814 Uterovaginal prolapse, unspecified: Secondary | ICD-10-CM | POA: Diagnosis not present

## 2019-02-18 DIAGNOSIS — Z1231 Encounter for screening mammogram for malignant neoplasm of breast: Secondary | ICD-10-CM | POA: Diagnosis not present

## 2019-02-18 DIAGNOSIS — R300 Dysuria: Secondary | ICD-10-CM

## 2019-02-18 DIAGNOSIS — Z124 Encounter for screening for malignant neoplasm of cervix: Secondary | ICD-10-CM | POA: Diagnosis not present

## 2019-02-18 LAB — POCT URINALYSIS DIPSTICK
Bilirubin, UA: NEGATIVE
Blood, UA: NEGATIVE
Glucose, UA: NEGATIVE
Ketones, UA: NEGATIVE
Leukocytes, UA: NEGATIVE
Nitrite, UA: NEGATIVE
Protein, UA: NEGATIVE
Spec Grav, UA: 1.01 (ref 1.010–1.025)
Urobilinogen, UA: 0.2 E.U./dL
pH, UA: 6 (ref 5.0–8.0)

## 2019-02-18 NOTE — Progress Notes (Signed)
[facility] [place], [place] [address]  Internal MEDICINE  Office Visit Note  Patient Name: [name]  [id number]  [id number]  Date of Service: 02/18/2019  Chief Complaint  Patient presents with  . Vaginitis    swelling in vaginal  . Quality Metric Gaps    mammogram    HPI 1. Patient is c/o a mass in her vagina, sometimes it feels like it might come out and other times she does not feel it at all, she denies any itching, burning or discharge. 2. She is also c/o left hip pain, she does have tendon tear and will need surgery 3. Trying to lose weight, staying home    Current Medication: Outpatient Encounter Medications as of 02/18/2019  Medication Sig  . amLODipine (NORVASC) 5 MG tablet Take 1 tablet (5 mg total) by mouth daily.  Marland Kitchen atorvastatin (LIPITOR) 20 MG tablet TAKE 1 TABLET EVERY DAY USUALLY IN THE EVENING  . esomeprazole (NEXIUM) 40 MG capsule   . fexofenadine-pseudoephedrine (ALLEGRA-D ALLERGY & CONGESTION) 180-240 MG 24 hr tablet Take 1 tablet by mouth daily.  . fluticasone (FLONASE) 50 MCG/ACT nasal spray USE 2 SPRAYS IN EACH NOSTRIL EVERY DAY  . rOPINIRole (REQUIP) 2 MG tablet TAKE 1 TABLET BY MOUTH TWICE A DAY AS NEEDED  . methylphenidate (CONCERTA) 18 MG PO CR tablet Take 1 tablet (18 mg total) by mouth daily. (Patient not taking: Reported on 02/18/2019)   No facility-administered encounter medications on file as of 02/18/2019.     Surgical History: Past Surgical History:  Procedure Laterality Date  . COLONOSCOPY WITH PROPOFOL N/A 06/18/2017   Procedure: COLONOSCOPY WITH PROPOFOL;  Surgeon: Lucilla Lame, MD;  Location: [facility];  Service: Endoscopy;  Laterality: N/A;  . ESOPHAGOGASTRODUODENOSCOPY (EGD) WITH PROPOFOL N/A 06/18/2017   Procedure: ESOPHAGOGASTRODUODENOSCOPY (EGD) WITH PROPOFOL;  Surgeon: Lucilla Lame, MD;  Location: [place];  Service: Endoscopy;  Laterality: N/A;  . ESOPHAGOGASTRODUODENOSCOPY (EGD)  WITH PROPOFOL N/A 10/05/2017   Procedure: ESOPHAGOGASTRODUODENOSCOPY (EGD) WITH PROPOFOL;  Surgeon: Lucilla Lame, MD;  Location: [place];  Service: Endoscopy;  Laterality: N/A;  . POLYPECTOMY  06/18/2017   Procedure: POLYPECTOMY;  Surgeon: Lucilla Lame, MD;  Location: [place];  Service: Endoscopy;;  . TUBAL LIGATION  1980    Medical History: Past Medical History:  Diagnosis Date  . Asthma   . H/O exercise stress test    at 39 years age  . Heart murmur   . Hyperlipidemia   . Hypertension   . Jaundice    In High school  . Jaundice    hx     Family History: Family History  Problem Relation Age of Onset  . Heart attack Mother   . Heart disease Mother 79  . Heart attack Father   . Heart disease Father 5  . Diabetes Sister   . Heart disease Brother   . Heart disease Brother   . Diabetes Maternal Grandmother   . Breast cancer Other   . Colon cancer Neg Hx     Social History   Socioeconomic History  . Marital status: Married    Spouse name: Not on file  . Number of children: 3  . Years of education: Not on file  . Highest education level: Not on file  Occupational History  . Occupation: Self Employed - Hairdresser  Social Needs  . Financial resource strain: Not on file  . Food insecurity:    Worry: Not on file  Inability: Not on file  . Transportation needs:    Medical: Not on file    Non-medical: Not on file  Tobacco Use  . Smoking status: Never Smoker  . Smokeless tobacco: Never Used  Substance and Sexual Activity  . Alcohol use: Yes    Alcohol/week: 7.0 standard drinks    Types: 7 Glasses of wine per week    Comment: Wine nightly  . Drug use: No  . Sexual activity: Not on file  Lifestyle  . Physical activity:    Days per week: Not on file    Minutes per session: Not on file  . Stress: Not on file  Relationships  . Social connections:    Talks on phone: Not on file    Gets together: Not on file    Attends religious service:  Not on file    Active member of club or organization: Not on file    Attends meetings of clubs or organizations: Not on file    Relationship status: Not on file  . Intimate partner violence:    Fear of current or ex partner: Not on file    Emotionally abused: Not on file    Physically abused: Not on file    Forced sexual activity: Not on file  Other Topics Concern  . Not on file  Social History Narrative   Lives in [place]. Works at [employer] in [employer]      Daily Caffeine Use:  2 coffee   Regular Exercise -  NO               Review of Systems  Constitutional: Negative for chills, diaphoresis and fatigue.  HENT: Negative for ear pain, postnasal drip and sinus pressure.   Eyes: Negative for photophobia, discharge, redness, itching and visual disturbance.  Respiratory: Negative for cough, shortness of breath and wheezing.   Cardiovascular: Negative for chest pain, palpitations and leg swelling.  Gastrointestinal: Negative for abdominal pain, constipation, diarrhea, nausea and vomiting.  Genitourinary: Negative for difficulty urinating, dysuria, flank pain, hematuria and pelvic pain.       Vaginal mass   Musculoskeletal: Negative for arthralgias, back pain, gait problem and neck pain.  Skin: Negative for color change.  Allergic/Immunologic: Negative for environmental allergies and food allergies.  Neurological: Negative for dizziness and headaches.  Hematological: Does not bruise/bleed easily.  Psychiatric/Behavioral: Negative for agitation, behavioral problems (depression) and hallucinations.    Vital Signs: BP 140/78   Temp (!) 96 F (35.6 C)   Resp 16   Ht 4\' 11"  (1.499 m)   Wt 135 lb 9.6 oz (61.5 kg)   SpO2 98%   BMI 27.39 kg/m    Physical Exam Genitourinary:    General: Normal vulva.     Exam position: Lithotomy position.     Pubic Area: No rash.      Labia:        Right: No rash.        Left: No rash.      Vagina: Normal.     Comments: Cervix is  enlarged and boggy looking, vaginal walls are collapsed, mild  UV prolapse is seen. Pap is performed  Lymphadenopathy:     Lower Body: No right inguinal adenopathy. No left inguinal adenopathy.    Assessment/Plan: 1. Routine cervical smear - Pap IG and HPV (high risk) DNA detection  2. Uterovaginal prolapse - Will wait for pap smear results, will need to see urogyen  3. Encounter for mammogram to establish baseline  mammogram - MM DIGITAL SCREENING BILATERAL; Future  4. Dysuria - POCT urinalysis dipstick  5. Weight gain finding - Restricted calories of 1200 kcal, with 30 min of gentle/brisk walking advised   General Counseling: Genea verbalizes understanding of the findings of todays visit and agrees with plan of treatment. I have discussed any further diagnostic evaluation that may be needed or ordered today. We also reviewed her medications today. she has been encouraged to call the office with any questions or concerns that should arise related to todays visit.    Orders Placed This Encounter  Procedures  . MM DIGITAL SCREENING BILATERAL  . POCT urinalysis dipstick    No orders of the defined types were placed in this encounter.   Time spent:Minutes  Dr Lavera Guise Internal medicine

## 2019-02-18 NOTE — Addendum Note (Signed)
Addended by: Lenon Oms on: 02/18/2019 02:01 PM   Modules accepted: Orders

## 2019-02-22 LAB — PAP IG AND HPV HIGH-RISK

## 2019-02-22 LAB — HPV, LOW VOLUME (REFLEX): HPV low volume reflex: NEGATIVE

## 2019-02-24 ENCOUNTER — Telehealth: Payer: Self-pay

## 2019-02-24 NOTE — Telephone Encounter (Signed)
-----   Message from Lavera Guise, MD sent at 02/24/2019 10:58 AM EDT ----- Please let her know her pap smear is normal and if he continues to have symptoms we can refer her to urogynecology

## 2019-02-24 NOTE — Progress Notes (Signed)
Please let her know her pap smear is normal and if he continues to have symptoms we can refer her to urogynecology

## 2019-02-24 NOTE — Telephone Encounter (Signed)
Pt advised  Pap normal  And she still having symptoms so gave beth for referral for urogynecology for further evaluation

## 2019-04-28 DIAGNOSIS — N812 Incomplete uterovaginal prolapse: Secondary | ICD-10-CM | POA: Insufficient documentation

## 2019-05-23 DIAGNOSIS — M1612 Unilateral primary osteoarthritis, left hip: Secondary | ICD-10-CM | POA: Diagnosis not present

## 2019-05-23 DIAGNOSIS — M7582 Other shoulder lesions, left shoulder: Secondary | ICD-10-CM | POA: Diagnosis not present

## 2019-05-23 DIAGNOSIS — G8929 Other chronic pain: Secondary | ICD-10-CM | POA: Diagnosis not present

## 2019-05-23 DIAGNOSIS — M25512 Pain in left shoulder: Secondary | ICD-10-CM | POA: Diagnosis not present

## 2019-05-23 DIAGNOSIS — M160 Bilateral primary osteoarthritis of hip: Secondary | ICD-10-CM | POA: Diagnosis not present

## 2019-05-23 DIAGNOSIS — M47816 Spondylosis without myelopathy or radiculopathy, lumbar region: Secondary | ICD-10-CM | POA: Diagnosis not present

## 2019-05-23 DIAGNOSIS — M25552 Pain in left hip: Secondary | ICD-10-CM | POA: Diagnosis not present

## 2019-05-28 DIAGNOSIS — M1612 Unilateral primary osteoarthritis, left hip: Secondary | ICD-10-CM | POA: Insufficient documentation

## 2019-06-02 DIAGNOSIS — M25552 Pain in left hip: Secondary | ICD-10-CM | POA: Diagnosis not present

## 2019-06-16 DIAGNOSIS — M19012 Primary osteoarthritis, left shoulder: Secondary | ICD-10-CM | POA: Diagnosis not present

## 2019-06-27 ENCOUNTER — Ambulatory Visit: Payer: Self-pay | Admitting: Nurse Practitioner

## 2019-08-04 ENCOUNTER — Ambulatory Visit: Payer: Self-pay | Admitting: Nurse Practitioner

## 2019-08-05 ENCOUNTER — Other Ambulatory Visit: Payer: Self-pay | Admitting: Cardiovascular Disease

## 2019-08-23 ENCOUNTER — Other Ambulatory Visit: Payer: Self-pay | Admitting: Adult Health

## 2019-08-23 DIAGNOSIS — G2581 Restless legs syndrome: Secondary | ICD-10-CM

## 2019-09-17 DIAGNOSIS — M7601 Gluteal tendinitis, right hip: Secondary | ICD-10-CM | POA: Diagnosis not present

## 2019-09-17 DIAGNOSIS — G8929 Other chronic pain: Secondary | ICD-10-CM | POA: Diagnosis not present

## 2019-09-17 DIAGNOSIS — M25551 Pain in right hip: Secondary | ICD-10-CM | POA: Diagnosis not present

## 2019-10-08 ENCOUNTER — Telehealth: Payer: Self-pay

## 2019-10-08 NOTE — Telephone Encounter (Signed)
Patient rescheduled appointment on 10/10/2019 to 11/13/2018. klh

## 2019-10-10 ENCOUNTER — Ambulatory Visit: Payer: Self-pay | Admitting: Nurse Practitioner

## 2019-11-12 ENCOUNTER — Telehealth: Payer: Self-pay

## 2019-11-12 NOTE — Telephone Encounter (Signed)
No answer and no Voicemail unable to confirm as a virtual visit.

## 2019-11-14 ENCOUNTER — Encounter: Payer: Self-pay | Admitting: Nurse Practitioner

## 2019-11-14 ENCOUNTER — Ambulatory Visit (INDEPENDENT_AMBULATORY_CARE_PROVIDER_SITE_OTHER): Payer: Medicare HMO | Admitting: Nurse Practitioner

## 2019-11-14 ENCOUNTER — Other Ambulatory Visit: Payer: Self-pay

## 2019-11-14 VITALS — BP 155/90 | HR 72 | Resp 16 | Ht 59.0 in | Wt 142.4 lb

## 2019-11-14 DIAGNOSIS — R300 Dysuria: Secondary | ICD-10-CM

## 2019-11-14 DIAGNOSIS — R41840 Attention and concentration deficit: Secondary | ICD-10-CM

## 2019-11-14 DIAGNOSIS — R635 Abnormal weight gain: Secondary | ICD-10-CM | POA: Diagnosis not present

## 2019-11-14 DIAGNOSIS — Z0001 Encounter for general adult medical examination with abnormal findings: Secondary | ICD-10-CM

## 2019-11-14 DIAGNOSIS — I10 Essential (primary) hypertension: Secondary | ICD-10-CM | POA: Diagnosis not present

## 2019-11-14 NOTE — Progress Notes (Signed)
[facility] [place], [place] [address]  Internal MEDICINE  Office Visit Note  Patient Name: [name]  [id number]  [id number]  Date of Service: 11/16/2019   Pt is here for routine health maintenance examination  Chief Complaint  Patient presents with  . Medicare Wellness  . Hypertension  . Hyperlipidemia  . Medication Refill    would like concerta refilled   . Migraine    pt states she has silent migraines sometimes, not often      The patient is here for health maintenance exam. She does have complaint of prolapse uterus. It does cause her some discomfort at times. She has seen uro/gyn for this. They discussed conservative vs. Surgical interventions. At this time, she would like to consider complete hysterectomy. Discussed calling the office to schedule appointment to be seen. She is also concerned about difficulty with losing weight and concentration. She was hoping there may be some sort of prescription which may help her to stay focused, especially while at work, as well as to help her lose weight. She has been on Concerta in the past but has not been on this for a few years.     Current Medication: Outpatient Encounter Medications as of 11/14/2019  Medication Sig  . amLODipine (NORVASC) 5 MG tablet Take 1 tablet (5 mg total) by mouth daily.  Marland Kitchen atorvastatin (LIPITOR) 20 MG tablet TAKE 1 TABLET EVERY DAY USUALLY IN THE EVENING  . esomeprazole (NEXIUM) 40 MG capsule   . fexofenadine-pseudoephedrine (ALLEGRA-D ALLERGY & CONGESTION) 180-240 MG 24 hr tablet Take 1 tablet by mouth daily.  . fluticasone (FLONASE) 50 MCG/ACT nasal spray USE 2 SPRAYS IN EACH NOSTRIL EVERY DAY  . methylphenidate (CONCERTA) 18 MG PO CR tablet Take 1 tablet (18 mg total) by mouth daily.  Marland Kitchen rOPINIRole (REQUIP) 2 MG tablet TAKE 1 TABLET BY MOUTH TWICE A DAY AS NEEDED   No facility-administered encounter medications on file as of 11/14/2019.    Surgical History: Past  Surgical History:  Procedure Laterality Date  . COLONOSCOPY WITH PROPOFOL N/A 06/18/2017   Procedure: COLONOSCOPY WITH PROPOFOL;  Surgeon: Lucilla Lame, MD;  Location: [facility];  Service: Endoscopy;  Laterality: N/A;  . ESOPHAGOGASTRODUODENOSCOPY (EGD) WITH PROPOFOL N/A 06/18/2017   Procedure: ESOPHAGOGASTRODUODENOSCOPY (EGD) WITH PROPOFOL;  Surgeon: Lucilla Lame, MD;  Location: [facility];  Service: Endoscopy;  Laterality: N/A;  . ESOPHAGOGASTRODUODENOSCOPY (EGD) WITH PROPOFOL N/A 10/05/2017   Procedure: ESOPHAGOGASTRODUODENOSCOPY (EGD) WITH PROPOFOL;  Surgeon: Lucilla Lame, MD;  Location: [facility];  Service: Endoscopy;  Laterality: N/A;  . POLYPECTOMY  06/18/2017   Procedure: POLYPECTOMY;  Surgeon: Lucilla Lame, MD;  Location: [facility];  Service: Endoscopy;;  . TUBAL LIGATION  1980    Medical History: Past Medical History:  Diagnosis Date  . Asthma   . H/O exercise stress test    at 56 years age  . Heart murmur   . Hyperlipidemia   . Hypertension   . Jaundice    In High school  . Jaundice    hx     Family History: Family History  Problem Relation Age of Onset  . Heart attack Mother   . Heart disease Mother 11  . Heart attack Father   . Heart disease Father 46  . Diabetes Sister   . Heart disease Brother   . Heart disease Brother   . Diabetes Maternal Grandmother   . Breast cancer Other   . Colon cancer Neg  Hx       Review of Systems  Constitutional: Negative for activity change, chills, fatigue and unexpected weight change.  HENT: Negative for congestion, postnasal drip, rhinorrhea, sneezing and sore throat.   Respiratory: Negative for cough, chest tightness, shortness of breath and wheezing.   Cardiovascular: Negative for chest pain and palpitations.       Blood pressure elevated today. She should be taking amlodipine, but has not been taking it.   Gastrointestinal: Negative for abdominal pain, constipation, diarrhea, nausea  and vomiting.  Endocrine: Negative for cold intolerance, heat intolerance, polydipsia and polyuria.  Genitourinary: Positive for decreased urine volume and frequency. Negative for dyspareunia and dysuria.       The patient has mild/moderate prolapsed uterus.   Musculoskeletal: Negative for arthralgias, back pain, joint swelling and neck pain.  Skin: Negative for rash.  Allergic/Immunologic: Negative for environmental allergies.  Neurological: Negative for dizziness, tremors, numbness and headaches.  Hematological: Negative for adenopathy. Does not bruise/bleed easily.  Psychiatric/Behavioral: Positive for decreased concentration. Negative for behavioral problems (Depression), sleep disturbance and suicidal ideas. The patient is not nervous/anxious.     Today's Vitals   11/14/19 1018  BP: (!) 155/90  Pulse: 72  Resp: 16  SpO2: 96%  Weight: 142 lb 6.4 oz (64.6 kg)  Height: 4\' 11"  (1.499 m)   Body mass index is 28.76 kg/m.  Physical Exam Vitals and nursing note reviewed.  Constitutional:      General: She is not in acute distress.    Appearance: Normal appearance. She is well-developed. She is not diaphoretic.  HENT:     Head: Normocephalic and atraumatic.     Nose: Nose normal.     Mouth/Throat:     Pharynx: No oropharyngeal exudate.  Eyes:     Pupils: Pupils are equal, round, and reactive to light.  Neck:     Thyroid: No thyromegaly.     Vascular: No carotid bruit or JVD.     Trachea: No tracheal deviation.  Cardiovascular:     Rate and Rhythm: Normal rate and regular rhythm.     Pulses: Normal pulses.     Heart sounds: Normal heart sounds. No murmur. No friction rub. No gallop.   Pulmonary:     Effort: Pulmonary effort is normal. No respiratory distress.     Breath sounds: Normal breath sounds. No wheezing or rales.  Chest:     Chest wall: No tenderness.     Breasts:        Right: Normal. No swelling, bleeding, inverted nipple, mass, nipple discharge, skin change or  tenderness.        Left: No swelling, bleeding, inverted nipple, mass, nipple discharge, skin change or tenderness.  Abdominal:     General: Bowel sounds are normal.     Palpations: Abdomen is soft.     Tenderness: There is no abdominal tenderness.  Musculoskeletal:        General: Normal range of motion.     Cervical back: Normal range of motion and neck supple.  Lymphadenopathy:     Cervical: No cervical adenopathy.     Upper Body:     Right upper body: No axillary adenopathy.     Left upper body: No axillary adenopathy.  Skin:    General: Skin is warm and dry.  Neurological:     Mental Status: She is alert and oriented to person, place, and time.     Cranial Nerves: No cranial nerve deficit.  Psychiatric:  Mood and Affect: Mood normal.        Behavior: Behavior normal.        Thought Content: Thought content normal.        Judgment: Judgment normal.    Depression screen [facility] 2/9 11/14/2019 02/18/2019 09/09/2018 06/21/2018 04/08/2018  Decreased Interest 0 0 0 0 0  Down, Depressed, Hopeless 0 0 0 0 0  PHQ - 2 Score 0 0 0 0 0    Functional Status Survey: Is the patient deaf or have difficulty hearing?: No Does the patient have difficulty seeing, even when wearing glasses/contacts?: No Does the patient have difficulty concentrating, remembering, or making decisions?: No Does the patient have difficulty walking or climbing stairs?: No Does the patient have difficulty dressing or bathing?: No Does the patient have difficulty doing errands alone such as visiting a doctor's office or shopping?: No  MMSE - [place] Exam 11/14/2019 06/21/2018  Orientation to time 5 5  Orientation to Place 5 5  Registration 3 3  Attention/ Calculation 5 5  Recall 3 3  Language- name 2 objects 2 2  Language- repeat 1 1  Language- follow 3 step command 3 3  Language- read & follow direction 1 1  Write a sentence 1 1  Copy design 1 1  Total score 30 30    Fall Risk  11/14/2019  02/18/2019 09/09/2018 06/21/2018 04/08/2018  Falls in the past year? 0 0 0 No No  Number falls in past yr: - 0 - - -  Injury with Fall? - 0 - - -      LABS: Recent Results (from the past 2160 hour(s))  UA/M w/rflx Culture, Routine     Status: Abnormal (Preliminary result)   Collection Time: 11/14/19 10:41 AM   Specimen: Urine   URINE  Result Value Ref Range   Specific Gravity, UA 1.023 1.005 - 1.030   pH, UA 5.5 5.0 - 7.5   Color, UA Yellow Yellow   Appearance Ur Clear Clear   Leukocytes,UA 2+ (A) Negative   Protein,UA Negative Negative/Trace   Glucose, UA Negative Negative   Ketones, UA Negative Negative   RBC, UA Negative Negative   Bilirubin, UA Negative Negative   Urobilinogen, Ur 0.2 0.2 - 1.0 mg/dL   Nitrite, UA Negative Negative   Microscopic Examination See below:     Comment: Microscopic was indicated and was performed.   Urinalysis Reflex WILL FOLLOW   Microscopic Examination     Status: None (Preliminary result)   Collection Time: 11/14/19 10:41 AM   URINE  Result Value Ref Range   WBC, UA WILL FOLLOW    RBC WILL FOLLOW    Epithelial Cells (non renal) WILL FOLLOW    Renal Epithel, UA WILL FOLLOW    Casts WILL FOLLOW    Cast Type WILL FOLLOW    Crystals WILL FOLLOW    Crystal Type WILL FOLLOW    Mucus, UA WILL FOLLOW    Bacteria, UA WILL FOLLOW    Yeast, UA WILL FOLLOW    Trichomonas, UA WILL FOLLOW    Urinalysis Comments WILL FOLLOW     Assessment/Plan: 1. Encounter for general adult medical examination with abnormal findings Annual health maintenance exam today. Lab slip given to patient to have routine, fasting labs drawn.   2. Essential hypertension Advised patient to restart her amlodipine 5mg  daily. Limit salt take intake and increase water intake in the diet. Monitor closely.   3. Abnormal weight gain Will check routine,  fasting labs.   4. Concentration deficit Patient has been on concerta 18mg  daily in the past. Will consider restarting this  if she begnis taking blood pressure medication and blood pressure is improved at her next visit.   5. Dysuria - UA/M w/rflx Culture, Routine  General Counseling: Aalayah verbalizes understanding of the findings of todays visit and agrees with plan of treatment. I have discussed any further diagnostic evaluation that may be needed or ordered today. We also reviewed her medications today. she has been encouraged to call the office with any questions or concerns that should arise related to todays visit.    Counseling:  Hypertension Counseling:   The following hypertensive lifestyle modification were recommended and discussed:  1. Limiting alcohol intake to less than 1 oz/day of ethanol:(24 oz of beer or 8 oz of wine or 2 oz of 100-proof whiskey). 2. Take baby ASA 81 mg daily. 3. Importance of regular aerobic exercise and losing weight. 4. Reduce dietary saturated fat and cholesterol intake for overall cardiovascular health. 5. Maintaining adequate dietary potassium, calcium, and magnesium intake. 6. Regular monitoring of the blood pressure. 7. Reduce sodium intake to less than 100 mmol/day (less than 2.3 gm of sodium or less than 6 gm of sodium choride)   This patient was seen by [place] with Dr Lavera Guise as a part of collaborative care agreement  Orders Placed This Encounter  Procedures  . Microscopic Examination  . UA/M w/rflx Culture, Routine     Total time spent: 62 Minutes  Time spent includes review of chart, medications, test results, and follow up plan with the patient.     Lavera Guise, MD  Internal Medicine

## 2019-11-16 DIAGNOSIS — I10 Essential (primary) hypertension: Secondary | ICD-10-CM | POA: Insufficient documentation

## 2019-11-16 DIAGNOSIS — Z0001 Encounter for general adult medical examination with abnormal findings: Secondary | ICD-10-CM | POA: Insufficient documentation

## 2019-11-16 DIAGNOSIS — R41840 Attention and concentration deficit: Secondary | ICD-10-CM | POA: Insufficient documentation

## 2019-11-16 DIAGNOSIS — R300 Dysuria: Secondary | ICD-10-CM | POA: Insufficient documentation

## 2019-11-18 LAB — URINE CULTURE, REFLEX

## 2019-11-18 LAB — UA/M W/RFLX CULTURE, ROUTINE
Bilirubin, UA: NEGATIVE
Glucose, UA: NEGATIVE
Ketones, UA: NEGATIVE
Nitrite, UA: NEGATIVE
Protein,UA: NEGATIVE
RBC, UA: NEGATIVE
Specific Gravity, UA: 1.023 (ref 1.005–1.030)
Urobilinogen, Ur: 0.2 mg/dL (ref 0.2–1.0)
pH, UA: 5.5 (ref 5.0–7.5)

## 2019-11-18 LAB — MICROSCOPIC EXAMINATION: Casts: NONE SEEN /lpf

## 2019-11-18 NOTE — Progress Notes (Signed)
Waiting on sensitivity results.

## 2019-11-24 ENCOUNTER — Ambulatory Visit: Payer: Medicare HMO | Attending: Internal Medicine

## 2019-11-24 DIAGNOSIS — Z23 Encounter for immunization: Secondary | ICD-10-CM | POA: Insufficient documentation

## 2019-11-24 NOTE — Progress Notes (Signed)
   Covid-19 Vaccination Clinic  Name:  [name]    MRN: [id number] DOB: 09-24-1946  11/24/2019  [name] was observed post Covid-19 immunization for 15 minutes without incidence. She was provided with Vaccine Information Sheet and instruction to access the V-Safe system.   Ms. Manner was instructed to call 911 with any severe reactions post vaccine: Marland Kitchen Difficulty breathing  . Swelling of your face and throat  . A fast heartbeat  . A bad rash all over your body  . Dizziness and weakness    Immunizations Administered    Name Date Dose VIS Date Route   Pfizer COVID-19 Vaccine 11/24/2019  8:53 AM 0.3 mL 10/10/2019 Intramuscular   Manufacturer: [place]   Lot: [id number]   [place]: [id number]

## 2019-11-26 ENCOUNTER — Telehealth: Payer: Self-pay

## 2019-11-26 ENCOUNTER — Other Ambulatory Visit: Payer: Self-pay | Admitting: Nurse Practitioner

## 2019-11-26 DIAGNOSIS — N390 Urinary tract infection, site not specified: Secondary | ICD-10-CM

## 2019-11-26 MED ORDER — NITROFURANTOIN MONOHYD MACRO 100 MG PO CAPS: 100.0000 mg | ORAL_CAPSULE | Freq: Two times a day (BID) | ORAL | 0 refills | Status: DC

## 2019-11-26 NOTE — Progress Notes (Signed)
Please let the patient know that urine sample from her physical was postivie for uti. I have added macrobid 100mg  twice daily for 7 days. This was sent to [facility]. thanks

## 2019-11-26 NOTE — Telephone Encounter (Signed)
-----   Message from Ronnell Freshwater, NP sent at 11/26/2019 12:55 PM EST ----- Please let the patient know that urine sample from her physical was postivie for uti. I have added macrobid 100mg  twice daily for 7 days. This was sent to [facility]. thanks

## 2019-11-26 NOTE — Progress Notes (Signed)
urine sample from her physical was postivie for uti. I have added macrobid 100mg  twice daily for 7 days. This was sent to [facility]

## 2019-11-26 NOTE — Telephone Encounter (Signed)
Pt advised that urine sample from physical showed UTI we send macrobid to her phar

## 2019-11-28 ENCOUNTER — Ambulatory Visit: Payer: Medicare PPO | Admitting: Family

## 2019-11-28 ENCOUNTER — Ambulatory Visit (INDEPENDENT_AMBULATORY_CARE_PROVIDER_SITE_OTHER): Payer: Medicare HMO | Admitting: Nurse Practitioner

## 2019-11-28 ENCOUNTER — Encounter: Payer: Self-pay | Admitting: Nurse Practitioner

## 2019-11-28 VITALS — BP 137/77 | Ht 59.0 in | Wt 140.0 lb

## 2019-11-28 DIAGNOSIS — I10 Essential (primary) hypertension: Secondary | ICD-10-CM

## 2019-11-28 DIAGNOSIS — R41840 Attention and concentration deficit: Secondary | ICD-10-CM

## 2019-11-28 MED ORDER — METHYLPHENIDATE HCL ER (OSM) 18 MG PO TBCR: 18.0000 mg | EXTENDED_RELEASE_TABLET | Freq: Every day | ORAL | 0 refills | Status: DC

## 2019-11-28 NOTE — Progress Notes (Signed)
[facility], [place] [address]  Internal MEDICINE  Telephone Visit  Patient Name: [name]  [id number]  [id number]  Date of Service: 11/28/2019  I connected with the patient at 8:50am by webcam and verified the patients identity using two identifiers.   I discussed the limitations, risks, security and privacy concerns of performing an evaluation and management service by webcam and the availability of in person appointments. I also discussed with the patient that there may be a patient responsible charge related to the service.  The patient expressed understanding and agrees to proceed.    Chief Complaint  Patient presents with  . Telephone Assessment  . Telephone Screen  . Follow-up    REVIEW LABS??? DID YOU GIVE PT LAB SHEET?  Marland Kitchen Hypertension    The patient has been contacted via webcam for follow up visit due to concerns for spread of novel coronavirus. The patient presents for follow up visit. Blood pressure moderately elevated at appointment for her wellness exam. Had not been taking prescribed amlodipine for some time. She had wanted to restart concerta. This medication helped her with concentration deficit as well as helped curb appetite. uable to start this at her CPE due to elevated pressure. Since her annual physical, she has been taking her amlodipine every day. She is tolerating this well and denies negative side effects. Her blood pressure is much improved. She has not had labs doen yet, as she has not had the opportunity to do so. Plans to have them done next week.       Current Medication: Outpatient Encounter Medications as of 11/28/2019  Medication Sig  . amLODipine (NORVASC) 5 MG tablet Take 1 tablet (5 mg total) by mouth daily.  Marland Kitchen atorvastatin (LIPITOR) 20 MG tablet TAKE 1 TABLET EVERY DAY USUALLY IN THE EVENING  . esomeprazole (NEXIUM) 40 MG capsule   . fexofenadine-pseudoephedrine (ALLEGRA-D ALLERGY & CONGESTION) 180-240 MG  24 hr tablet Take 1 tablet by mouth daily.  . fluticasone (FLONASE) 50 MCG/ACT nasal spray USE 2 SPRAYS IN EACH NOSTRIL EVERY DAY  . methylphenidate (CONCERTA) 18 MG PO CR tablet Take 1 tablet (18 mg total) by mouth daily.  . nitrofurantoin, macrocrystal-monohydrate, (MACROBID) 100 MG capsule Take 1 capsule (100 mg total) by mouth 2 (two) times daily.  Marland Kitchen rOPINIRole (REQUIP) 2 MG tablet TAKE 1 TABLET BY MOUTH TWICE A DAY AS NEEDED  . [DISCONTINUED] methylphenidate (CONCERTA) 18 MG PO CR tablet Take 1 tablet (18 mg total) by mouth daily.   No facility-administered encounter medications on file as of 11/28/2019.    Surgical History: Past Surgical History:  Procedure Laterality Date  . COLONOSCOPY WITH PROPOFOL N/A 06/18/2017   Procedure: COLONOSCOPY WITH PROPOFOL;  Surgeon: Lucilla Lame, MD;  Location: [place];  Service: Endoscopy;  Laterality: N/A;  . ESOPHAGOGASTRODUODENOSCOPY (EGD) WITH PROPOFOL N/A 06/18/2017   Procedure: ESOPHAGOGASTRODUODENOSCOPY (EGD) WITH PROPOFOL;  Surgeon: Lucilla Lame, MD;  Location: [place];  Service: Endoscopy;  Laterality: N/A;  . ESOPHAGOGASTRODUODENOSCOPY (EGD) WITH PROPOFOL N/A 10/05/2017   Procedure: ESOPHAGOGASTRODUODENOSCOPY (EGD) WITH PROPOFOL;  Surgeon: Lucilla Lame, MD;  Location: [place];  Service: Endoscopy;  Laterality: N/A;  . POLYPECTOMY  06/18/2017   Procedure: POLYPECTOMY;  Surgeon: Lucilla Lame, MD;  Location: [place];  Service: Endoscopy;;  . TUBAL LIGATION  1980    Medical History: Past Medical History:  Diagnosis Date  . Asthma   . H/O exercise stress test    at  20 years age  . Heart murmur   . Hyperlipidemia   . Hypertension   . Jaundice    In High school  . Jaundice    hx     Family History: Family History  Problem Relation Age of Onset  . Heart attack Mother   . Heart disease Mother 38  . Heart attack Father   . Heart disease Father 2  . Diabetes Sister   . Heart disease  Brother   . Heart disease Brother   . Diabetes Maternal Grandmother   . Breast cancer Other   . Colon cancer Neg Hx     Social History   Socioeconomic History  . Marital status: Married    Spouse name: Not on file  . Number of children: 3  . Years of education: Not on file  . Highest education level: Not on file  Occupational History  . Occupation: Self Employed - Hairdresser  Tobacco Use  . Smoking status: Never Smoker  . Smokeless tobacco: Never Used  Substance and Sexual Activity  . Alcohol use: Yes    Alcohol/week: 7.0 standard drinks    Types: 7 Glasses of wine per week    Comment: Wine nightly  . Drug use: No  . Sexual activity: Not on file  Other Topics Concern  . Not on file  Social History Narrative   Lives in [place]. Works at [employer] in [employer]      Daily Caffeine Use:  2 coffee   Regular Exercise -  NO            Social Determinants of Health   Financial Resource Strain:   . Difficulty of Paying Living Expenses: Not on file  Food Insecurity:   . Worried About Charity fundraiser in the Last Year: Not on file  . Ran Out of Food in the Last Year: Not on file  Transportation Needs:   . Lack of Transportation (Medical): Not on file  . Lack of Transportation (Non-Medical): Not on file  Physical Activity:   . Days of Exercise per Week: Not on file  . Minutes of Exercise per Session: Not on file  Stress:   . Feeling of Stress : Not on file  Social Connections:   . Frequency of Communication with Friends and Family: Not on file  . Frequency of Social Gatherings with Friends and Family: Not on file  . Attends Religious Services: Not on file  . Active Member of Clubs or Organizations: Not on file  . Attends Archivist Meetings: Not on file  . Marital Status: Not on file  Intimate Partner Violence:   . Fear of Current or Ex-Partner: Not on file  . Emotionally Abused: Not on file  . Physically Abused: Not on file  . Sexually Abused: Not on  file      Review of Systems  Constitutional: Negative for activity change, chills, fatigue and unexpected weight change.  HENT: Negative for congestion, postnasal drip, rhinorrhea, sneezing and sore throat.   Respiratory: Negative for cough, chest tightness, shortness of breath and wheezing.   Cardiovascular: Negative for chest pain and palpitations.       Blood pressure much improved today. Now taking prescribed blood pressure medication.   Gastrointestinal: Negative for abdominal pain, constipation, diarrhea, nausea and vomiting.  Endocrine: Negative for cold intolerance, heat intolerance, polydipsia and polyuria.  Musculoskeletal: Negative for arthralgias, back pain, joint swelling and neck pain.  Skin: Negative for rash.  Allergic/Immunologic: Negative  for environmental allergies.  Neurological: Negative for dizziness, tremors, numbness and headaches.  Hematological: Negative for adenopathy. Does not bruise/bleed easily.  Psychiatric/Behavioral: Positive for decreased concentration. Negative for behavioral problems (Depression), sleep disturbance and suicidal ideas. The patient is not nervous/anxious.     Today's Vitals   11/28/19 0842  BP: 137/77  Weight: 140 lb (63.5 kg)  Height: 4\' 11"  (1.499 m)   Body mass index is 28.28 kg/m.  Observation/Objective:   The patient is alert and oriented. She is pleasant and answers all questions appropriately. Breathing is non-labored. She is in no acute distress at this time.    Assessment/Plan: 1. Essential hypertension Blood pressure much improved. She should conitnue to take amlodipine 5mg  daily.   2. Concentration deficit Restart Concerta 18mg  CR daily as needed. Reviewed risk factors and possible side effects associated with taking stimulant medication. She has taken this before and has done well. Understands the risks and side effects.  - methylphenidate (CONCERTA) 18 MG PO CR tablet; Take 1 tablet (18 mg total) by mouth daily.   Dispense: 30 tablet; Refill: 0  General Counseling: Raneshia verbalizes understanding of the findings of today's phone visit and agrees with plan of treatment. I have discussed any further diagnostic evaluation that may be needed or ordered today. We also reviewed her medications today. she has been encouraged to call the office with any questions or concerns that should arise related to todays visit.   Refilled Controlled medications today. Reviewed risks and possible side effects associated with taking Stimulants. Combination of these drugs with other psychotropic medications could cause dizziness and drowsiness. Pt needs to Monitor symptoms and exercise caution in driving and operating heavy machinery to avoid damages to oneself, to others and to the surroundings. Patient verbalized understanding in this matter. Dependence and abuse for these drugs will be monitored closely. A Controlled substance policy and procedure is on file which allows [facility] to order a urine drug screen test at any visit. Patient understands and agrees with the plan.Marland Kitchen   Marland KitchenThis patient was seen by [place] with Dr Lavera Guise as a part of collaborative care agreement   Meds ordered this encounter  Medications  . methylphenidate (CONCERTA) 18 MG PO CR tablet    Sig: Take 1 tablet (18 mg total) by mouth daily.    Dispense:  30 tablet    Refill:  0    Order Specific Question:   Supervising Provider    Answer:   Lavera Guise [id number]    Time spent: 68 Minutes    Dr Lavera Guise Internal medicine

## 2019-11-30 NOTE — Progress Notes (Signed)
Office Visit    Patient Name: [name] Date of Encounter: 12/01/2019  Primary Care Provider:  Lavera Guise, MD Primary Cardiologist:  Ida Rogue, MD Electrophysiologist:  None   Chief Complaint    [name] is a 74 y.o. female with a hx of HTN, GERD, HLD, elevated calcium score presents today for follow up of coronary artery atherosclerosis.    Past Medical History    Past Medical History:  Diagnosis Date  . Asthma   . H/O exercise stress test    at 19 years age  . Heart murmur   . Hyperlipidemia   . Hypertension   . Jaundice    In High school  . Jaundice    hx    Past Surgical History:  Procedure Laterality Date  . COLONOSCOPY WITH PROPOFOL N/A 06/18/2017   Procedure: COLONOSCOPY WITH PROPOFOL;  Surgeon: Lucilla Lame, MD;  Location: [place];  Service: Endoscopy;  Laterality: N/A;  . ESOPHAGOGASTRODUODENOSCOPY (EGD) WITH PROPOFOL N/A 06/18/2017   Procedure: ESOPHAGOGASTRODUODENOSCOPY (EGD) WITH PROPOFOL;  Surgeon: Lucilla Lame, MD;  Location: [place];  Service: Endoscopy;  Laterality: N/A;  . ESOPHAGOGASTRODUODENOSCOPY (EGD) WITH PROPOFOL N/A 10/05/2017   Procedure: ESOPHAGOGASTRODUODENOSCOPY (EGD) WITH PROPOFOL;  Surgeon: Lucilla Lame, MD;  Location: [place];  Service: Endoscopy;  Laterality: N/A;  . POLYPECTOMY  06/18/2017   Procedure: POLYPECTOMY;  Surgeon: Lucilla Lame, MD;  [facility];  Service: Endoscopy;;  . TUBAL LIGATION  1980    Allergies  Allergies  Allergen Reactions  . Codeine Nausea Only  . Penicillins Swelling and Rash    History of Present Illness    [name] is a 74 y.o. female with a hx of HTN, GERD, elevated calcium score, HLD last seen by Dr. Rockey Situ 11/11/18.  Previous echo per report with nomal LVEF, moderate LVH with septal hypertrophy, diastolic dysfunction, mild MR/TR/AR, mild pulmonary hypertension. Per her report on statin in the past for carotid ultrasound  with mild buildup, but results unavailable for review. She has family history of CAD and subsequent CT calcium score was ordered. Calcium score 11/25/18 with score of 123 placing her in 75th percentile for risk.   Seen by family medicine 11/28/19 with noted improvement of BP after initiation of Amlodipine 5mg  daily. Her Concerta 18mg  CR was renewed as needed for concentration defecity.   She reports feeling well.  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  She denies chest pain, pressure, tightness.  She has been working with orthopedics for hip pain and is receiving relief from injections.  She is hopeful they will be able to help her with some new onset knee pain.  No formal exercise regimen but does stay very active working this long.  She had questions regarding some previous studies done by her primary care.  Tells me number of years ago Dr. Humphrey Rolls "heard something in her neck" and had her evaluated with ultrasound but was told there was just mild blockage.  She is asymptomatic.  We discussed the possibility of repeat carotid duplex as previous studies are not available to me, she politely declined.  We discussed the importance of lipid management.  She is interested in trying to lose weight.  She has heard a lot about the keto diet and plans to start.  Encouraged her to track her weight at home, check how she feels, track sleep.  She reports some dyspnea intermittently with more than  usual activity.  She attributes this with being slightly overweight and out of shape.  She has an exercise tolerance of >4METS. Tells me this problem is intermittent.   EKGs/Labs/Other Studies Reviewed:   The following studies were reviewed today: CT Calcium 11/25/18 FINDINGS: Non-cardiac: See separate report from [facility].   Ascending aorta: Normal diameter 3.7 cm   Pericardium: Normal   Coronary arteries: Calcium noted in the proximal LAD/RCA as well as mid LAD     IMPRESSION: Coronary calcium score of 123. This was 66 th percentile for age and sex matched control.   [place]  EKG:  EKG is ordered today.  The ekg ordered today demonstrates sinus and 7 5 bpm with left axis deviation-possible left atrial enlargement, incomplete RBBB-no acute ST/T wave changes  Recent Labs: No results found for requested labs within last 8760 hours.  Recent Lipid Panel    Component Value Date/Time   CHOL 188 04/08/2018 1040   TRIG 60 04/08/2018 1040   HDL 82 04/08/2018 1040   CHOLHDL 2.3 04/08/2018 1040   CHOLHDL 2 08/02/2015 1005   VLDL 8.2 08/02/2015 1005   LDLCALC 94 04/08/2018 1040   LDLDIRECT 158.6 02/24/2013 1056    Home Medications   Current Meds  Medication Sig  . amLODipine (NORVASC) 5 MG tablet Take 1 tablet (5 mg total) by mouth daily.  Marland Kitchen atorvastatin (LIPITOR) 20 MG tablet TAKE 1 TABLET EVERY DAY USUALLY IN THE EVENING  . esomeprazole (NEXIUM) 40 MG capsule   . fexofenadine-pseudoephedrine (ALLEGRA-D ALLERGY & CONGESTION) 180-240 MG 24 hr tablet Take 1 tablet by mouth daily.  . fluticasone (FLONASE) 50 MCG/ACT nasal spray USE 2 SPRAYS IN EACH NOSTRIL EVERY DAY  . ibuprofen (ADVIL) 100 MG tablet Take 100 mg by mouth every 6 (six) hours as needed for fever. Taking as needed  . methylphenidate (CONCERTA) 18 MG PO CR tablet Take 1 tablet (18 mg total) by mouth daily.  . nitrofurantoin, macrocrystal-monohydrate, (MACROBID) 100 MG capsule Take 1 capsule (100 mg total) by mouth 2 (two) times daily.  Marland Kitchen rOPINIRole (REQUIP) 2 MG tablet TAKE 1 TABLET BY MOUTH TWICE A DAY AS NEEDED  . [DISCONTINUED] amLODipine (NORVASC) 5 MG tablet Take 1 tablet (5 mg total) by mouth daily.    Review of Systems      Review of Systems  Constitution: Negative for chills, fever and malaise/fatigue.  Cardiovascular: Positive for dyspnea on exertion. Negative for chest pain, leg swelling, near-syncope, orthopnea, palpitations and syncope.  Respiratory: Negative for  cough, shortness of breath and wheezing.   Musculoskeletal: Positive for arthritis.  Gastrointestinal: Negative for nausea and vomiting.  Neurological: Negative for dizziness, light-headedness and weakness.   All other systems reviewed and are otherwise negative except as noted above.  Physical Exam    VS:  BP 116/68 (BP Location: Left Arm, Patient Position: Sitting, Cuff Size: Normal)   Pulse 75   Ht 4\' 11"  (1.499 m)   Wt 142 lb 8 oz (64.6 kg)   SpO2 96%   BMI 28.78 kg/m  , BMI Body mass index is 28.78 kg/m. GEN: Well nourished, well developed, in no acute distress. HEENT: normal. Neck: Supple, no JVD, carotid bruits, or masses. Cardiac: RRR, no murmurs, rubs, or gallops. No clubbing, cyanosis, edema.  Radials/DP/PT 2+ and equal bilaterally.  Respiratory:  Respirations regular and unlabored, clear to auscultation bilaterally. GI: Soft, nontender, nondistended, BS + x 4. MS: No deformity or atrophy. Skin: Warm and dry, no rash. Neuro:  Strength  and sensation are intact. Psych: Normal affect.  Accessory Clinical Findings    ECG personally reviewed by me today -sinus and 75 bpm with left axis deviation-possible left atrial enlargement and incomplete RBBB-no acute ST/T wave changes- no acute changes.  Assessment & Plan    1. Coronary artery atherosclerosis - Calcium score 129 in 2019 placing her in 75th risk percentile for age/sex.  No anginal symptoms-no indication for ischemic evaluation at this time.  We discussed prevention strategies including healthy diet, lifestyle.  She will start aspirin 81 mg daily.  Continue atorvastatin.  No beta-blocker at this time as her blood pressure and heart rate are well controlled.  Continue amlodipine 5 mg daily. 2. HTN -blood pressure well controlled.  Continue amlodipine 5 mg daily.  Refill sent. 3. HLD -no recent lipid profile available for review.  Continue atorvastatin 20 mg daily.  LDL goal less than 70 in the setting of coronary artery  atherosclerosis noted by elevated calcium score.  She had labs done today at her PCP-unclear if lipid panel was drawn.  Will review labs when they result, if lipid panel not drawn she will return for fasting lipid profile.  Disposition: Follow up in 1 year(s) with Dr. Rockey Situ or APP.    Loel Dubonnet, NP 12/01/2019, 10:17 AM

## 2019-12-01 ENCOUNTER — Ambulatory Visit (INDEPENDENT_AMBULATORY_CARE_PROVIDER_SITE_OTHER): Payer: Medicare HMO | Admitting: Family

## 2019-12-01 ENCOUNTER — Other Ambulatory Visit: Payer: Self-pay

## 2019-12-01 ENCOUNTER — Other Ambulatory Visit: Payer: Self-pay | Admitting: Nurse Practitioner

## 2019-12-01 ENCOUNTER — Encounter: Payer: Self-pay | Admitting: Family

## 2019-12-01 VITALS — BP 116/68 | HR 75 | Ht 59.0 in | Wt 142.5 lb

## 2019-12-01 DIAGNOSIS — Z0001 Encounter for general adult medical examination with abnormal findings: Secondary | ICD-10-CM | POA: Diagnosis not present

## 2019-12-01 DIAGNOSIS — E559 Vitamin D deficiency, unspecified: Secondary | ICD-10-CM | POA: Diagnosis not present

## 2019-12-01 DIAGNOSIS — I2510 Atherosclerotic heart disease of native coronary artery without angina pectoris: Secondary | ICD-10-CM | POA: Diagnosis not present

## 2019-12-01 DIAGNOSIS — I10 Essential (primary) hypertension: Secondary | ICD-10-CM

## 2019-12-01 DIAGNOSIS — E782 Mixed hyperlipidemia: Secondary | ICD-10-CM | POA: Diagnosis not present

## 2019-12-01 MED ORDER — AMLODIPINE BESYLATE 5 MG PO TABS: 5.0000 mg | ORAL_TABLET | Freq: Every day | ORAL | 3 refills | Status: DC

## 2019-12-01 MED ORDER — ASPIRIN EC 81 MG PO TBEC: 81.0000 mg | DELAYED_RELEASE_TABLET | Freq: Every day | ORAL | 3 refills | Status: DC

## 2019-12-01 NOTE — Patient Instructions (Addendum)
Medication Instructions:  Your physician has recommended you make the following change in your medication:   START Aspirin EC 81mg  daily  *If you need a refill on your cardiac medications before your next appointment, please call your pharmacy*  Lab Work: None today. If your cholesterol levels were not checked, we will plan to check.   Testing/Procedures: You had an EKG today. It showed normal sinus rhythm.  You had that calcium score of 123 which shows that you have calcium or plaque in your coronary arteries and a risk for heart disease. We will lower this risk through the "A, B, C's" or aspirin, blood pressure control, and cholesterol control.  Follow-Up: At [facility], you and your health needs are our priority.  As part of our continuing mission to provide you with exceptional heart care, we have created designated Provider Care Teams.  These Care Teams include your primary Cardiologist (physician) and Advanced Practice Providers (APPs -  Physician Assistants and Nurse Practitioners) who all work together to provide you with the care you need, when you need it.  Your next appointment:   1 year(s)  The format for your next appointment:   In Person  Provider:    You may see Ida Rogue, MD or one of the following Advanced Practice Providers on your designated Care Team:    Murray Hodgkins, NP  Christell Faith, PA-C  Marrianne Mood, PA-C  Other Instructions   Ketogenic Eating Plan, Adult A ketogenic eating plan is a diet that is very low in carbohydrates, moderately low in protein, and very high in fat. Your body normally gets energy from eating carbohydrates. If you limit carbohydrates in your diet, your body will start to use stored fat as an energy source. When fat is broken down for energy, it enters your blood as a substance called ketones. A ketogenic eating plan relies mostly on ketones for energy. This eating plan can cause rapid weight loss because the body burns  fat for fuel. What are the benefits of a ketogenic eating plan? Epilepsy A ketogenic diet is sometimes used to treat epilepsy in children who have seizures that are not helped by medicines. As a treatment for epilepsy in children, ketogenic eating plans have been well studied and used for many years. This type of diet is usually started in the hospital and carefully monitored by a health care team. It is usually tried for several months to see if it will lessen seizures. Other conditions Ketogenic eating plans have also been studied to help treat other conditions, including:  Cancer.  Type 2 diabetes.  Alzheimer's disease.  Parkinson's disease.  Autism.  Multiple sclerosis. More studies need to be done to see how well this eating plan works for these and other conditions and what the long-term side effects might be. What are the side effects of a ketogenic eating plan? Side effects of a ketogenic diet may include:  Vitamin deficiencies.  Loss of body fluids (dehydration).  Bad breath.  Nausea.  Hunger.  Difficulty passing stool (constipation).  Problems with menstrual periods.  Inflammation of the pancreas.  Kidney stones or gall bladder stones.  Bone fractures.  High cholesterol. What are tips for following this plan? Reading food labels  Look for foods that have a low glycemic index (GI) label.  Read food ingredient lists to check for any hidden or added sugar.  Check food labels for the number of grams of carbohydrates and protein. This ensures that you are eating the right  amounts of these nutrients. This is important. Cooking  Carefully measure or weigh foods.  Make desserts using ketogenic or low GI recipes.  Avoid cooking with sauces that contain added sugar, such as barbecue sauce or ketchup. Meal planning  Aim for a daily meal and snack time schedule that you can follow consistently.  Every meal will include high-fat items such as avocado, cream,  butter, or mayonnaise. General information   Buy a gram scale to weigh foods. This is necessary to follow this diet correctly and accurately. Your dietitian will teach you how to use one for this diet.  Ask your health care provider what steps you can take to avoid side effects of this eating plan, such as constipation or kidney stones.  Take vitamin and mineral supplements only as told by a health care provider or dietitian.  Work with your dietitian and health care provider to handle the key challenges of this diet and to help you stay on track. What foods should I eat?  Fruits Fresh pineapple, peaches, and apples. Other fresh and frozen fruits in small amounts. Vegetables Lettuce. Beets. Bok choy. Eggplant. Tomatoes. Turnips. Cucumbers. Peppers. Radishes. Cauliflower. Zucchini. Fennel. Swiss chard. Grains Whole wheat bread. Bran cereal. Brown rice. Whole wheat pasta. Low GI cereals. Meats and other proteins Meat, poultry, and fish. Eggs. Egg substitutes. Nuts, seeds, lentils, and split peas in small amounts. Berniece Salines. Dairy Cheese in moderate amounts. Beverages Plain water. Sugar-free, caffeine-free beverages. Mineral water or club soda. Caffeine-free, carbohydrate-free herbal tea. Fats and oils Avocado. Cream. Sour cream. Cream cheese. Butter. Plant-based oils, such as olive, canola, and sunflower. Margarine. Mayonnaise. Sweets and desserts Any homemade sweets or desserts made using ketogenic diet recipes. The items listed above may not be a complete list of recommended foods and beverages. Contact a dietitian for more information. What foods should I avoid? Fruits Fruit juice. Fruits packed in syrups. Dried or candied fruits. Vegetables Corn. Potatoes. Peas. Grains All bread, dry cereals, and cooked cereals with added sugar. Baked goods. Crackers and pretzels. Meats and other proteins Meat, poultry, or fish prepared with flour or breading. Nut butters with added sugar.  Beans. Dairy Milk. Yogurt. Fats and oils Salad dressings with added sugar. Gravies. Beverages Sugar-sweetened teas, coffee drinks, or soft drinks. Juice. Sports drinks. Sweets and desserts All sweets and desserts, unless the dessert is homemade using ketogenic diet recipes. The items listed above may not be a complete list of foods and beverages to avoid. Contact a dietitian for more information. Summary  A ketogenic eating plan is a diet that is very low in carbohydrates, moderately low in protein, and very high in fat.  Aim for a daily meal and snack time schedule that you can follow consistently.  Buy a gram scale to weigh foods. This is necessary to follow this diet correctly and accurately. Your dietitian will teach you how to use one for this diet.  Work closely with your health care provider and a dietitian while you are following a ketogenic eating plan. This information is not intended to replace advice given to you by your health care provider. Make sure you discuss any questions you have with your health care provider. Document Revised: 02/21/2018 Document Reviewed: 02/21/2018 Elsevier Patient Education  [place].

## 2019-12-02 LAB — COMPREHENSIVE METABOLIC PANEL
ALT: 35 IU/L — ABNORMAL HIGH (ref 0–32)
AST: 25 IU/L (ref 0–40)
Albumin/Globulin Ratio: 2.2 (ref 1.2–2.2)
Albumin: 4.7 g/dL (ref 3.7–4.7)
Alkaline Phosphatase: 71 IU/L (ref 39–117)
BUN/Creatinine Ratio: 28 (ref 12–28)
BUN: 16 mg/dL (ref 8–27)
Bilirubin Total: 0.5 mg/dL (ref 0.0–1.2)
CO2: 24 mmol/L (ref 20–29)
Calcium: 10 mg/dL (ref 8.7–10.3)
Chloride: 100 mmol/L (ref 96–106)
Creatinine, Ser: 0.58 mg/dL (ref 0.57–1.00)
GFR calc Af Amer: 107 mL/min/{1.73_m2} (ref >59)
GFR calc non Af Amer: 93 mL/min/{1.73_m2} (ref >59)
Globulin, Total: 2.1 g/dL (ref 1.5–4.5)
Glucose: 110 mg/dL — ABNORMAL HIGH (ref 65–99)
Potassium: 4.7 mmol/L (ref 3.5–5.2)
Sodium: 139 mmol/L (ref 134–144)
Total Protein: 6.8 g/dL (ref 6.0–8.5)

## 2019-12-02 LAB — LIPID PANEL W/O CHOL/HDL RATIO
Cholesterol, Total: 210 mg/dL — ABNORMAL HIGH (ref 100–199)
HDL: 97 mg/dL (ref >39)
LDL Chol Calc (NIH): 102 mg/dL — ABNORMAL HIGH (ref 0–99)
Triglycerides: 59 mg/dL (ref 0–149)
VLDL Cholesterol Cal: 11 mg/dL (ref 5–40)

## 2019-12-02 LAB — CBC
Hematocrit: 45 % (ref 34.0–46.6)
Hemoglobin: 15.4 g/dL (ref 11.1–15.9)
MCH: 29.2 pg (ref 26.6–33.0)
MCHC: 34.2 g/dL (ref 31.5–35.7)
MCV: 85 fL (ref 79–97)
Platelets: 319 10*3/uL (ref 150–450)
RBC: 5.28 x10E6/uL (ref 3.77–5.28)
RDW: 12.9 % (ref 11.7–15.4)
WBC: 5.6 10*3/uL (ref 3.4–10.8)

## 2019-12-02 LAB — TSH: TSH: 3.8 u[IU]/mL (ref 0.450–4.500)

## 2019-12-02 LAB — T4, FREE: Free T4: 1.13 ng/dL (ref 0.82–1.77)

## 2019-12-02 LAB — VITAMIN D 25 HYDROXY (VIT D DEFICIENCY, FRACTURES): Vit D, 25-Hydroxy: 22.1 ng/mL — ABNORMAL LOW (ref 30.0–100.0)

## 2019-12-03 NOTE — Progress Notes (Signed)
Labs look good. Discuss with patient at visit 12/29/2019

## 2019-12-15 ENCOUNTER — Ambulatory Visit: Payer: Medicare HMO | Attending: Internal Medicine

## 2019-12-15 DIAGNOSIS — Z23 Encounter for immunization: Secondary | ICD-10-CM

## 2019-12-15 NOTE — Progress Notes (Signed)
   Covid-19 Vaccination Clinic  Name:  [name]    MRN: [id number] DOB: 1945-12-05  12/15/2019  [name] was observed post Covid-19 immunization for 15 minutes without incidence. She was provided with Vaccine Information Sheet and instruction to access the V-Safe system.   [name] was instructed to call 911 with any severe reactions post vaccine: Marland Kitchen Difficulty breathing  . Swelling of your face and throat  . A fast heartbeat  . A bad rash all over your body  . Dizziness and weakness    Immunizations Administered    Name Date Dose VIS Date Route   Pfizer COVID-19 Vaccine 12/15/2019  9:16 AM 0.3 mL 10/10/2019 Intramuscular   Manufacturer: [place]   Lot: [id number]   [place]: [id number]

## 2019-12-25 ENCOUNTER — Telehealth: Payer: Self-pay

## 2019-12-25 NOTE — Telephone Encounter (Signed)
NO VM WAS UNABLE TO SCREEN AND CONFIRM 12-29-19 OV.

## 2019-12-26 DIAGNOSIS — M1712 Unilateral primary osteoarthritis, left knee: Secondary | ICD-10-CM | POA: Insufficient documentation

## 2019-12-26 DIAGNOSIS — M25562 Pain in left knee: Secondary | ICD-10-CM | POA: Diagnosis not present

## 2019-12-26 DIAGNOSIS — M19012 Primary osteoarthritis, left shoulder: Secondary | ICD-10-CM | POA: Diagnosis not present

## 2019-12-26 DIAGNOSIS — M7652 Patellar tendinitis, left knee: Secondary | ICD-10-CM | POA: Diagnosis not present

## 2019-12-26 DIAGNOSIS — M1612 Unilateral primary osteoarthritis, left hip: Secondary | ICD-10-CM | POA: Diagnosis not present

## 2019-12-26 DIAGNOSIS — M7601 Gluteal tendinitis, right hip: Secondary | ICD-10-CM | POA: Diagnosis not present

## 2019-12-29 ENCOUNTER — Encounter: Payer: Self-pay | Admitting: Nurse Practitioner

## 2019-12-29 ENCOUNTER — Other Ambulatory Visit: Payer: Self-pay

## 2019-12-29 ENCOUNTER — Ambulatory Visit (INDEPENDENT_AMBULATORY_CARE_PROVIDER_SITE_OTHER): Payer: Medicare HMO | Admitting: Nurse Practitioner

## 2019-12-29 VITALS — BP 157/80 | HR 80 | Temp 97.6°F | Resp 16 | Ht 59.0 in | Wt 144.2 lb

## 2019-12-29 DIAGNOSIS — R41840 Attention and concentration deficit: Secondary | ICD-10-CM

## 2019-12-29 DIAGNOSIS — I10 Essential (primary) hypertension: Secondary | ICD-10-CM

## 2019-12-29 DIAGNOSIS — E782 Mixed hyperlipidemia: Secondary | ICD-10-CM | POA: Diagnosis not present

## 2019-12-29 MED ORDER — METHYLPHENIDATE HCL ER (OSM) 18 MG PO TBCR: 18.0000 mg | EXTENDED_RELEASE_TABLET | Freq: Every day | ORAL | 0 refills | Status: DC

## 2019-12-29 NOTE — Progress Notes (Signed)
[facility] [place], [place] [id number]  Internal MEDICINE  Office Visit Note  Patient Name: [name]  [id number]  [id number]  Date of Service: 12/29/2019  Chief Complaint  Patient presents with  . Follow-up    review labs and restarted concerta  . Hypertension    The patient is here for follow up visit. Was restarted on very low dose concerta daily. States this has helped her concentrate and stay focused on work and other tasks. She states it is very expensive. Had to pay nearly $100 for a 30 day prescription. She states that this medication is helpful and has been effective in the past. Blood pressure slighlly elevated today, however, it is generally well managed when she takes prescribed medication.       Current Medication: Outpatient Encounter Medications as of 12/29/2019  Medication Sig  . amLODipine (NORVASC) 5 MG tablet Take 1 tablet (5 mg total) by mouth daily.  Marland Kitchen aspirin EC 81 MG tablet Take 1 tablet (81 mg total) by mouth daily.  Marland Kitchen atorvastatin (LIPITOR) 20 MG tablet TAKE 1 TABLET EVERY DAY USUALLY IN THE EVENING  . esomeprazole (NEXIUM) 40 MG capsule   . fexofenadine-pseudoephedrine (ALLEGRA-D ALLERGY & CONGESTION) 180-240 MG 24 hr tablet Take 1 tablet by mouth daily.  . fluticasone (FLONASE) 50 MCG/ACT nasal spray USE 2 SPRAYS IN EACH NOSTRIL EVERY DAY  . ibuprofen (ADVIL) 100 MG tablet Take 100 mg by mouth every 6 (six) hours as needed for fever. Taking as needed  . methylphenidate (CONCERTA) 18 MG PO CR tablet Take 1 tablet (18 mg total) by mouth daily.  . nitrofurantoin, macrocrystal-monohydrate, (MACROBID) 100 MG capsule Take 1 capsule (100 mg total) by mouth 2 (two) times daily.  Marland Kitchen rOPINIRole (REQUIP) 2 MG tablet TAKE 1 TABLET BY MOUTH TWICE A DAY AS NEEDED  . [DISCONTINUED] methylphenidate (CONCERTA) 18 MG PO CR tablet Take 1 tablet (18 mg total) by mouth daily.  . [DISCONTINUED] methylphenidate (CONCERTA) 18 MG PO CR tablet Take 1  tablet (18 mg total) by mouth daily.  . [DISCONTINUED] methylphenidate (CONCERTA) 18 MG PO CR tablet Take 1 tablet (18 mg total) by mouth daily.   No facility-administered encounter medications on file as of 12/29/2019.    Surgical History: Past Surgical History:  Procedure Laterality Date  . COLONOSCOPY WITH PROPOFOL N/A 06/18/2017   Procedure: COLONOSCOPY WITH PROPOFOL;  Surgeon: Lucilla Lame, MD;  Location: [facility];  Service: Endoscopy;  Laterality: N/A;  . ESOPHAGOGASTRODUODENOSCOPY (EGD) WITH PROPOFOL N/A 06/18/2017   Procedure: ESOPHAGOGASTRODUODENOSCOPY (EGD) WITH PROPOFOL;  Surgeon: Lucilla Lame, MD;  Location: [facility];  Service: Endoscopy;  Laterality: N/A;  . ESOPHAGOGASTRODUODENOSCOPY (EGD) WITH PROPOFOL N/A 10/05/2017   Procedure: ESOPHAGOGASTRODUODENOSCOPY (EGD) WITH PROPOFOL;  Surgeon: Lucilla Lame, MD;  Location: [facility];  Service: Endoscopy;  Laterality: N/A;  . POLYPECTOMY  06/18/2017   Procedure: POLYPECTOMY;  Surgeon: Lucilla Lame, MD;  Location: [facility];  Service: Endoscopy;;  . TUBAL LIGATION  1980    Medical History: Past Medical History:  Diagnosis Date  . Asthma   . H/O exercise stress test    at 51 years age  . Heart murmur   . Hyperlipidemia   . Hypertension   . Jaundice    In High school  . Jaundice    hx     Family History: Family History  Problem Relation Age of Onset  . Heart attack Mother   . Heart disease Mother 62  .  Heart attack Father   . Heart disease Father 94  . Diabetes Sister   . Heart disease Brother   . Heart disease Brother   . Diabetes Maternal Grandmother   . Breast cancer Other   . Colon cancer Neg Hx     Social History   Socioeconomic History  . Marital status: Married    Spouse name: Not on file  . Number of children: 3  . Years of education: Not on file  . Highest education level: Not on file  Occupational History  . Occupation: Self Employed - Hairdresser  Tobacco  Use  . Smoking status: Never Smoker  . Smokeless tobacco: Never Used  Substance and Sexual Activity  . Alcohol use: Yes    Alcohol/week: 7.0 standard drinks    Types: 7 Glasses of wine per week    Comment: Wine nightly  . Drug use: No  . Sexual activity: Not on file  Other Topics Concern  . Not on file  Social History Narrative   Lives in [place]. Works at [employer] in [employer]      Daily Caffeine Use:  2 coffee   Regular Exercise -  NO            Social Determinants of Health   Financial Resource Strain:   . Difficulty of Paying Living Expenses: Not on file  Food Insecurity:   . Worried About Charity fundraiser in the Last Year: Not on file  . Ran Out of Food in the Last Year: Not on file  Transportation Needs:   . Lack of Transportation (Medical): Not on file  . Lack of Transportation (Non-Medical): Not on file  Physical Activity:   . Days of Exercise per Week: Not on file  . Minutes of Exercise per Session: Not on file  Stress:   . Feeling of Stress : Not on file  Social Connections:   . Frequency of Communication with Friends and Family: Not on file  . Frequency of Social Gatherings with Friends and Family: Not on file  . Attends Religious Services: Not on file  . Active Member of Clubs or Organizations: Not on file  . Attends Archivist Meetings: Not on file  . Marital Status: Not on file  Intimate Partner Violence:   . Fear of Current or Ex-Partner: Not on file  . Emotionally Abused: Not on file  . Physically Abused: Not on file  . Sexually Abused: Not on file      Review of Systems  Constitutional: Negative for activity change, chills, fatigue and unexpected weight change.  HENT: Negative for congestion, postnasal drip, rhinorrhea, sneezing and sore throat.   Respiratory: Negative for cough, chest tightness, shortness of breath and wheezing.   Cardiovascular: Negative for chest pain and palpitations.       Blood pressure slightly elevated  today.   Gastrointestinal: Negative for abdominal pain, constipation, diarrhea, nausea and vomiting.  Endocrine: Negative for cold intolerance, heat intolerance, polydipsia and polyuria.  Musculoskeletal: Negative for arthralgias, back pain, joint swelling and neck pain.  Skin: Negative for rash.  Allergic/Immunologic: Negative for environmental allergies.  Neurological: Negative for dizziness, tremors, numbness and headaches.  Hematological: Negative for adenopathy. Does not bruise/bleed easily.  Psychiatric/Behavioral: Positive for decreased concentration. Negative for behavioral problems (Depression), sleep disturbance and suicidal ideas. The patient is not nervous/anxious.        Concentration and attention improved with low dose concerta    Today's Vitals   12/29/19 1133  BP: (!) 157/80  Pulse: 80  Resp: 16  Temp: 97.6 F (36.4 C)  SpO2: 99%  Weight: 144 lb 3.2 oz (65.4 kg)  Height: 4\' 11"  (1.499 m)   Body mass index is 29.12 kg/m.  Physical Exam Vitals and nursing note reviewed.  Constitutional:      General: She is not in acute distress.    Appearance: Normal appearance. She is well-developed. She is not diaphoretic.  HENT:     Head: Normocephalic and atraumatic.     Nose: Nose normal.     Mouth/Throat:     Pharynx: No oropharyngeal exudate.  Eyes:     Pupils: Pupils are equal, round, and reactive to light.  Neck:     Thyroid: No thyromegaly.     Vascular: No carotid bruit or JVD.     Trachea: No tracheal deviation.  Cardiovascular:     Rate and Rhythm: Normal rate and regular rhythm.     Heart sounds: Normal heart sounds. No murmur. No friction rub. No gallop.   Pulmonary:     Effort: Pulmonary effort is normal. No respiratory distress.     Breath sounds: Normal breath sounds. No wheezing or rales.  Chest:     Chest wall: No tenderness.  Abdominal:     Palpations: Abdomen is soft.  Musculoskeletal:        General: Normal range of motion.     Cervical back:  Normal range of motion and neck supple.  Lymphadenopathy:     Cervical: No cervical adenopathy.  Skin:    General: Skin is warm and dry.  Neurological:     General: No focal deficit present.     Mental Status: She is alert and oriented to person, place, and time.     Cranial Nerves: No cranial nerve deficit.  Psychiatric:        Attention and Perception: Attention and perception normal.        Mood and Affect: Affect normal. Mood is anxious.        Speech: Speech normal.        Behavior: Behavior normal.        Thought Content: Thought content normal.        Cognition and Memory: Cognition and memory normal.        Judgment: Judgment normal.     Assessment/Plan: 1. Essential hypertension Blood pressure stable. Continue meds as prescribed   2. Concentration deficit May continue to take concerta 18mg  daily when needed. Sent three 30 day prescriptions to her pharmacy. Dates are 12/29/2019, 01/27/2020, and 02/25/2020. She will contact the office if copay for this remains too expensive. Will discuss alternatives at that time.  - methylphenidate (CONCERTA) 18 MG PO CR tablet; Take 1 tablet (18 mg total) by mouth daily.  Dispense: 30 tablet; Refill: 0  3. Mixed hyperlipidemia Continue atorvastatin as prescribed   General Counseling: Biddie verbalizes understanding of the findings of todays visit and agrees with plan of treatment. I have discussed any further diagnostic evaluation that may be needed or ordered today. We also reviewed her medications today. she has been encouraged to call the office with any questions or concerns that should arise related to todays visit.   Refilled Controlled medications today. Reviewed risks and possible side effects associated with taking Stimulants. Combination of these drugs with other psychotropic medications could cause dizziness and drowsiness. Pt needs to Monitor symptoms and exercise caution in driving and operating heavy machinery to avoid damages to  oneself, to  others and to the surroundings. Patient verbalized understanding in this matter. Dependence and abuse for these drugs will be monitored closely. A Controlled substance policy and procedure is on file which allows [facility] to order a urine drug screen test at any visit. Patient understands and agrees with the plan..  This patient was seen by Leretha Pol FNP Collaboration with Dr Lavera Guise as a part of collaborative care agreement  Meds ordered this encounter  Medications  . DISCONTD: methylphenidate (CONCERTA) 18 MG PO CR tablet    Sig: Take 1 tablet (18 mg total) by mouth daily.    Dispense:  30 tablet    Refill:  0    Order Specific Question:   Supervising Provider    Answer:   Lavera Guise [id number]  . DISCONTD: methylphenidate (CONCERTA) 18 MG PO CR tablet    Sig: Take 1 tablet (18 mg total) by mouth daily.    Dispense:  30 tablet    Refill:  0    Fill after 01/27/2020    Order Specific Question:   Supervising Provider    Answer:   Lavera Guise [id number]  . methylphenidate (CONCERTA) 18 MG PO CR tablet    Sig: Take 1 tablet (18 mg total) by mouth daily.    Dispense:  30 tablet    Refill:  0    Fill after 02/25/2020    Order Specific Question:   Supervising Provider    Answer:   Lavera Guise [id number]    Total time spent: 20 Minutes   Time spent includes review of chart, medications, test results, and follow up plan with the patient.      Dr Lavera Guise Internal medicine

## 2020-01-08 DIAGNOSIS — M19012 Primary osteoarthritis, left shoulder: Secondary | ICD-10-CM | POA: Diagnosis not present

## 2020-01-09 ENCOUNTER — Other Ambulatory Visit: Payer: Self-pay

## 2020-01-09 DIAGNOSIS — R41840 Attention and concentration deficit: Secondary | ICD-10-CM

## 2020-01-14 MED ORDER — CONCERTA 18 MG PO TBCR: 18.0000 mg | EXTENDED_RELEASE_TABLET | Freq: Every day | ORAL | 0 refills | Status: DC

## 2020-01-14 NOTE — Telephone Encounter (Signed)
Fill after 01/31/2020 - please fill as name brand Concerta.

## 2020-01-19 DIAGNOSIS — M79605 Pain in left leg: Secondary | ICD-10-CM | POA: Diagnosis not present

## 2020-01-19 DIAGNOSIS — M25562 Pain in left knee: Secondary | ICD-10-CM | POA: Diagnosis not present

## 2020-01-19 DIAGNOSIS — M1612 Unilateral primary osteoarthritis, left hip: Secondary | ICD-10-CM | POA: Diagnosis not present

## 2020-01-19 DIAGNOSIS — M899 Disorder of bone, unspecified: Secondary | ICD-10-CM | POA: Diagnosis not present

## 2020-01-20 DIAGNOSIS — M1712 Unilateral primary osteoarthritis, left knee: Secondary | ICD-10-CM | POA: Diagnosis not present

## 2020-01-20 DIAGNOSIS — M7652 Patellar tendinitis, left knee: Secondary | ICD-10-CM | POA: Diagnosis not present

## 2020-01-20 DIAGNOSIS — M7601 Gluteal tendinitis, right hip: Secondary | ICD-10-CM | POA: Diagnosis not present

## 2020-01-20 DIAGNOSIS — M1612 Unilateral primary osteoarthritis, left hip: Secondary | ICD-10-CM | POA: Diagnosis not present

## 2020-01-20 DIAGNOSIS — M898X9 Other specified disorders of bone, unspecified site: Secondary | ICD-10-CM | POA: Diagnosis not present

## 2020-02-10 DIAGNOSIS — D481 Neoplasm of uncertain behavior of connective and other soft tissue: Secondary | ICD-10-CM | POA: Diagnosis not present

## 2020-02-16 ENCOUNTER — Other Ambulatory Visit: Payer: Self-pay

## 2020-02-16 DIAGNOSIS — G2581 Restless legs syndrome: Secondary | ICD-10-CM | POA: Diagnosis not present

## 2020-02-16 DIAGNOSIS — E785 Hyperlipidemia, unspecified: Secondary | ICD-10-CM | POA: Diagnosis not present

## 2020-02-16 DIAGNOSIS — Z7722 Contact with and (suspected) exposure to environmental tobacco smoke (acute) (chronic): Secondary | ICD-10-CM | POA: Diagnosis not present

## 2020-02-16 DIAGNOSIS — M810 Age-related osteoporosis without current pathological fracture: Secondary | ICD-10-CM | POA: Diagnosis not present

## 2020-02-16 DIAGNOSIS — I10 Essential (primary) hypertension: Secondary | ICD-10-CM | POA: Diagnosis not present

## 2020-02-16 DIAGNOSIS — G8929 Other chronic pain: Secondary | ICD-10-CM | POA: Diagnosis not present

## 2020-02-16 DIAGNOSIS — M1990 Unspecified osteoarthritis, unspecified site: Secondary | ICD-10-CM | POA: Diagnosis not present

## 2020-02-16 MED ORDER — ROPINIROLE HCL 2 MG PO TABS: 2.0000 mg | ORAL_TABLET | Freq: Two times a day (BID) | ORAL | 1 refills | Status: DC | PRN

## 2020-03-01 DIAGNOSIS — L821 Other seborrheic keratosis: Secondary | ICD-10-CM | POA: Diagnosis not present

## 2020-03-01 DIAGNOSIS — D2261 Melanocytic nevi of right upper limb, including shoulder: Secondary | ICD-10-CM | POA: Diagnosis not present

## 2020-03-01 DIAGNOSIS — D2262 Melanocytic nevi of left upper limb, including shoulder: Secondary | ICD-10-CM | POA: Diagnosis not present

## 2020-03-01 DIAGNOSIS — D225 Melanocytic nevi of trunk: Secondary | ICD-10-CM | POA: Diagnosis not present

## 2020-03-01 DIAGNOSIS — D485 Neoplasm of uncertain behavior of skin: Secondary | ICD-10-CM | POA: Diagnosis not present

## 2020-03-01 DIAGNOSIS — D2272 Melanocytic nevi of left lower limb, including hip: Secondary | ICD-10-CM | POA: Diagnosis not present

## 2020-03-01 DIAGNOSIS — C44619 Basal cell carcinoma of skin of left upper limb, including shoulder: Secondary | ICD-10-CM | POA: Diagnosis not present

## 2020-03-01 DIAGNOSIS — D2271 Melanocytic nevi of right lower limb, including hip: Secondary | ICD-10-CM | POA: Diagnosis not present

## 2020-03-01 DIAGNOSIS — C44519 Basal cell carcinoma of skin of other part of trunk: Secondary | ICD-10-CM | POA: Diagnosis not present

## 2020-03-16 DIAGNOSIS — E663 Overweight: Secondary | ICD-10-CM | POA: Diagnosis not present

## 2020-04-12 ENCOUNTER — Ambulatory Visit: Payer: Medicare HMO | Admitting: Nurse Practitioner

## 2020-04-12 DIAGNOSIS — C44519 Basal cell carcinoma of skin of other part of trunk: Secondary | ICD-10-CM | POA: Diagnosis not present

## 2020-04-12 DIAGNOSIS — C44619 Basal cell carcinoma of skin of left upper limb, including shoulder: Secondary | ICD-10-CM | POA: Diagnosis not present

## 2020-04-20 DIAGNOSIS — D480 Neoplasm of uncertain behavior of bone and articular cartilage: Secondary | ICD-10-CM | POA: Diagnosis not present

## 2020-04-20 DIAGNOSIS — M25552 Pain in left hip: Secondary | ICD-10-CM | POA: Diagnosis not present

## 2020-04-22 ENCOUNTER — Telehealth: Payer: Self-pay

## 2020-04-22 NOTE — Telephone Encounter (Signed)
Confirmed and screened for 04-26-20 ov. 

## 2020-04-26 ENCOUNTER — Ambulatory Visit (INDEPENDENT_AMBULATORY_CARE_PROVIDER_SITE_OTHER): Payer: Medicare HMO | Admitting: Nurse Practitioner

## 2020-04-26 ENCOUNTER — Ambulatory Visit: Payer: Medicare HMO | Admitting: Nurse Practitioner

## 2020-04-26 ENCOUNTER — Other Ambulatory Visit: Payer: Self-pay

## 2020-04-26 ENCOUNTER — Encounter: Payer: Self-pay | Admitting: Nurse Practitioner

## 2020-04-26 VITALS — BP 144/59 | HR 82 | Temp 97.3°F | Resp 16 | Ht 59.0 in | Wt 140.8 lb

## 2020-04-26 DIAGNOSIS — G2581 Restless legs syndrome: Secondary | ICD-10-CM | POA: Diagnosis not present

## 2020-04-26 DIAGNOSIS — E782 Mixed hyperlipidemia: Secondary | ICD-10-CM | POA: Diagnosis not present

## 2020-04-26 DIAGNOSIS — Z1231 Encounter for screening mammogram for malignant neoplasm of breast: Secondary | ICD-10-CM | POA: Diagnosis not present

## 2020-04-26 DIAGNOSIS — I10 Essential (primary) hypertension: Secondary | ICD-10-CM | POA: Diagnosis not present

## 2020-04-26 MED ORDER — ROPINIROLE HCL 3 MG PO TABS: 3.0000 mg | ORAL_TABLET | Freq: Three times a day (TID) | ORAL | 3 refills | Status: DC | PRN

## 2020-04-26 NOTE — Progress Notes (Signed)
[facility] [name], [name] [id number]  Internal MEDICINE  Office Visit Note  Patient Name: [name]  [id number]  [id number]  Date of Service: 05/03/2020  Chief Complaint  Patient presents with  . Follow-up  . Hyperlipidemia  . Hypertension  . Rash    on pts chest  . Asthma    The patient is here for routine follow up visit. Concern regarding moderately restless and jittery legs. Currently takes ropinirole 2mg . It is prescribed as once daily. She states that she takes it at least two to three times per day. Stopped taking concerta. States that it was not helping with concentration issues or with fidgetiness of her legs. It was also very expensive. She is due to have a screening mammogram. She states that she also got a letter stating it was time for her to have colonoscopy/endoscopy with dr. Allen Norris. Her last colonoscopy was in 2018 and she did have multiple polyps removed which were adenomas. Endoscopy did show squamous mucosa as well as leukoplakia.       Current Medication: Outpatient Encounter Medications as of 04/26/2020  Medication Sig  . amLODipine (NORVASC) 5 MG tablet Take 1 tablet (5 mg total) by mouth daily.  Marland Kitchen aspirin EC 81 MG tablet Take 1 tablet (81 mg total) by mouth daily.  Marland Kitchen atorvastatin (LIPITOR) 20 MG tablet TAKE 1 TABLET EVERY DAY USUALLY IN THE EVENING  . esomeprazole (NEXIUM) 40 MG capsule   . fexofenadine-pseudoephedrine (ALLEGRA-D ALLERGY & CONGESTION) 180-240 MG 24 hr tablet Take 1 tablet by mouth daily.  . fluticasone (FLONASE) 50 MCG/ACT nasal spray USE 2 SPRAYS IN EACH NOSTRIL EVERY DAY  . ibuprofen (ADVIL) 100 MG tablet Take 100 mg by mouth every 6 (six) hours as needed for fever. Taking as needed  . rOPINIRole (REQUIP) 3 MG tablet Take 1 tablet (3 mg total) by mouth 3 (three) times daily as needed.  . [DISCONTINUED] CONCERTA 18 MG CR tablet Take 1 tablet (18 mg total) by mouth daily.  . [DISCONTINUED] nitrofurantoin,  macrocrystal-monohydrate, (MACROBID) 100 MG capsule Take 1 capsule (100 mg total) by mouth 2 (two) times daily.  . [DISCONTINUED] rOPINIRole (REQUIP) 2 MG tablet Take 1 tablet (2 mg total) by mouth 2 (two) times daily as needed.   No facility-administered encounter medications on file as of 04/26/2020.    Surgical History: Past Surgical History:  Procedure Laterality Date  . COLONOSCOPY WITH PROPOFOL N/A 06/18/2017   Procedure: COLONOSCOPY WITH PROPOFOL;  Surgeon: Lucilla Lame, MD;  Location: [place];  Service: Endoscopy;  Laterality: N/A;  . ESOPHAGOGASTRODUODENOSCOPY (EGD) WITH PROPOFOL N/A 06/18/2017   Procedure: ESOPHAGOGASTRODUODENOSCOPY (EGD) WITH PROPOFOL;  Surgeon: Lucilla Lame, MD;  Location: [place];  Service: Endoscopy;  Laterality: N/A;  . ESOPHAGOGASTRODUODENOSCOPY (EGD) WITH PROPOFOL N/A 10/05/2017   Procedure: ESOPHAGOGASTRODUODENOSCOPY (EGD) WITH PROPOFOL;  Surgeon: Lucilla Lame, MD;  Location: [place];  Service: Endoscopy;  Laterality: N/A;  . POLYPECTOMY  06/18/2017   Procedure: POLYPECTOMY;  Surgeon: Lucilla Lame, MD;  Location: [place];  Service: Endoscopy;;  . TUBAL LIGATION  1980    Medical History: Past Medical History:  Diagnosis Date  . Asthma   . H/O exercise stress test    at 76 years age  . Heart murmur   . Hyperlipidemia   . Hypertension   . Jaundice    In High school  . Jaundice    hx     Family History: Family History  Problem  Relation Age of Onset  . Heart attack Mother   . Heart disease Mother 46  . Heart attack Father   . Heart disease Father 65  . Diabetes Sister   . Heart disease Brother   . Heart disease Brother   . Diabetes Maternal Grandmother   . Breast cancer Other   . Colon cancer Neg Hx     Social History   Socioeconomic History  . Marital status: Married    Spouse name: Not on file  . Number of children: 3  . Years of education: Not on file  . Highest education level: Not on  file  Occupational History  . Occupation: Self Employed - Hairdresser  Tobacco Use  . Smoking status: Never Smoker  . Smokeless tobacco: Never Used  Vaping Use  . Vaping Use: Never used  Substance and Sexual Activity  . Alcohol use: Yes    Alcohol/week: 7.0 standard drinks    Types: 7 Glasses of wine per week    Comment: Wine nightly  . Drug use: No  . Sexual activity: Not on file  Other Topics Concern  . Not on file  Social History Narrative   Lives in [place]. Works at [employer] in [employer]      Daily Caffeine Use:  2 coffee   Regular Exercise -  NO            Social Determinants of Radio broadcast assistant Strain:   . Difficulty of Paying Living Expenses:   Food Insecurity:   . Worried About Charity fundraiser in the Last Year:   . Arboriculturist in the Last Year:   Transportation Needs:   . Film/video editor (Medical):   Marland Kitchen Lack of Transportation (Non-Medical):   Physical Activity:   . Days of Exercise per Week:   . Minutes of Exercise per Session:   Stress:   . Feeling of Stress :   Social Connections:   . Frequency of Communication with Friends and Family:   . Frequency of Social Gatherings with Friends and Family:   . Attends Religious Services:   . Active Member of Clubs or Organizations:   . Attends Archivist Meetings:   Marland Kitchen Marital Status:   Intimate Partner Violence:   . Fear of Current or Ex-Partner:   . Emotionally Abused:   Marland Kitchen Physically Abused:   . Sexually Abused:       Review of Systems  Constitutional: Negative for activity change, chills, fatigue and unexpected weight change.  HENT: Negative for congestion, postnasal drip, rhinorrhea, sneezing and sore throat.   Respiratory: Negative for cough, chest tightness, shortness of breath and wheezing.   Cardiovascular: Negative for chest pain and palpitations.  Gastrointestinal: Negative for abdominal pain, constipation, diarrhea, nausea and vomiting.  Endocrine: Negative for  cold intolerance, heat intolerance, polydipsia and polyuria.  Musculoskeletal: Negative for arthralgias, back pain, joint swelling, neck pain and neck stiffness.       Increased and worsening restless legs.   Skin: Negative for rash.  Allergic/Immunologic: Negative for environmental allergies.  Neurological: Negative for dizziness, tremors, numbness and headaches.  Hematological: Negative for adenopathy. Does not bruise/bleed easily.  Psychiatric/Behavioral: Positive for decreased concentration. Negative for behavioral problems (Depression), sleep disturbance and suicidal ideas. The patient is not nervous/anxious.        Will not continue with treatment of Concerta as it is way too expensive and she did not feel as though it were helping too  much.     Today's Vitals   04/26/20 1421  BP: (!) 144/59  Pulse: 82  Resp: 16  Temp: (!) 97.3 F (36.3 C)  SpO2: 96%  Weight: 140 lb 12.8 oz (63.9 kg)  Height: 4\' 11"  (1.499 m)   Body mass index is 28.44 kg/m.  Physical Exam Vitals and nursing note reviewed.  Constitutional:      General: She is not in acute distress.    Appearance: Normal appearance. She is well-developed. She is not diaphoretic.  HENT:     Head: Normocephalic and atraumatic.     Nose: Nose normal.     Mouth/Throat:     Pharynx: No oropharyngeal exudate.  Eyes:     Pupils: Pupils are equal, round, and reactive to light.  Neck:     Thyroid: No thyromegaly.     Vascular: No carotid bruit or JVD.     Trachea: No tracheal deviation.  Cardiovascular:     Rate and Rhythm: Normal rate and regular rhythm.     Heart sounds: Normal heart sounds. No murmur heard.  No friction rub. No gallop.   Pulmonary:     Effort: Pulmonary effort is normal. No respiratory distress.     Breath sounds: Normal breath sounds. No wheezing or rales.  Chest:     Chest wall: No tenderness.  Abdominal:     Palpations: Abdomen is soft.  Musculoskeletal:        General: Normal range of motion.      Cervical back: Normal range of motion and neck supple.  Lymphadenopathy:     Cervical: No cervical adenopathy.  Skin:    General: Skin is warm and dry.  Neurological:     General: No focal deficit present.     Mental Status: She is alert and oriented to person, place, and time.     Cranial Nerves: No cranial nerve deficit.  Psychiatric:        Attention and Perception: Attention and perception normal.        Mood and Affect: Affect normal. Mood is anxious.        Speech: Speech normal.        Behavior: Behavior normal.        Thought Content: Thought content normal.        Cognition and Memory: Cognition and memory normal.        Judgment: Judgment normal.    Assessment/Plan: 1. Essential hypertension Generally stable. Continue bp medication as prescribed.   2. Mixed hyperlipidemia Continue with atorvastatin as prescribed   3. Restless leg syndrome Increase dose requip to 3mg . May slowly increase dose up to three times daily as needed. Reassess at next visit.  - rOPINIRole (REQUIP) 3 MG tablet; Take 1 tablet (3 mg total) by mouth 3 (three) times daily as needed.  Dispense: 90 tablet; Refill: 3  4. Encounter for screening mammogram for malignant neoplasm of breast - MM DIGITAL SCREENING BILATERAL; Future  General Counseling: lasheena frieze understanding of the findings of todays visit and agrees with plan of treatment. I have discussed any further diagnostic evaluation that may be needed or ordered today. We also reviewed her medications today. she has been encouraged to call the office with any questions or concerns that should arise related to todays visit.  This patient was seen by Leretha Pol FNP Collaboration with Dr Lavera Guise as a part of collaborative care agreement  Orders Placed This Encounter  Procedures  . MM DIGITAL SCREENING BILATERAL  Meds ordered this encounter  Medications  . rOPINIRole (REQUIP) 3 MG tablet    Sig: Take 1 tablet (3 mg total)  by mouth 3 (three) times daily as needed.    Dispense:  90 tablet    Refill:  3    Please note increased dose    Order Specific Question:   Supervising Provider    Answer:   Lavera Guise [[id number]]    Total time spent: 30 Minutes   Time spent includes review of chart, medications, test results, and follow up plan with the patient.      Dr Lavera Guise Internal medicine

## 2020-04-30 DIAGNOSIS — E663 Overweight: Secondary | ICD-10-CM | POA: Diagnosis not present

## 2020-05-03 DIAGNOSIS — Z1231 Encounter for screening mammogram for malignant neoplasm of breast: Secondary | ICD-10-CM | POA: Insufficient documentation

## 2020-05-04 ENCOUNTER — Other Ambulatory Visit: Payer: Self-pay

## 2020-05-04 MED ORDER — FLUTICASONE PROPIONATE 50 MCG/ACT NA SUSP: 2.0000 | Freq: Every day | NASAL | 5 refills | Status: DC

## 2020-05-05 ENCOUNTER — Other Ambulatory Visit: Payer: Self-pay

## 2020-05-10 ENCOUNTER — Other Ambulatory Visit: Payer: Self-pay

## 2020-05-10 DIAGNOSIS — G2581 Restless legs syndrome: Secondary | ICD-10-CM

## 2020-05-10 MED ORDER — ROPINIROLE HCL 3 MG PO TABS: 3.0000 mg | ORAL_TABLET | Freq: Three times a day (TID) | ORAL | 3 refills | Status: AC | PRN

## 2020-05-19 DIAGNOSIS — J0100 Acute maxillary sinusitis, unspecified: Secondary | ICD-10-CM | POA: Diagnosis not present

## 2020-05-31 DIAGNOSIS — H524 Presbyopia: Secondary | ICD-10-CM | POA: Diagnosis not present

## 2020-06-25 DIAGNOSIS — N950 Postmenopausal bleeding: Secondary | ICD-10-CM | POA: Diagnosis not present

## 2020-06-25 DIAGNOSIS — N812 Incomplete uterovaginal prolapse: Secondary | ICD-10-CM | POA: Diagnosis not present

## 2020-06-25 DIAGNOSIS — N3941 Urge incontinence: Secondary | ICD-10-CM | POA: Diagnosis not present

## 2020-06-25 DIAGNOSIS — N952 Postmenopausal atrophic vaginitis: Secondary | ICD-10-CM | POA: Diagnosis not present

## 2020-07-02 ENCOUNTER — Ambulatory Visit
Admission: RE | Admit: 2020-07-02 | Discharge: 2020-07-02 | Disposition: A | Discharge status: Home / Self Care | Payer: Medicare HMO | Source: Ambulatory Visit | Attending: Nurse Practitioner | Admitting: Nurse Practitioner

## 2020-07-02 ENCOUNTER — Other Ambulatory Visit: Payer: Self-pay

## 2020-07-02 DIAGNOSIS — Z1231 Encounter for screening mammogram for malignant neoplasm of breast: Secondary | ICD-10-CM

## 2020-07-09 DIAGNOSIS — N950 Postmenopausal bleeding: Secondary | ICD-10-CM | POA: Diagnosis not present

## 2020-07-09 DIAGNOSIS — N858 Other specified noninflammatory disorders of uterus: Secondary | ICD-10-CM | POA: Diagnosis not present

## 2020-07-13 DIAGNOSIS — E663 Overweight: Secondary | ICD-10-CM | POA: Diagnosis not present

## 2020-07-16 DIAGNOSIS — R3915 Urgency of urination: Secondary | ICD-10-CM | POA: Diagnosis not present

## 2020-07-16 DIAGNOSIS — N812 Incomplete uterovaginal prolapse: Secondary | ICD-10-CM | POA: Diagnosis not present

## 2020-07-19 DIAGNOSIS — N812 Incomplete uterovaginal prolapse: Secondary | ICD-10-CM | POA: Diagnosis not present

## 2020-07-19 DIAGNOSIS — N952 Postmenopausal atrophic vaginitis: Secondary | ICD-10-CM | POA: Diagnosis not present

## 2020-07-19 DIAGNOSIS — N3941 Urge incontinence: Secondary | ICD-10-CM | POA: Diagnosis not present

## 2020-08-02 DIAGNOSIS — E7800 Pure hypercholesterolemia, unspecified: Secondary | ICD-10-CM | POA: Diagnosis not present

## 2020-08-02 DIAGNOSIS — N814 Uterovaginal prolapse, unspecified: Secondary | ICD-10-CM | POA: Diagnosis not present

## 2020-08-02 DIAGNOSIS — I10 Essential (primary) hypertension: Secondary | ICD-10-CM | POA: Diagnosis not present

## 2020-08-02 DIAGNOSIS — M19012 Primary osteoarthritis, left shoulder: Secondary | ICD-10-CM | POA: Diagnosis not present

## 2020-08-02 DIAGNOSIS — M25512 Pain in left shoulder: Secondary | ICD-10-CM | POA: Diagnosis not present

## 2020-08-02 DIAGNOSIS — G8929 Other chronic pain: Secondary | ICD-10-CM | POA: Diagnosis not present

## 2020-08-02 DIAGNOSIS — Z23 Encounter for immunization: Secondary | ICD-10-CM | POA: Diagnosis not present

## 2020-08-03 DIAGNOSIS — I10 Essential (primary) hypertension: Secondary | ICD-10-CM | POA: Diagnosis not present

## 2020-08-03 DIAGNOSIS — E7800 Pure hypercholesterolemia, unspecified: Secondary | ICD-10-CM | POA: Diagnosis not present

## 2020-08-05 ENCOUNTER — Telehealth: Payer: Self-pay

## 2020-08-05 NOTE — Telephone Encounter (Signed)
Returned patient's call. Patient wanted to make sure her scheduled visit was a telephone. Informed patient that the appointment is a telephone visit. Pt verbalized understanding.

## 2020-08-08 NOTE — Progress Notes (Signed)
Negative mammogram

## 2020-08-09 ENCOUNTER — Other Ambulatory Visit: Payer: Self-pay

## 2020-08-09 ENCOUNTER — Telehealth (INDEPENDENT_AMBULATORY_CARE_PROVIDER_SITE_OTHER): Payer: Self-pay | Admitting: Gastroenterology

## 2020-08-09 DIAGNOSIS — Z86010 Personal history of colonic polyps: Secondary | ICD-10-CM

## 2020-08-09 MED ORDER — NA SULFATE-K SULFATE-MG SULF 17.5-3.13-1.6 GM/177ML PO SOLN: 1.0000 | Freq: Once | ORAL | 0 refills | Status: AC

## 2020-08-09 NOTE — Progress Notes (Signed)
Gastroenterology Pre-Procedure Review  Request Date: Monday 08/30/20 Requesting Physician: Dr. Allen Norris  PATIENT REVIEW QUESTIONS: The patient responded to the following health history questions as indicated:    1. Are you having any GI issues? no 2. Do you have a personal history of Polyps? yes (2018 Colonoscopy report noted colon polyps) 3. Do you have a family history of Colon Cancer or Polyps? no 4. Diabetes Mellitus? no 5. Joint replacements in the past 12 months?no 6. Major health problems in the past 3 months?no 7. Any artificial heart valves, MVP, or defibrillator?no    MEDICATIONS & ALLERGIES:    Patient reports the following regarding taking any anticoagulation/antiplatelet therapy:   Plavix, Coumadin, Eliquis, Xarelto, Lovenox, Pradaxa, Brilinta, or Effient? no Aspirin? yes (81 mg daily)  Patient confirms/reports the following medications:  Current Outpatient Medications  Medication Sig Dispense Refill   amLODipine (NORVASC) 5 MG tablet Take 1 tablet (5 mg total) by mouth daily. 90 tablet 3   aspirin EC 81 MG tablet Take 1 tablet (81 mg total) by mouth daily. 90 tablet 3   atorvastatin (LIPITOR) 20 MG tablet TAKE 1 TABLET EVERY DAY USUALLY IN THE EVENING 90 tablet 3   esomeprazole (NEXIUM) 40 MG capsule      estradiol (ESTRACE) 0.1 MG/GM vaginal cream Insert 1 gram into vagina every day for 1 week, then use only two to three times a week (such as Monday, Wednesday, and Friday)     fexofenadine-pseudoephedrine (ALLEGRA-D ALLERGY & CONGESTION) 180-240 MG 24 hr tablet Take 1 tablet by mouth daily. 30 tablet 5   fluticasone (FLONASE) 50 MCG/ACT nasal spray Place 2 sprays into both nostrils daily. 48 g 5   ibuprofen (ADVIL) 100 MG tablet Take 100 mg by mouth every 6 (six) hours as needed for fever. Taking as needed     rOPINIRole (REQUIP) 3 MG tablet Take 1 tablet (3 mg total) by mouth 3 (three) times daily as needed. 90 tablet 3   No current facility-administered  medications for this visit.    Patient confirms/reports the following allergies:  Allergies  Allergen Reactions   Codeine Nausea Only   Penicillins Swelling and Rash    No orders of the defined types were placed in this encounter.   AUTHORIZATION INFORMATION Primary Insurance: 1D#: Group #:  Secondary Insurance: 1D#: Group #:  SCHEDULE INFORMATION: Date: 08/30/20 Time: Location:[facility]

## 2020-08-13 ENCOUNTER — Other Ambulatory Visit: Payer: Self-pay | Admitting: Cardiovascular Disease

## 2020-08-16 ENCOUNTER — Other Ambulatory Visit: Payer: Self-pay

## 2020-08-16 NOTE — Progress Notes (Signed)
Rescheduled patients procedure. New instructions will be mailed out.

## 2020-08-17 ENCOUNTER — Other Ambulatory Visit: Payer: Self-pay | Admitting: Cardiovascular Disease

## 2020-08-23 ENCOUNTER — Ambulatory Visit: Payer: Medicare HMO | Admitting: Nurse Practitioner

## 2020-08-25 ENCOUNTER — Telehealth: Payer: Self-pay

## 2020-08-25 NOTE — Telephone Encounter (Signed)
Returned patients call. Pt would like to reschedule due to husband having some health issues. Pt verbalized she will call back to reschedule.

## 2020-09-06 ENCOUNTER — Ambulatory Visit: Admit: 2020-09-06 | Payer: Medicare HMO | Admitting: Gastroenterology

## 2020-09-06 SURGERY — COLONOSCOPY WITH PROPOFOL
Anesthesia: Choice

## 2020-11-15 ENCOUNTER — Ambulatory Visit: Payer: Medicare HMO | Admitting: Nurse Practitioner

## 2020-11-18 ENCOUNTER — Other Ambulatory Visit: Payer: Self-pay | Admitting: Cardiovascular Disease

## 2020-12-03 DIAGNOSIS — N812 Incomplete uterovaginal prolapse: Secondary | ICD-10-CM | POA: Diagnosis not present

## 2020-12-03 DIAGNOSIS — N952 Postmenopausal atrophic vaginitis: Secondary | ICD-10-CM | POA: Diagnosis not present

## 2020-12-03 DIAGNOSIS — N3941 Urge incontinence: Secondary | ICD-10-CM | POA: Diagnosis not present

## 2020-12-06 DIAGNOSIS — Z0000 Encounter for general adult medical examination without abnormal findings: Secondary | ICD-10-CM | POA: Diagnosis not present

## 2020-12-06 DIAGNOSIS — Z1159 Encounter for screening for other viral diseases: Secondary | ICD-10-CM | POA: Diagnosis not present

## 2020-12-07 DIAGNOSIS — E7800 Pure hypercholesterolemia, unspecified: Secondary | ICD-10-CM | POA: Diagnosis not present

## 2020-12-07 DIAGNOSIS — R7309 Other abnormal glucose: Secondary | ICD-10-CM | POA: Diagnosis not present

## 2020-12-07 DIAGNOSIS — Z1159 Encounter for screening for other viral diseases: Secondary | ICD-10-CM | POA: Diagnosis not present

## 2020-12-20 ENCOUNTER — Telehealth: Payer: Self-pay | Admitting: Gastroenterology

## 2020-12-20 NOTE — Telephone Encounter (Signed)
Patient called to let us know that she is "not having any problems" and she does not want to schedule for her EGD at this time.

## 2020-12-28 DIAGNOSIS — N958 Other specified menopausal and perimenopausal disorders: Secondary | ICD-10-CM | POA: Diagnosis not present

## 2020-12-28 DIAGNOSIS — N812 Incomplete uterovaginal prolapse: Secondary | ICD-10-CM | POA: Diagnosis not present

## 2020-12-28 DIAGNOSIS — N3946 Mixed incontinence: Secondary | ICD-10-CM | POA: Diagnosis not present

## 2020-12-31 DIAGNOSIS — D2271 Melanocytic nevi of right lower limb, including hip: Secondary | ICD-10-CM | POA: Diagnosis not present

## 2020-12-31 DIAGNOSIS — Z85828 Personal history of other malignant neoplasm of skin: Secondary | ICD-10-CM | POA: Diagnosis not present

## 2020-12-31 DIAGNOSIS — L308 Other specified dermatitis: Secondary | ICD-10-CM | POA: Diagnosis not present

## 2020-12-31 DIAGNOSIS — D2261 Melanocytic nevi of right upper limb, including shoulder: Secondary | ICD-10-CM | POA: Diagnosis not present

## 2020-12-31 DIAGNOSIS — D2262 Melanocytic nevi of left upper limb, including shoulder: Secondary | ICD-10-CM | POA: Diagnosis not present

## 2020-12-31 DIAGNOSIS — D225 Melanocytic nevi of trunk: Secondary | ICD-10-CM | POA: Diagnosis not present

## 2021-01-01 ENCOUNTER — Other Ambulatory Visit: Payer: Self-pay | Admitting: Nurse Practitioner

## 2021-01-01 DIAGNOSIS — G2581 Restless legs syndrome: Secondary | ICD-10-CM

## 2021-01-25 ENCOUNTER — Ambulatory Visit
Admission: EM | Admit: 2021-01-25 | Discharge: 2021-01-25 | Disposition: A | Discharge status: Home / Self Care | Payer: Medicare HMO | Attending: Family Medicine | Admitting: Family Medicine

## 2021-01-25 ENCOUNTER — Ambulatory Visit (INDEPENDENT_AMBULATORY_CARE_PROVIDER_SITE_OTHER): Payer: Medicare HMO

## 2021-01-25 ENCOUNTER — Ambulatory Visit: Admit: 2021-01-25 | Disposition: A | Discharge status: Home / Self Care | Payer: Self-pay

## 2021-01-25 DIAGNOSIS — M25469 Effusion, unspecified knee: Secondary | ICD-10-CM | POA: Diagnosis not present

## 2021-01-25 DIAGNOSIS — M25562 Pain in left knee: Secondary | ICD-10-CM

## 2021-01-25 MED ORDER — DOXYCYCLINE HYCLATE 100 MG PO CAPS: 100.0000 mg | ORAL_CAPSULE | Freq: Two times a day (BID) | ORAL | 0 refills | Status: DC

## 2021-01-25 NOTE — Discharge Instructions (Signed)
I am treating you for an infection.  There was an incidental lesion found on your x ray. You need to have an MRI to further look at this. Call your primary care doctor for this.  Advil as needed Rest, elevate.

## 2021-01-25 NOTE — ED Triage Notes (Signed)
Pt tripped over a root and fell, landing on L knee, two weeks ago.  Wasn't having issues with it but started having significant pain this morning, difficult to bend or walk.  Has two puncture like wounds on knee that are reddened.  Distal wound possibly has pus draining.  Some swelling noted.

## 2021-01-26 NOTE — ED Provider Notes (Signed)
[facility]    CSN: [id number] Arrival date & time: 01/25/21  1448      History   Chief Complaint Chief Complaint  Patient presents with  . Leg Pain    HPI [name] is a 75 y.o. female.   Patient is a 75 year old female presents today for fall and left knee pain and swelling.  Approximately 2 weeks ago she had a fall landing on the left knee.  After the fall she has had no issues with walking, standing.  There was no swelling.  She did have 2 small abrasions to the knee that she has been cleaning with alcohol and putting Vaseline on the area.  The swelling and pain started today.  No fever, chills, body aches, night sweats.   Leg Pain   Past Medical History:  Diagnosis Date  . Asthma   . H/O exercise stress test    at 52 years age  . Heart murmur   . Hyperlipidemia   . Hypertension   . Jaundice    In High school  . Jaundice    hx     Patient Active Problem List   Diagnosis Date Noted  . Encounter for screening mammogram for malignant neoplasm of breast 05/03/2020  . Non-ossified fibroma of bone 01/20/2020  . Glenohumeral arthritis, left 12/26/2019  . Primary osteoarthritis of left knee 12/26/2019  . Encounter for general adult medical examination with abnormal findings 11/16/2019  . Essential hypertension 11/16/2019  . Concentration deficit 11/16/2019  . Dysuria 11/16/2019  . Primary osteoarthritis of left hip 05/28/2019  . Uterovaginal prolapse, incomplete 04/28/2019  . Abnormal echocardiogram 11/11/2018  . Chronic left shoulder pain 04/08/2018  . Left hip pain 04/08/2018  . Fatigue 04/08/2018  . Abnormal weight gain 04/08/2018  . Vitamin D deficiency 04/08/2018  . Polyp of sigmoid colon 12/08/2017  . Esophageal leukoplakia   . Heartburn   . Gastric ulcer   . Reflux esophagitis   . Special screening for malignant neoplasms, colon   . Benign neoplasm of cecum   . Benign neoplasm of ascending colon   . Gastroesophageal reflux disease    . Low back pain 09/25/2016  . Family history of ischemic heart disease and other diseases of the circulatory system 08/30/2015  . Ophthalmoplegic migraine, not intractable 12/28/2014  . Hypertension   . Bunion, right foot 02/24/2013  . Allergic rhinitis 06/24/2012  . Restless leg syndrome 11/13/2011  . Mixed hyperlipidemia 10/05/2011  . Pure hypercholesterolemia 10/05/2011    Past Surgical History:  Procedure Laterality Date  . COLONOSCOPY WITH PROPOFOL N/A 06/18/2017   Procedure: COLONOSCOPY WITH PROPOFOL;  Surgeon: Lucilla Lame, MD;  Location: [place];  Service: Endoscopy;  Laterality: N/A;  . ESOPHAGOGASTRODUODENOSCOPY (EGD) WITH PROPOFOL N/A 06/18/2017   Procedure: ESOPHAGOGASTRODUODENOSCOPY (EGD) WITH PROPOFOL;  Surgeon: Lucilla Lame, MD;  Location: [place];  Service: Endoscopy;  Laterality: N/A;  . ESOPHAGOGASTRODUODENOSCOPY (EGD) WITH PROPOFOL N/A 10/05/2017   Procedure: ESOPHAGOGASTRODUODENOSCOPY (EGD) WITH PROPOFOL;  Surgeon: Lucilla Lame, MD;  Location: [place];  Service: Endoscopy;  Laterality: N/A;  . POLYPECTOMY  06/18/2017   Procedure: POLYPECTOMY;  Surgeon: Lucilla Lame, MD;  Location: [place];  Service: Endoscopy;;  . TUBAL LIGATION  1980    OB History   No obstetric history on file.      Home Medications    Prior to Admission medications   Medication Sig Start Date End Date Taking? Authorizing Provider  amLODipine (NORVASC) 5 MG tablet  Take 1 tablet (5 mg total) by mouth daily. 12/01/19  Yes Walker, Caitlin S, NP  atorvastatin (LIPITOR) 20 MG tablet TAKE 1 TABLET EVERY DAY USUALLY IN THE EVENING 11/18/20  Yes Gollan, Timothy J, MD  fexofenadine-pseudoephedrine (ALLEGRA-D ALLERGY & CONGESTION) 180-240 MG 24 hr tablet Take 1 tablet by mouth daily. 08/30/15  Yes Walker, Jennifer A, MD  doxycycline (VIBRAMYCIN) 100 MG capsule Take 1 capsule (100 mg total) by mouth 2 (two) times daily. 01/25/21   Bast, Traci A, NP  ibuprofen  (ADVIL) 100 MG tablet Take 100 mg by mouth every 6 (six) hours as needed for fever. Taking as needed    [provider]  rOPINIRole (REQUIP) 3 MG tablet Take 1 tablet (3 mg total) by mouth 3 (three) times daily as needed. 05/10/20   Boscia, Heather E, NP  esomeprazole (NEXIUM) 40 MG capsule  12/29/16 01/25/21  [provider]  fluticasone (FLONASE) 50 MCG/ACT nasal spray Place 2 sprays into both nostrils daily. 05/04/20 01/25/21  Khan, Fozia M, MD    Family History Family History  Problem Relation Age of Onset  . Heart attack Mother   . Heart disease Mother 65  . Heart attack Father   . Heart disease Father 70  . Diabetes Sister   . Heart disease Brother   . Heart disease Brother   . Diabetes Maternal Grandmother   . Breast cancer Other   . Colon cancer Neg Hx     Social History Social History   Tobacco Use  . Smoking status: Never Smoker  . Smokeless tobacco: Never Used  Vaping Use  . Vaping Use: Never used  Substance Use Topics  . Alcohol use: Yes    Alcohol/week: 7.0 standard drinks    Types: 7 Glasses of wine per week    Comment: Wine nightly  . Drug use: No     Allergies   Codeine and Penicillins   Review of Systems Review of Systems   Physical Exam Triage Vital Signs ED Triage Vitals  Enc Vitals Group     BP 01/25/21 1501 (!) 150/88     Pulse Rate 01/25/21 1501 81     Resp 01/25/21 1501 14     Temp 01/25/21 1501 (!) 97.5 F (36.4 C)     Temp Source 01/25/21 1501 Oral     SpO2 01/25/21 1501 94 %     Weight --      Height --      Head Circumference --      Peak Flow --      Pain Score 01/25/21 1503 5     Pain Loc --      Pain Edu? --      Excl. in GC? --    No data found.  Updated Vital Signs BP (!) 150/88 (BP Location: Right Arm)   Pulse 81   Temp (!) 97.5 F (36.4 C) (Oral)   Resp 14   SpO2 94%   Visual Acuity Right Eye Distance:   Left Eye Distance:   Bilateral Distance:    Right Eye Near:   Left Eye Near:     Bilateral Near:     Physical Exam Vitals and nursing note reviewed.  Constitutional:      General: She is not in acute distress.    Appearance: Normal appearance. She is not ill-appearing, toxic-appearing or diaphoretic.  HENT:     Head: Normocephalic.  Eyes:     Conjunctiva/sclera: Conjunctivae normal.  Pulmonary:       Effort: Pulmonary effort is normal.  Abdominal:     Palpations: Abdomen is soft.  Musculoskeletal:     Cervical back: Normal range of motion.     Left knee: Swelling and erythema present. Decreased range of motion. Tenderness present over the medial joint line and lateral joint line.     Comments: 2 abrasions noted.  No drainage  Skin:    General: Skin is warm and dry.     Findings: No rash.  Neurological:     Mental Status: She is alert.  Psychiatric:        Mood and Affect: Mood normal.      UC Treatments / Results  Labs (all labs ordered are listed, but only abnormal results are displayed) Labs Reviewed - No data to display  EKG   Radiology DG Knee 2 Views Left  Result Date: 01/25/2021 CLINICAL DATA:  Left knee pain status post fall EXAM: LEFT KNEE - 1-2 VIEW COMPARISON:  None. FINDINGS: No fracture or dislocation. Diffuse atherosclerotic changes of visualized arterial segments. 1.2 x 0.8 cm lucent lesion seen in the distal femur. IMPRESSION: 1. No acute abnormality of the left knee. 2. 1.2 x 0.8 cm lucent lesion seen in the distal femur with endosteal scalloping. Dedicated femur radiographs recommended for better visualization of this lesion. Differential diagnosis includes multiple myeloma or metastatic disease. Electronically Signed   By: Farhaan  Mir M.D.   On: 01/25/2021 15:41    Procedures Procedures (including critical care time)  Medications Ordered in UC Medications - No data to display  Initial Impression / Assessment and Plan / UC Course  I have reviewed the triage vital signs and the nursing notes.  Pertinent labs & imaging results  that were available during my care of the patient were reviewed by me and considered in my medical decision making (see chart for details).     Knee swelling X-ray without any acute findings of the knee.  Concern for infection at this time.  We will go ahead and treat with doxycycline. Incidental lesion found on femur in the x-ray.  Discussed this with patient and recommend follow-up with orthopedics or her primary care Rest, elevate the leg and Advil as needed Return precautions given.  Final Clinical Impressions(s) / UC Diagnoses   Final diagnoses:  Knee swelling     Discharge Instructions     I am treating you for an infection.  There was an incidental lesion found on your x ray. You need to have an MRI to further look at this. Call your primary care doctor for this.  Advil as needed Rest, elevate.     ED Prescriptions    Medication Sig Dispense Auth. Provider   doxycycline (VIBRAMYCIN) 100 MG capsule  (Status: Discontinued) Take 1 capsule (100 mg total) by mouth 2 (two) times daily. 20 capsule Bast, Traci A, NP   doxycycline (VIBRAMYCIN) 100 MG capsule Take 1 capsule (100 mg total) by mouth 2 (two) times daily. 20 capsule Bast, Traci A, NP     PDMP not reviewed this encounter.   Bast, Traci A, NP 01/26/21 0906  

## 2021-02-02 DIAGNOSIS — L03116 Cellulitis of left lower limb: Secondary | ICD-10-CM | POA: Diagnosis not present

## 2021-02-08 DIAGNOSIS — D480 Neoplasm of uncertain behavior of bone and articular cartilage: Secondary | ICD-10-CM | POA: Diagnosis not present

## 2021-02-08 DIAGNOSIS — D481 Neoplasm of uncertain behavior of connective and other soft tissue: Secondary | ICD-10-CM | POA: Diagnosis not present

## 2021-02-16 ENCOUNTER — Other Ambulatory Visit: Payer: Self-pay | Admitting: Cardiovascular Disease

## 2021-02-16 NOTE — Telephone Encounter (Signed)
LVM for patient to call back and schedule

## 2021-02-16 NOTE — Telephone Encounter (Signed)
Please call patient to schedule yearly follow up. Last seen in February 2021. Thank you

## 2021-02-25 NOTE — Telephone Encounter (Signed)
Attempted to schedule.  LMOV to call office.  ° °

## 2021-03-03 ENCOUNTER — Other Ambulatory Visit: Payer: Self-pay | Admitting: Cardiovascular Disease

## 2021-03-10 NOTE — Telephone Encounter (Signed)
Scheduled

## 2021-04-27 DIAGNOSIS — Z713 Dietary counseling and surveillance: Secondary | ICD-10-CM | POA: Diagnosis not present

## 2021-04-27 DIAGNOSIS — E663 Overweight: Secondary | ICD-10-CM | POA: Diagnosis not present

## 2021-05-09 DIAGNOSIS — N812 Incomplete uterovaginal prolapse: Secondary | ICD-10-CM | POA: Diagnosis not present

## 2021-05-09 DIAGNOSIS — Z01818 Encounter for other preprocedural examination: Secondary | ICD-10-CM | POA: Diagnosis not present

## 2021-05-16 ENCOUNTER — Ambulatory Visit: Payer: Medicare HMO | Admitting: Cardiovascular Disease

## 2021-05-20 DIAGNOSIS — I10 Essential (primary) hypertension: Secondary | ICD-10-CM | POA: Diagnosis not present

## 2021-05-20 DIAGNOSIS — E785 Hyperlipidemia, unspecified: Secondary | ICD-10-CM | POA: Diagnosis not present

## 2021-05-20 DIAGNOSIS — N816 Rectocele: Secondary | ICD-10-CM | POA: Diagnosis not present

## 2021-05-20 DIAGNOSIS — N8110 Cystocele, unspecified: Secondary | ICD-10-CM | POA: Diagnosis not present

## 2021-05-20 DIAGNOSIS — N72 Inflammatory disease of cervix uteri: Secondary | ICD-10-CM | POA: Diagnosis not present

## 2021-05-20 DIAGNOSIS — N812 Incomplete uterovaginal prolapse: Secondary | ICD-10-CM | POA: Diagnosis not present

## 2021-05-20 DIAGNOSIS — N814 Uterovaginal prolapse, unspecified: Secondary | ICD-10-CM | POA: Diagnosis not present

## 2021-05-20 DIAGNOSIS — N393 Stress incontinence (female) (male): Secondary | ICD-10-CM | POA: Diagnosis not present

## 2021-05-20 DIAGNOSIS — E782 Mixed hyperlipidemia: Secondary | ICD-10-CM | POA: Diagnosis not present

## 2021-05-20 DIAGNOSIS — N880 Leukoplakia of cervix uteri: Secondary | ICD-10-CM | POA: Diagnosis not present

## 2021-05-20 DIAGNOSIS — N800 Endometriosis of uterus: Secondary | ICD-10-CM | POA: Diagnosis not present

## 2021-06-06 DIAGNOSIS — R7303 Prediabetes: Secondary | ICD-10-CM | POA: Diagnosis not present

## 2021-06-06 DIAGNOSIS — I10 Essential (primary) hypertension: Secondary | ICD-10-CM | POA: Diagnosis not present

## 2021-06-06 DIAGNOSIS — E7800 Pure hypercholesterolemia, unspecified: Secondary | ICD-10-CM | POA: Diagnosis not present

## 2021-06-06 DIAGNOSIS — Z8249 Family history of ischemic heart disease and other diseases of the circulatory system: Secondary | ICD-10-CM | POA: Diagnosis not present

## 2021-06-13 DIAGNOSIS — I10 Essential (primary) hypertension: Secondary | ICD-10-CM | POA: Diagnosis not present

## 2021-06-13 DIAGNOSIS — R7303 Prediabetes: Secondary | ICD-10-CM | POA: Diagnosis not present

## 2021-06-13 DIAGNOSIS — G2581 Restless legs syndrome: Secondary | ICD-10-CM | POA: Diagnosis not present

## 2021-06-13 DIAGNOSIS — E7800 Pure hypercholesterolemia, unspecified: Secondary | ICD-10-CM | POA: Diagnosis not present

## 2021-06-26 ENCOUNTER — Emergency Department
Admission: EM | Admit: 2021-06-26 | Discharge: 2021-06-26 | Disposition: A | Discharge status: Home / Self Care | Payer: Medicare HMO | Attending: Emergency Medicine | Admitting: Emergency Medicine

## 2021-06-26 ENCOUNTER — Other Ambulatory Visit: Payer: Self-pay

## 2021-06-26 ENCOUNTER — Emergency Department: Payer: Medicare HMO

## 2021-06-26 DIAGNOSIS — R0981 Nasal congestion: Secondary | ICD-10-CM | POA: Insufficient documentation

## 2021-06-26 DIAGNOSIS — R059 Cough, unspecified: Secondary | ICD-10-CM | POA: Insufficient documentation

## 2021-06-26 DIAGNOSIS — R52 Pain, unspecified: Secondary | ICD-10-CM | POA: Diagnosis not present

## 2021-06-26 DIAGNOSIS — R509 Fever, unspecified: Secondary | ICD-10-CM | POA: Insufficient documentation

## 2021-06-26 DIAGNOSIS — R0602 Shortness of breath: Secondary | ICD-10-CM | POA: Insufficient documentation

## 2021-06-26 DIAGNOSIS — Z5321 Procedure and treatment not carried out due to patient leaving prior to being seen by health care provider: Secondary | ICD-10-CM | POA: Insufficient documentation

## 2021-06-26 LAB — COMPREHENSIVE METABOLIC PANEL
ALT: 33 U/L (ref 0–44)
AST: 35 U/L (ref 15–41)
Albumin: 4.1 g/dL (ref 3.5–5.0)
Alkaline Phosphatase: 53 U/L (ref 38–126)
Anion gap: 8 (ref 5–15)
BUN: 23 mg/dL (ref 8–23)
CO2: 25 mmol/L (ref 22–32)
Calcium: 8.9 mg/dL (ref 8.9–10.3)
Chloride: 103 mmol/L (ref 98–111)
Creatinine, Ser: 0.49 mg/dL (ref 0.44–1.00)
GFR, Estimated: >60 mL/min (ref >60)
Glucose, Bld: 187 mg/dL — ABNORMAL HIGH (ref 70–99)
Potassium: 3.8 mmol/L (ref 3.5–5.1)
Sodium: 136 mmol/L (ref 135–145)
Total Bilirubin: 0.6 mg/dL (ref 0.3–1.2)
Total Protein: 6.7 g/dL (ref 6.5–8.1)

## 2021-06-26 LAB — CBC
HCT: 39.4 % (ref 36.0–46.0)
Hemoglobin: 13.4 g/dL (ref 12.0–15.0)
MCH: 29.2 pg (ref 26.0–34.0)
MCHC: 34 g/dL (ref 30.0–36.0)
MCV: 85.8 fL (ref 80.0–100.0)
Platelets: 236 10*3/uL (ref 150–400)
RBC: 4.59 MIL/uL (ref 3.87–5.11)
RDW: 12.9 % (ref 11.5–15.5)
WBC: 13.9 10*3/uL — ABNORMAL HIGH (ref 4.0–10.5)
nRBC: 0 % (ref 0.0–0.2)

## 2021-06-26 LAB — LIPASE, BLOOD: Lipase: 34 U/L (ref 11–51)

## 2021-06-26 NOTE — ED Triage Notes (Signed)
Pt comes pov with generalized body aches, fever, cough, congestion, SOB for two days.

## 2021-06-26 NOTE — ED Notes (Signed)
Pt states she is feeling better and will follow up if needed with her MD because she is leaving.

## 2021-06-27 ENCOUNTER — Telehealth: Payer: Self-pay | Admitting: Emergency Medicine

## 2021-06-27 NOTE — Telephone Encounter (Signed)
Called patient due to left emergency department before provider exam to inquire about condition and follow up plans. Says she is going to call her doctor today.  I told her to make sure they look at the labs and xray from here.   She agrees.

## 2021-07-01 ENCOUNTER — Ambulatory Visit: Payer: Medicare HMO | Admitting: Cardiovascular Disease

## 2021-07-10 IMAGING — DX DG KNEE 1-2V*L*
2 series · 2 of 2 positions shown · non-contrast
Comparison: None.

CLINICAL DATA: Left knee pain status post fall

EXAM:
LEFT KNEE - 1-2 VIEW

[knee ap]
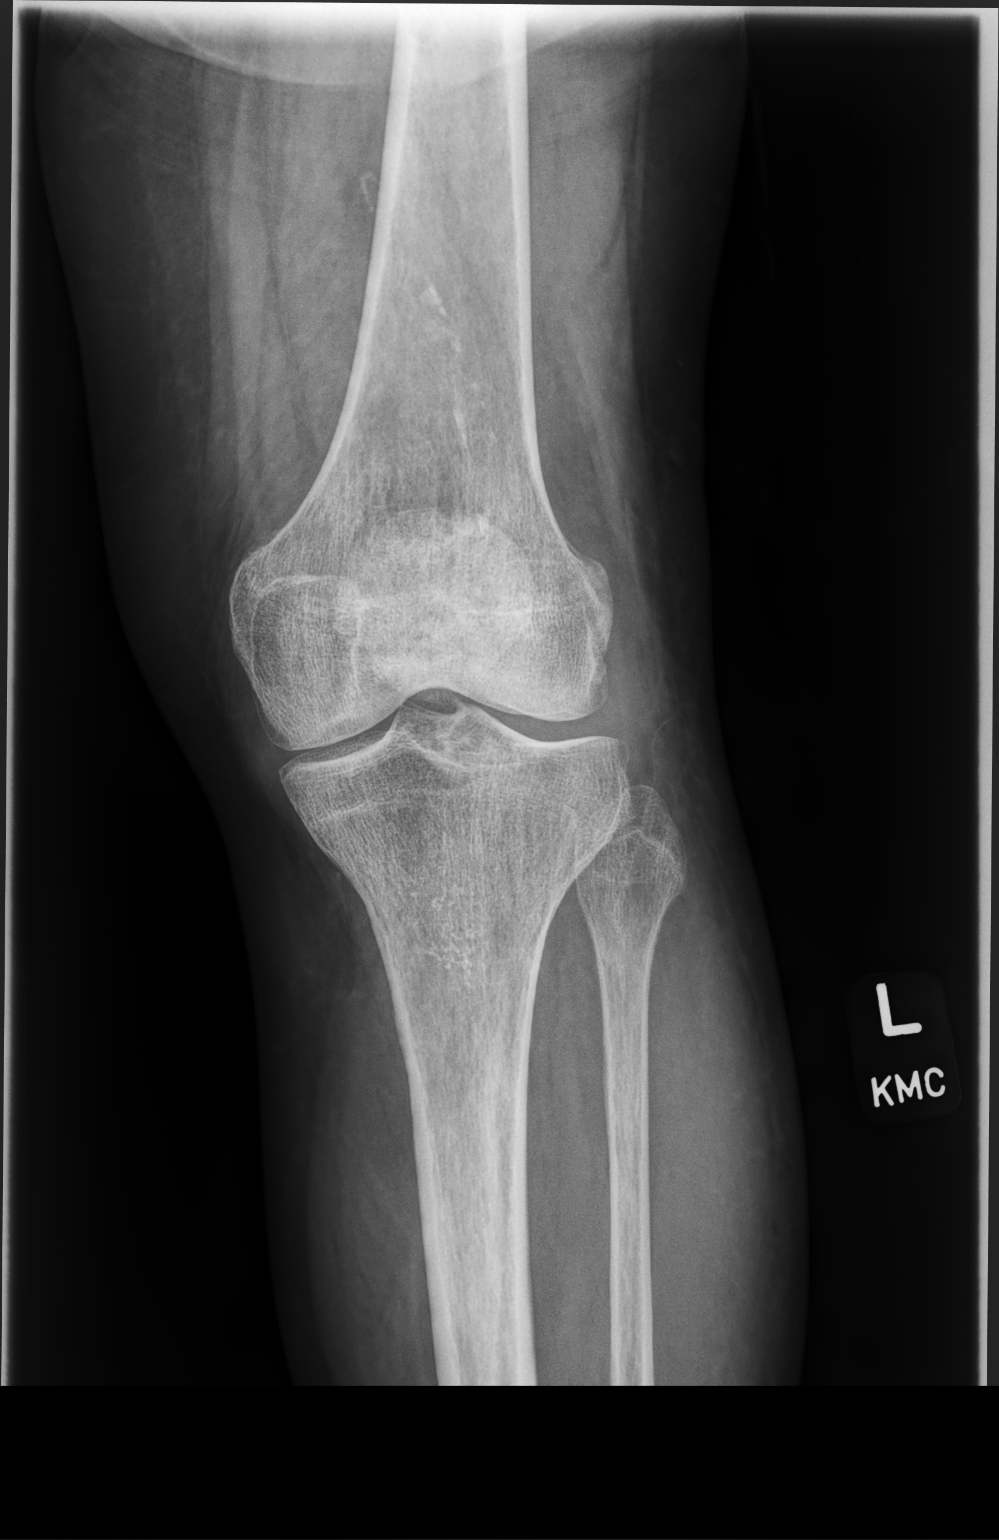

[knee lat]
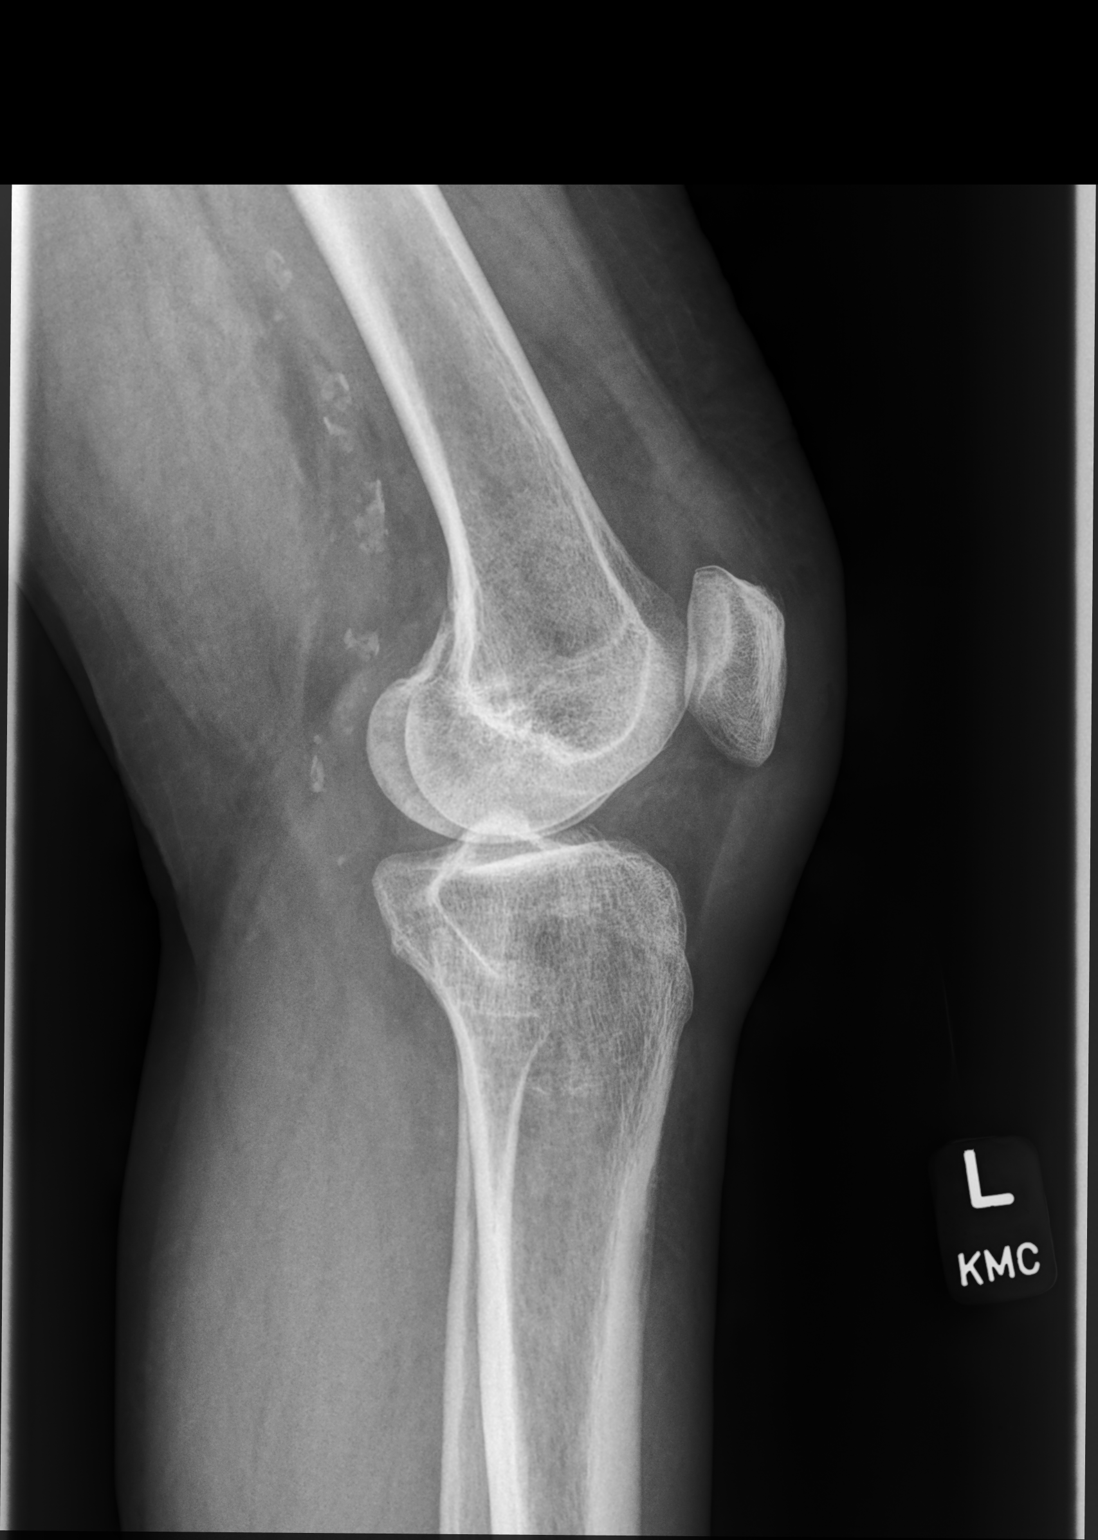

[2 of 2 positions shown; findings below may reference images not displayed]

FINDINGS: No fracture or dislocation. Diffuse atherosclerotic changes of
visualized arterial segments. 1.2 x 0.8 cm lucent lesion seen in the
distal femur.
IMPRESSION: 1. No acute abnormality of the left knee.
2. 1.2 x 0.8 cm lucent lesion seen in the distal femur with
endosteal scalloping. Dedicated femur radiographs recommended for
better visualization of this lesion. Differential diagnosis includes
multiple myeloma or metastatic disease.

## 2021-08-01 ENCOUNTER — Ambulatory Visit: Payer: Medicare HMO | Admitting: Cardiovascular Disease

## 2021-08-22 ENCOUNTER — Ambulatory Visit: Payer: Medicare HMO | Admitting: Cardiovascular Disease

## 2021-09-04 NOTE — Progress Notes (Signed)
Cardiology Office Note  Date:  09/05/2021   ID:  [name], [name] 04/25/1946, MRN [id number]  PCP:  [name]   Chief Complaint  Patient presents with   Other    12 month f/u no complaints today. Meds reviewed verbally with pt.    HPI:  [name] is a 75 year old woman with past medical history of HTN GERD Coronary calcium score of 123. 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  EKG personally reviewed by myself on todays visit Normal sinus rhythm rate 81 bpm no significant ST-T wave changes  Prior echocardiogram done through PMD No images available for review Report details normal left ventricular ejection fraction.    moderate left ventricular hypertrophy with Septal hypertrophy.   diastolic dysfunction mild MR, TR and AR.  mild pulmonary artery hypertension.  carotid ultrasound done through primary care showing mild buildup, results not available  Denies any significant shortness of breath or chest discomfort on exertion  PMH:   has a past medical history of Asthma, H/O exercise stress test, Heart murmur, Hyperlipidemia, Hypertension, Jaundice, and Jaundice.  PSH:    Past Surgical History:  Procedure Laterality Date   COLONOSCOPY WITH PROPOFOL N/A 06/18/2017   Procedure: COLONOSCOPY WITH PROPOFOL;  Surgeon: Lucilla Lame, MD;  Location: [place];  Service: Endoscopy;  Laterality: N/A;   ESOPHAGOGASTRODUODENOSCOPY (EGD) WITH PROPOFOL N/A  06/18/2017   Procedure: ESOPHAGOGASTRODUODENOSCOPY (EGD) WITH PROPOFOL;  Surgeon: Lucilla Lame, MD;  Location: [place];  Service: Endoscopy;  Laterality: N/A;   ESOPHAGOGASTRODUODENOSCOPY (EGD) WITH PROPOFOL N/A 10/05/2017   Procedure: ESOPHAGOGASTRODUODENOSCOPY (EGD) WITH PROPOFOL;  Surgeon: Lucilla Lame, MD;  Location: [place];  Service: Endoscopy;  Laterality: N/A;   PARTIAL HYSTERECTOMY     POLYPECTOMY  06/18/2017   Procedure: POLYPECTOMY;  Surgeon: Lucilla Lame, MD;  Location: [place];  Service: Endoscopy;;   TUBAL LIGATION  10/30/1978    Current Outpatient Medications  Medication Sig Dispense Refill   amLODipine (NORVASC) 5 MG tablet Take 1 tablet (5 mg total) by mouth daily. 90 tablet 3   atorvastatin (LIPITOR) 20 MG tablet TAKE 1 TABLET EVERY DAY USUALLY IN THE EVENING. PLEASE CALL OFFICE TO SCHEDULE AN APPOITNMENT 30 tablet 0   fexofenadine-pseudoephedrine (ALLEGRA-D ALLERGY & CONGESTION) 180-240 MG 24 hr tablet Take 1 tablet by mouth daily. 30 tablet 5   ibuprofen (ADVIL) 100 MG tablet Take 100 mg by mouth every 6 (six) hours as needed for fever. Taking as needed     rOPINIRole (REQUIP) 3 MG tablet Take 1 tablet (3 mg total) by mouth 3 (three) times daily as needed. 90 tablet 3   No current facility-administered medications for this visit.     Allergies:   Codeine and Penicillins   Social History:  The patient  reports that she has never smoked. She has never used smokeless tobacco. She reports current alcohol use of about 7.0 standard drinks per week. She reports that she does not use  drugs.   Family History:   family history includes Breast cancer in an other family member; Diabetes in her maternal grandmother and sister; Heart attack in her father and mother; Heart disease in her brother and brother; Heart disease (age of onset: 82) in her mother; Heart disease (age of onset: 38) in her father.    Review of Systems: Review of Systems   Constitutional: Negative.   Respiratory: Negative.    Cardiovascular: Negative.   Gastrointestinal: Negative.   Musculoskeletal: Negative.   Neurological: Negative.   Psychiatric/Behavioral: Negative.    All other systems reviewed and are negative.  PHYSICAL EXAM: VS:  BP 138/70 (BP Location: Left Arm, Patient Position: Sitting, Cuff Size: Normal)   Pulse 81   Ht 4\' 11"  (1.499 m)   Wt 145 lb 8 oz (66 kg)   SpO2 98%   BMI 29.39 kg/m  , BMI Body mass index is 29.39 kg/m. Constitutional:  oriented to person, place, and time. No distress.  HENT:  Head: Grossly normal Eyes:  no discharge. No scleral icterus.  Neck: No JVD, no carotid bruits  Cardiovascular: Regular rate and rhythm, no murmurs appreciated Pulmonary/Chest: Clear to auscultation bilaterally, no wheezes or rails Abdominal: Soft.  no distension.  no tenderness.  Musculoskeletal: Normal range of motion Neurological:  normal muscle tone. Coordination normal. No atrophy Skin: Skin warm and dry Psychiatric: normal affect, pleasant  Recent Labs: 06/26/2021: ALT 33; BUN 23; Creatinine, Ser 0.49; Hemoglobin 13.4; Platelets 236; Potassium 3.8; Sodium 136    Lipid Panel Lab Results  Component Value Date   CHOL 210 (H) 12/01/2019   HDL 97 12/01/2019   LDLCALC 102 (H) 12/01/2019   TRIG 59 12/01/2019      Wt Readings from Last 3 Encounters:  09/05/21 145 lb 8 oz (66 kg)  06/26/21 150 lb (68 kg)  04/26/20 140 lb 12.8 oz (63.9 kg)     ASSESSMENT AND PLAN:  Essential hypertension - Plan: EKG 12-Lead, CT CARDIAC SCORING Blood pressure is well controlled on today's visit. No changes made to the medications.  Coronary calcification Low calcium score 120, Recommended she take Lipitor daily, weight loss/lifestyle modification Denies anginal symptoms  Abnormal echocardiogram Normal LV function with LVH per prior outside echocardiogram No significant murmur, benign EKG, Presents once of blood pressure management No  further work-up needed   Total encounter time more than 25 minutes  Greater than 50% was spent in counseling and coordination of care with the patient    No orders of the defined types were placed in this encounter.    Signed, Esmond Plants, M.D., Ph.D. 09/05/2021  [place], [place]

## 2021-09-05 ENCOUNTER — Ambulatory Visit: Payer: Medicare HMO | Admitting: Cardiovascular Disease

## 2021-09-05 ENCOUNTER — Encounter: Payer: Self-pay | Admitting: Cardiovascular Disease

## 2021-09-05 ENCOUNTER — Other Ambulatory Visit: Payer: Self-pay

## 2021-09-05 VITALS — BP 138/70 | HR 81 | Ht 59.0 in | Wt 145.5 lb

## 2021-09-05 DIAGNOSIS — I2510 Atherosclerotic heart disease of native coronary artery without angina pectoris: Secondary | ICD-10-CM

## 2021-09-05 DIAGNOSIS — I10 Essential (primary) hypertension: Secondary | ICD-10-CM

## 2021-09-05 DIAGNOSIS — E782 Mixed hyperlipidemia: Secondary | ICD-10-CM

## 2021-09-05 MED ORDER — ATORVASTATIN CALCIUM 20 MG PO TABS: 20.0000 mg | ORAL_TABLET | Freq: Every day | ORAL | 3 refills | Status: DC

## 2021-09-05 MED ORDER — AMLODIPINE BESYLATE 5 MG PO TABS: 5.0000 mg | ORAL_TABLET | Freq: Every day | ORAL | 3 refills | Status: DC

## 2021-09-05 NOTE — Patient Instructions (Addendum)
Medication Instructions:  No changes  If you need a refill on your cardiac medications before your next appointment, please call your pharmacy.   Lab work: No new labs needed  Testing/Procedures: No new testing needed  Follow-Up: At [facility], you and your health needs are our priority.  As part of our continuing mission to provide you with exceptional heart care, we have created designated Provider Care Teams.  These Care Teams include your primary Cardiologist (physician) and Advanced Practice Providers (APPs -  Physician Assistants and Nurse Practitioners) who all work together to provide you with the care you need, when you need it.  You will need a follow up appointment as needed  Providers on your designated Care Team:   Christopher Berge, NP Ryan Dunn, PA-C Cadence Furth, PA-C  COVID-19 Vaccine Information can be found at: [email or web address] For questions related to vaccine distribution or appointments, please email [email or web address] or call [phone].    

## 2021-09-19 ENCOUNTER — Telehealth: Payer: Self-pay | Admitting: Clinical Medical Laboratory

## 2021-09-19 NOTE — Telephone Encounter (Signed)
Pt called in stating her friend came to an appointment to see Dr. Nicki Reaper. Pt friend spoke with Dr. Nicki Reaper about [name] becoming a new Pt. Pt friend stated that Dr. Nicki Reaper advise her to tell Mrs. Snodgrass to call the office and speak with [place]. Pt is requesting callback.

## 2021-09-21 NOTE — Telephone Encounter (Signed)
Ok

## 2021-09-21 NOTE — Telephone Encounter (Signed)
See staff message about this

## 2021-09-26 DIAGNOSIS — M2022 Hallux rigidus, left foot: Secondary | ICD-10-CM | POA: Diagnosis not present

## 2021-09-26 DIAGNOSIS — M2021 Hallux rigidus, right foot: Secondary | ICD-10-CM | POA: Diagnosis not present

## 2021-09-26 DIAGNOSIS — M722 Plantar fascial fibromatosis: Secondary | ICD-10-CM | POA: Diagnosis not present

## 2021-09-26 DIAGNOSIS — M216X2 Other acquired deformities of left foot: Secondary | ICD-10-CM | POA: Diagnosis not present

## 2021-09-26 DIAGNOSIS — M79671 Pain in right foot: Secondary | ICD-10-CM | POA: Diagnosis not present

## 2021-09-26 DIAGNOSIS — M216X1 Other acquired deformities of right foot: Secondary | ICD-10-CM | POA: Diagnosis not present

## 2021-09-26 DIAGNOSIS — Q6672 Congenital pes cavus, left foot: Secondary | ICD-10-CM | POA: Diagnosis not present

## 2021-09-26 DIAGNOSIS — Q6671 Congenital pes cavus, right foot: Secondary | ICD-10-CM | POA: Diagnosis not present

## 2021-09-27 NOTE — Telephone Encounter (Signed)
LM to schedule new patient appt in April or May

## 2022-01-25 ENCOUNTER — Encounter: Payer: Self-pay | Admitting: Internal Medicine

## 2022-02-06 ENCOUNTER — Ambulatory Visit: Payer: Medicare HMO | Admitting: Internal Medicine

## 2022-03-10 ENCOUNTER — Encounter: Payer: Self-pay | Admitting: Internal Medicine

## 2022-03-13 ENCOUNTER — Encounter: Payer: Self-pay | Admitting: Internal Medicine

## 2022-03-13 ENCOUNTER — Ambulatory Visit (INDEPENDENT_AMBULATORY_CARE_PROVIDER_SITE_OTHER): Payer: Medicare HMO | Admitting: Internal Medicine

## 2022-03-13 VITALS — BP 130/80 | HR 74 | Temp 97.5°F | Resp 15 | Ht 60.0 in | Wt 151.6 lb

## 2022-03-13 DIAGNOSIS — J309 Allergic rhinitis, unspecified: Secondary | ICD-10-CM

## 2022-03-13 DIAGNOSIS — E782 Mixed hyperlipidemia: Secondary | ICD-10-CM | POA: Diagnosis not present

## 2022-03-13 DIAGNOSIS — I10 Essential (primary) hypertension: Secondary | ICD-10-CM

## 2022-03-13 DIAGNOSIS — R739 Hyperglycemia, unspecified: Secondary | ICD-10-CM | POA: Insufficient documentation

## 2022-03-13 DIAGNOSIS — R109 Unspecified abdominal pain: Secondary | ICD-10-CM | POA: Insufficient documentation

## 2022-03-13 DIAGNOSIS — E559 Vitamin D deficiency, unspecified: Secondary | ICD-10-CM

## 2022-03-13 DIAGNOSIS — Z1211 Encounter for screening for malignant neoplasm of colon: Secondary | ICD-10-CM

## 2022-03-13 DIAGNOSIS — R1030 Lower abdominal pain, unspecified: Secondary | ICD-10-CM

## 2022-03-13 DIAGNOSIS — Z1231 Encounter for screening mammogram for malignant neoplasm of breast: Secondary | ICD-10-CM | POA: Diagnosis not present

## 2022-03-13 DIAGNOSIS — G2581 Restless legs syndrome: Secondary | ICD-10-CM

## 2022-03-13 LAB — LIPID PANEL
Cholesterol: 199 mg/dL (ref 0–200)
HDL: 67.3 mg/dL (ref >39.00)
LDL Cholesterol: 119 mg/dL — ABNORMAL HIGH (ref 0–99)
NonHDL: 131.38
Total CHOL/HDL Ratio: 3
Triglycerides: 64 mg/dL (ref 0.0–149.0)
VLDL: 12.8 mg/dL (ref 0.0–40.0)

## 2022-03-13 LAB — CBC WITH DIFFERENTIAL/PLATELET
Basophils Absolute: 0.1 10*3/uL (ref 0.0–0.1)
Basophils Relative: 0.5 % (ref 0.0–3.0)
Eosinophils Absolute: 0.1 10*3/uL (ref 0.0–0.7)
Eosinophils Relative: 0.7 % (ref 0.0–5.0)
HCT: 40.6 % (ref 36.0–46.0)
Hemoglobin: 13.8 g/dL (ref 12.0–15.0)
Lymphocytes Relative: 13.9 % (ref 12.0–46.0)
Lymphs Abs: 1.4 10*3/uL (ref 0.7–4.0)
MCHC: 34 g/dL (ref 30.0–36.0)
MCV: 84.7 fl (ref 78.0–100.0)
Monocytes Absolute: 0.7 10*3/uL (ref 0.1–1.0)
Monocytes Relative: 7.4 % (ref 3.0–12.0)
Neutro Abs: 7.7 10*3/uL (ref 1.4–7.7)
Neutrophils Relative %: 77.5 % — ABNORMAL HIGH (ref 43.0–77.0)
Platelets: 255 10*3/uL (ref 150.0–400.0)
RBC: 4.8 Mil/uL (ref 3.87–5.11)
RDW: 13.7 % (ref 11.5–15.5)
WBC: 9.9 10*3/uL (ref 4.0–10.5)

## 2022-03-13 LAB — TSH: TSH: 1.03 u[IU]/mL (ref 0.35–5.50)

## 2022-03-13 LAB — URINALYSIS, ROUTINE W REFLEX MICROSCOPIC
Bilirubin Urine: NEGATIVE
Hgb urine dipstick: NEGATIVE
Ketones, ur: NEGATIVE
Leukocytes,Ua: NEGATIVE
Nitrite: NEGATIVE
Specific Gravity, Urine: 1.025 (ref 1.000–1.030)
Total Protein, Urine: NEGATIVE
Urine Glucose: NEGATIVE
Urobilinogen, UA: 0.2 (ref 0.0–1.0)
pH: 6 (ref 5.0–8.0)

## 2022-03-13 LAB — HEPATIC FUNCTION PANEL
ALT: 25 U/L (ref 0–35)
AST: 19 U/L (ref 0–37)
Albumin: 4.3 g/dL (ref 3.5–5.2)
Alkaline Phosphatase: 53 U/L (ref 39–117)
Bilirubin, Direct: 0.1 mg/dL (ref 0.0–0.3)
Total Bilirubin: 0.6 mg/dL (ref 0.2–1.2)
Total Protein: 6.7 g/dL (ref 6.0–8.3)

## 2022-03-13 LAB — BASIC METABOLIC PANEL
BUN: 20 mg/dL (ref 6–23)
CO2: 25 mEq/L (ref 19–32)
Calcium: 9.2 mg/dL (ref 8.4–10.5)
Chloride: 105 mEq/L (ref 96–112)
Creatinine, Ser: 0.45 mg/dL (ref 0.40–1.20)
GFR: 94.89 mL/min (ref >60.00)
Glucose, Bld: 119 mg/dL — ABNORMAL HIGH (ref 70–99)
Potassium: 4.1 mEq/L (ref 3.5–5.1)
Sodium: 137 mEq/L (ref 135–145)

## 2022-03-13 LAB — HEMOGLOBIN A1C: Hgb A1c MFr Bld: 5.8 % (ref 4.6–6.5)

## 2022-03-13 NOTE — Patient Instructions (Signed)
Benefiber - daily 

## 2022-03-13 NOTE — Progress Notes (Signed)
Patient ID: [name], female   DOB: 1946/06/20, 76 y.o.   MRN: [id number]   Subjective:    Patient ID: [name], female    DOB: Jul 26, 1946, 76 y.o.   MRN: [id number]   Patient here to establish care   Chief Complaint  Patient presents with   Establish Care   Hypertension   Hyperlipidemia   .   HPI Previously seeing Dr Netty Starring.  History of hypertension and GERD.  Previous calcium score 123.  In reviewing her history, reports history of allergy/sinus issues.  Previously received allergy shots.  Has a history of polyps.  Intermittent lower abdominal pain.  No constant pain. Some soft stool occasionally.  Some occasional soft stool.  No chest pain.  Breathing overall stable.  Increased stress.  Husband has dementia.  Overall she feel she is handling things relatively well.  Does not take her medications as directed.  S/p hysterectomy due to bladder dropping.  No history of abnormal pap smear.     Past Medical History:  Diagnosis Date   Allergy    Arthritis    Asthma    H/O exercise stress test    at 5 years age   Heart murmur    Hyperlipidemia    Hypertension    Jaundice    In High school   Jaundice    hx    Past Surgical History:  Procedure Laterality Date   COLONOSCOPY WITH PROPOFOL N/A 06/18/2017   Procedure: COLONOSCOPY WITH PROPOFOL;  Surgeon: Lucilla Lame, MD;  Location: [facility];  Service: Endoscopy;  Laterality: N/A;   ESOPHAGOGASTRODUODENOSCOPY (EGD) WITH PROPOFOL N/A 06/18/2017   Procedure: ESOPHAGOGASTRODUODENOSCOPY (EGD) WITH PROPOFOL;  Surgeon: Lucilla Lame, MD;  Location: [place];  Service: Endoscopy;  Laterality: N/A;   ESOPHAGOGASTRODUODENOSCOPY (EGD) WITH PROPOFOL N/A 10/05/2017   Procedure: ESOPHAGOGASTRODUODENOSCOPY (EGD) WITH PROPOFOL;  Surgeon: Lucilla Lame, MD;  Location: [facility];  Service: Endoscopy;  Laterality: N/A;   PARTIAL HYSTERECTOMY     POLYPECTOMY  06/18/2017   Procedure: POLYPECTOMY;   Surgeon: Lucilla Lame, MD;  Location: [place];  Service: Endoscopy;;   TUBAL LIGATION  10/30/1978   VAGINAL HYSTERECTOMY     Still has ovaries 08/2021.   Family History  Problem Relation Age of Onset   Heart attack Mother    Heart disease Mother 54   Heart attack Father    Heart disease Father 69   Diabetes Sister    Heart disease Brother    Heart disease Brother    Diabetes Maternal Grandmother    Breast cancer Other    Colon cancer Neg Hx    Social History   Socioeconomic History   Marital status: Married    Spouse name: Not on file   Number of children: 3   Years of education: Not on file   Highest education level: Not on file  Occupational History   Occupation: Self Employed - Hairdresser  Tobacco Use   Smoking status: Never   Smokeless tobacco: Never  Vaping Use   Vaping Use: Never used  Substance and Sexual Activity   Alcohol use: Yes    Alcohol/week: 7.0 standard drinks    Types: 7 Glasses of wine per week    Comment: Wine nightly   Drug use: No   Sexual activity: Not on file  Other Topics Concern   Not on file  Social History Narrative   Lives in [place]. Works at [employer] in [place]  Daily Caffeine Use:  2 coffee   Regular Exercise -  NO            Social Determinants of Health   Financial Resource Strain: Not on file  Food Insecurity: Not on file  Transportation Needs: Not on file  Physical Activity: Not on file  Stress: Not on file  Social Connections: Not on file     Review of Systems  Constitutional:  Negative for appetite change and unexpected weight change.  HENT:  Negative for congestion and sinus pressure.   Respiratory:  Negative for cough, chest tightness and shortness of breath.   Cardiovascular:  Negative for chest pain, palpitations and leg swelling.  Gastrointestinal:  Negative for abdominal pain, diarrhea, nausea and vomiting.  Genitourinary:  Negative for difficulty urinating and dysuria.  Musculoskeletal:   Negative for joint swelling and myalgias.  Skin:  Negative for color change and rash.  Neurological:  Negative for dizziness, light-headedness and headaches.  Psychiatric/Behavioral:  Negative for agitation and dysphoric mood.       Objective:     BP 130/80 (BP Location: Left Arm, Patient Position: Sitting, Cuff Size: Small)   Pulse 74   Temp (!) 97.5 F (36.4 C) (Temporal)   Resp 15   Ht 5' (1.524 m)   Wt 151 lb 9.6 oz (68.8 kg)   HC 60" (152.4 cm)   SpO2 95%   BMI 29.61 kg/m  Wt Readings from Last 3 Encounters:  03/10/22 151 lb 9.6 oz (68.8 kg)  09/05/21 145 lb 8 oz (66 kg)  06/26/21 150 lb (68 kg)    Physical Exam Vitals reviewed.  Constitutional:      General: She is not in acute distress.    Appearance: Normal appearance.  HENT:     Head: Normocephalic and atraumatic.     Right Ear: External ear normal.     Left Ear: External ear normal.  Eyes:     General: No scleral icterus.       Right eye: No discharge.        Left eye: No discharge.     Conjunctiva/sclera: Conjunctivae normal.  Neck:     Thyroid: No thyromegaly.  Cardiovascular:     Rate and Rhythm: Normal rate and regular rhythm.  Pulmonary:     Effort: No respiratory distress.     Breath sounds: Normal breath sounds. No wheezing.  Abdominal:     General: Bowel sounds are normal.     Palpations: Abdomen is soft.     Tenderness: There is no abdominal tenderness.  Musculoskeletal:        General: No swelling or tenderness.     Cervical back: Neck supple. No tenderness.  Lymphadenopathy:     Cervical: No cervical adenopathy.  Skin:    Findings: No erythema or rash.  Neurological:     Mental Status: She is alert.  Psychiatric:        Mood and Affect: Mood normal.        Behavior: Behavior normal.     Outpatient Encounter Medications as of 03/13/2022  Medication Sig   amLODipine (NORVASC) 5 MG tablet Take 1 tablet (5 mg total) by mouth daily.   atorvastatin (LIPITOR) 20 MG tablet Take 1  tablet (20 mg total) by mouth daily.   fexofenadine-pseudoephedrine (ALLEGRA-D ALLERGY & CONGESTION) 180-240 MG 24 hr tablet Take 1 tablet by mouth daily.   fluticasone (FLONASE) 50 MCG/ACT nasal spray Place into the nose.   ibuprofen (ADVIL) 100 MG  tablet Take 100 mg by mouth every 6 (six) hours as needed for fever. Taking as needed   rOPINIRole (REQUIP) 3 MG tablet Take 1 tablet (3 mg total) by mouth 3 (three) times daily as needed.   [DISCONTINUED] atorvastatin (LIPITOR) 20 MG tablet Take 1 tablet by mouth daily.   [DISCONTINUED] esomeprazole (NEXIUM) 40 MG capsule    [DISCONTINUED] fluticasone (FLONASE) 50 MCG/ACT nasal spray Place 2 sprays into both nostrils daily.   No facility-administered encounter medications on file as of 03/13/2022.     Lab Results  Component Value Date   WBC 9.9 03/13/2022   HGB 13.8 03/13/2022   HCT 40.6 03/13/2022   PLT 255.0 03/13/2022   GLUCOSE 119 (H) 03/13/2022   CHOL 199 03/13/2022   TRIG 64.0 03/13/2022   HDL 67.30 03/13/2022   LDLDIRECT 158.6 02/24/2013   LDLCALC 119 (H) 03/13/2022   ALT 25 03/13/2022   AST 19 03/13/2022   NA 137 03/13/2022   K 4.1 03/13/2022   CL 105 03/13/2022   CREATININE 0.45 03/13/2022   BUN 20 03/13/2022   CO2 25 03/13/2022   TSH 1.03 03/13/2022   HGBA1C 5.8 03/13/2022   MICROALBUR 1.2 06/29/2014    DG Chest 2 View  Result Date: 06/26/2021 CLINICAL DATA:  Shortness of breath, generalized body aches, fever, cough, congestion, shortness of breath for 2 days. EXAM: CHEST - 2 VIEW COMPARISON:  Cardiac scoring CT dated 11/25/2018. FINDINGS: Patchy airspace opacities throughout the LEFT lung, most confluent within the LEFT perihilar lung. Heart size is upper normal. Atherosclerotic calcifications noted at the aortic arch. Scoliosis of the thoracolumbar spine. No acute-appearing osseous abnormality. IMPRESSION: 1. Patchy airspace opacities throughout the LEFT lung, most confluent within the LEFT perihilar lung. This is most  likely multifocal pneumonia, but neoplastic process cannot be excluded. 2. Aortic atherosclerosis. Electronically Signed   By: Franki Cabot M.D.   On: 06/26/2021 10:08       Assessment & Plan:   Problem List Items Addressed This Visit     Abdominal pain    Some lower abdominal pressure.  Check urine and routine labs. Keep bowels moving.  Follow.  If persistent may need scan. Discussed benefiber daily.        Relevant Orders   CBC with Differential/Platelet (Completed)   Urinalysis, Routine w reflex microscopic (Completed)   Allergic rhinitis    Allegra for allergy symptoms.  Follow.        Encounter for screening mammogram for malignant neoplasm of breast - Primary    Due screening mammogram.        Relevant Orders   MM 3D SCREEN BREAST BILATERAL   Hyperglycemia    Low carb diet and exercise.  Follow met b and a1c.        Relevant Orders   Hemoglobin A1c (Completed)   Hypertension    Supposed to be taking amlodipine.  Apparently does not take regularly.  Blood pressure as outlined.  Follow pressures.  Check metabolic panel.        Relevant Orders   Basic metabolic panel (Completed)   Mixed hyperlipidemia    Apparently not taking her cholesterol medication.  Low cholesterol diet and exercise.  Check lipid panel.        Relevant Orders   Hepatic function panel (Completed)   Lipid panel (Completed)   TSH (Completed)   Restless leg syndrome    Requip.  No problems reported today.        Special screening for  malignant neoplasms, colon    Appears she is due colonoscopy.  Will need GI referral.        Vitamin D deficiency    Documented.  Will confirm when last checked.  Plan for recheck.          Einar Pheasant, MD

## 2022-03-16 ENCOUNTER — Other Ambulatory Visit: Payer: Self-pay

## 2022-03-16 DIAGNOSIS — E782 Mixed hyperlipidemia: Secondary | ICD-10-CM

## 2022-03-17 ENCOUNTER — Telehealth: Payer: Self-pay | Admitting: Internal Medicine

## 2022-03-17 NOTE — Telephone Encounter (Signed)
Pt advised. Will start using saline nasal spray and nasacort. Will call Monday if not feeling better.

## 2022-03-17 NOTE — Telephone Encounter (Signed)
Pt called in stating that she is still sick... Pt stated that the medication that Dr. Nicki Reaper prescribe is not helping... Pt stated that she feel like she has gotten worst... Pt was wondering if Dr. Nicki Reaper can prescribe something else for her... Pt requesting callback.Marland KitchenMarland Kitchen

## 2022-03-17 NOTE — Telephone Encounter (Signed)
If she is having more nasal congestion and drainage which could be contributing to the hoarseness, etc - she can add saline nasal spray - flush nose at least 2x/day and nasacort nasal spray - 2 sprays each nostril one time per day.  Do this in the evening. If persistent symptoms, will need to be evaluated.

## 2022-03-20 ENCOUNTER — Encounter: Payer: Self-pay | Admitting: Internal Medicine

## 2022-03-20 NOTE — Assessment & Plan Note (Signed)
Low carb diet and exercise.  Follow met b and a1c.  

## 2022-03-20 NOTE — Assessment & Plan Note (Signed)
Due screening mammogram.

## 2022-03-20 NOTE — Assessment & Plan Note (Signed)
Supposed to be taking amlodipine.  Apparently does not take regularly.  Blood pressure as outlined.  Follow pressures.  Check metabolic panel.

## 2022-03-20 NOTE — Assessment & Plan Note (Signed)
Documented.  Will confirm when last checked.  Plan for recheck.

## 2022-03-20 NOTE — Assessment & Plan Note (Addendum)
Some lower abdominal pressure.  Check urine and routine labs. Keep bowels moving.  Follow.  If persistent may need scan. Discussed benefiber daily.

## 2022-03-20 NOTE — Assessment & Plan Note (Signed)
Requip.  No problems reported today.  

## 2022-03-20 NOTE — Assessment & Plan Note (Signed)
Allegra for allergy symptoms.  Follow.

## 2022-03-20 NOTE — Assessment & Plan Note (Signed)
Appears she is due colonoscopy.  Will need GI referral.

## 2022-03-20 NOTE — Assessment & Plan Note (Signed)
Apparently not taking her cholesterol medication.  Low cholesterol diet and exercise.  Check lipid panel.

## 2022-03-22 ENCOUNTER — Telehealth: Payer: Self-pay | Admitting: *Deleted

## 2022-03-22 NOTE — Telephone Encounter (Signed)
-----   Message from Einar Pheasant, MD sent at 03/22/2022  1:57 AM EDT ----- Can you see me about this.  See recent result notes.  Per medication list previously prescribed, she was on lipitor '20mg'$  and amlodipine '5mg'$  q day.  Need to clarify what she is taking.

## 2022-03-22 NOTE — Telephone Encounter (Signed)
Please clarify medication taking or forward to Nurse.

## 2022-04-27 ENCOUNTER — Telehealth: Payer: Self-pay | Admitting: Internal Medicine

## 2022-04-27 NOTE — Telephone Encounter (Signed)
Copied from [place] [id number]. Topic: Medicare AWV >> Apr 27, 2022 11:20 AM Devoria Glassing wrote: Reason for CRM: Called patient to schedule Annual Wellness Visit.  Please schedule with Nurse Health Advisor Denisa O'Brien-Blaney, LPN at [facility].  Please call [phone] ask for [facility]

## 2022-04-27 NOTE — Telephone Encounter (Signed)
Patient returned call from [facility].  I read [name]'s message to patient.

## 2022-05-03 ENCOUNTER — Encounter: Payer: Self-pay | Admitting: Internal Medicine

## 2022-05-03 ENCOUNTER — Ambulatory Visit (INDEPENDENT_AMBULATORY_CARE_PROVIDER_SITE_OTHER): Payer: Medicare HMO

## 2022-05-03 ENCOUNTER — Telehealth: Payer: Self-pay | Admitting: Internal Medicine

## 2022-05-03 ENCOUNTER — Ambulatory Visit (INDEPENDENT_AMBULATORY_CARE_PROVIDER_SITE_OTHER): Payer: Medicare HMO | Admitting: Internal Medicine

## 2022-05-03 VITALS — BP 130/70 | HR 62 | Temp 98.9°F | Resp 19 | Ht 60.0 in | Wt 147.4 lb

## 2022-05-03 DIAGNOSIS — Z1231 Encounter for screening mammogram for malignant neoplasm of breast: Secondary | ICD-10-CM

## 2022-05-03 DIAGNOSIS — E7800 Pure hypercholesterolemia, unspecified: Secondary | ICD-10-CM

## 2022-05-03 DIAGNOSIS — R21 Rash and other nonspecific skin eruption: Secondary | ICD-10-CM

## 2022-05-03 DIAGNOSIS — Z860100 Personal history of colon polyps, unspecified: Secondary | ICD-10-CM

## 2022-05-03 DIAGNOSIS — R9389 Abnormal findings on diagnostic imaging of other specified body structures: Secondary | ICD-10-CM | POA: Diagnosis not present

## 2022-05-03 DIAGNOSIS — R739 Hyperglycemia, unspecified: Secondary | ICD-10-CM

## 2022-05-03 DIAGNOSIS — Z8249 Family history of ischemic heart disease and other diseases of the circulatory system: Secondary | ICD-10-CM

## 2022-05-03 DIAGNOSIS — E782 Mixed hyperlipidemia: Secondary | ICD-10-CM | POA: Diagnosis not present

## 2022-05-03 DIAGNOSIS — I10 Essential (primary) hypertension: Secondary | ICD-10-CM | POA: Diagnosis not present

## 2022-05-03 DIAGNOSIS — G2581 Restless legs syndrome: Secondary | ICD-10-CM

## 2022-05-03 LAB — HEPATIC FUNCTION PANEL
ALT: 26 U/L (ref 0–35)
AST: 22 U/L (ref 0–37)
Albumin: 4.1 g/dL (ref 3.5–5.2)
Alkaline Phosphatase: 44 U/L (ref 39–117)
Bilirubin, Direct: 0.1 mg/dL (ref 0.0–0.3)
Total Bilirubin: 0.4 mg/dL (ref 0.2–1.2)
Total Protein: 6.3 g/dL (ref 6.0–8.3)

## 2022-05-03 MED ORDER — NYSTATIN 100000 UNIT/GM EX CREA: 1.0000 | TOPICAL_CREAM | Freq: Two times a day (BID) | CUTANEOUS | 0 refills | Status: AC

## 2022-05-03 NOTE — Assessment & Plan Note (Signed)
Had cxr in 05/2022 - Patchy airspace opacities throughout the LEFT lung, most confluent within the LEFT perihilar lung. This is most likely multifocal pneumonia, but neoplastic process cannot be excluded. Needs f/u cxr to confirm clear.  No cough or congestion.  No sob.

## 2022-05-03 NOTE — Progress Notes (Addendum)
Patient ID: [name], female   DOB: Apr 25, 1946, 76 y.o.   MRN: [id number]   Subjective:    Patient ID: [name], female    DOB: 09-Jun-1946, 76 y.o.   MRN: [id number]   Patient here for a scheduled follow up.   Chief Complaint  Patient presents with   Hypertension   Hyperlipidemia   .   Hypertension Pertinent negatives include no chest pain, headaches, palpitations or shortness of breath.  Hyperlipidemia Pertinent negatives include no chest pain or shortness of breath.   She is now taking her blood pressure medication and cholesterol medication regularly.  Blood pressure better.  Tolerating.  Trying to watch her diet.  Low carb diet.  No chest pain or sob reported.  Family history of heart disease.  She is having persistent intermittent loose (soft) stool.  Intermittent flares.  Last yesterday.  May occur 1-2x/week.  Last that day and resolves.  May have 2-3 episodes.  No blood.  No known triggers.  Does report a peri rectal rash.  Itches/irritation.  States is some better now.  In reviewing, had cxr in 05/2021.  Needs f/u to confirm clear.  Also discussed due colonoscopy.  Last 05/2017.  Tubular adnoma - recommended five year follow up.  Overdue mammogram.   Past Medical History:  Diagnosis Date   Allergy    Arthritis    Asthma    H/O exercise stress test    at 13 years age   Heart murmur    Hyperlipidemia    Hypertension    Jaundice    In High school   Jaundice    hx    Past Surgical History:  Procedure Laterality Date   COLONOSCOPY WITH PROPOFOL N/A 06/18/2017   Procedure: COLONOSCOPY WITH PROPOFOL;  Surgeon: Lucilla Lame, MD;  Location: [facility];  Service: Endoscopy;  Laterality: N/A;   ESOPHAGOGASTRODUODENOSCOPY (EGD) WITH PROPOFOL N/A 06/18/2017   Procedure: ESOPHAGOGASTRODUODENOSCOPY (EGD) WITH PROPOFOL;  Surgeon: Lucilla Lame, MD;  Location: [facility];  Service: Endoscopy;  Laterality: N/A;   ESOPHAGOGASTRODUODENOSCOPY (EGD) WITH  PROPOFOL N/A 10/05/2017   Procedure: ESOPHAGOGASTRODUODENOSCOPY (EGD) WITH PROPOFOL;  Surgeon: Lucilla Lame, MD;  Location: [facility];  Service: Endoscopy;  Laterality: N/A;   PARTIAL HYSTERECTOMY     POLYPECTOMY  06/18/2017   Procedure: POLYPECTOMY;  Surgeon: Lucilla Lame, MD;  Location: [facility];  Service: Endoscopy;;   TUBAL LIGATION  10/30/1978   VAGINAL HYSTERECTOMY     Still has ovaries 08/2021.   Family History  Problem Relation Age of Onset   Heart attack Mother    Heart disease Mother 70   Heart attack Father    Heart disease Father 63   Diabetes Sister    Heart disease Brother    Heart disease Brother    Diabetes Maternal Grandmother    Breast cancer Other    Colon cancer Neg Hx    Social History   Socioeconomic History   Marital status: Married    Spouse name: Not on file   Number of children: 3   Years of education: Not on file   Highest education level: Not on file  Occupational History   Occupation: Self Employed - Hairdresser  Tobacco Use   Smoking status: Never   Smokeless tobacco: Never  Vaping Use   Vaping Use: Never used  Substance and Sexual Activity   Alcohol use: Yes    Alcohol/week: 7.0 standard drinks of alcohol    Types: 7 Glasses  of wine per week    Comment: Wine nightly   Drug use: No   Sexual activity: Not on file  Other Topics Concern   Not on file  Social History Narrative   Lives in [place]. Works at [employer] in [employer]      Daily Caffeine Use:  2 coffee   Regular Exercise -  NO            Social Determinants of Radio broadcast assistant Strain: Not on file  Food Insecurity: Not on file  Transportation Needs: Not on file  Physical Activity: Not on file  Stress: Not on file  Social Connections: Not on file     Review of Systems  Constitutional:  Negative for appetite change and unexpected weight change.  HENT:  Negative for congestion and sinus pressure.   Respiratory:  Negative for cough,  chest tightness and shortness of breath.   Cardiovascular:  Negative for chest pain, palpitations and leg swelling.  Gastrointestinal:  Negative for abdominal pain, nausea and vomiting.       Soft stool as outlined.  Intermittent flares.   Genitourinary:  Negative for difficulty urinating and dysuria.  Musculoskeletal:  Negative for gait problem and joint swelling.  Skin:  Negative for color change.       Peri rectal irritation/rash.   Neurological:  Negative for dizziness, light-headedness and headaches.  Psychiatric/Behavioral:  Negative for agitation and dysphoric mood.        Objective:     BP 130/70 (BP Location: Left Arm, Patient Position: Sitting, Cuff Size: Small)   Pulse 62   Temp 98.9 F (37.2 C) (Temporal)   Resp 19   Ht 5' (1.524 m)   Wt 147 lb 6.4 oz (66.9 kg)   SpO2 95%   BMI 28.79 kg/m  Wt Readings from Last 3 Encounters:  05/03/22 147 lb 6.4 oz (66.9 kg)  03/10/22 151 lb 9.6 oz (68.8 kg)  09/05/21 145 lb 8 oz (66 kg)    Physical Exam Vitals reviewed.  Constitutional:      General: She is not in acute distress.    Appearance: Normal appearance.  HENT:     Head: Normocephalic and atraumatic.     Right Ear: External ear normal.     Left Ear: External ear normal.  Eyes:     General: No scleral icterus.       Right eye: No discharge.        Left eye: No discharge.     Conjunctiva/sclera: Conjunctivae normal.  Neck:     Thyroid: No thyromegaly.  Cardiovascular:     Rate and Rhythm: Normal rate and regular rhythm.     Comments: 6-3/1 systolic murmur (not new per pt) Pulmonary:     Effort: No respiratory distress.     Breath sounds: Normal breath sounds. No wheezing.  Abdominal:     General: Bowel sounds are normal.     Palpations: Abdomen is soft.     Tenderness: There is no abdominal tenderness.  Genitourinary:    Comments: Declined rectal exam.  Musculoskeletal:        General: No swelling or tenderness.     Cervical back: Neck supple. No  tenderness.  Lymphadenopathy:     Cervical: No cervical adenopathy.  Skin:    Findings: No erythema or rash.  Neurological:     Mental Status: She is alert.  Psychiatric:        Mood and Affect: Mood normal.  Behavior: Behavior normal.      Outpatient Encounter Medications as of 05/03/2022  Medication Sig   amLODipine (NORVASC) 5 MG tablet Take 1 tablet (5 mg total) by mouth daily.   atorvastatin (LIPITOR) 20 MG tablet Take 1 tablet (20 mg total) by mouth daily.   fexofenadine-pseudoephedrine (ALLEGRA-D ALLERGY & CONGESTION) 180-240 MG 24 hr tablet Take 1 tablet by mouth daily.   fluticasone (FLONASE) 50 MCG/ACT nasal spray Place into the nose.   ibuprofen (ADVIL) 100 MG tablet Take 100 mg by mouth every 6 (six) hours as needed for fever. Taking as needed   nystatin cream (MYCOSTATIN) Apply 1 Application topically 2 (two) times daily.   rOPINIRole (REQUIP) 3 MG tablet Take 1 tablet (3 mg total) by mouth 3 (three) times daily as needed.   [DISCONTINUED] esomeprazole (NEXIUM) 40 MG capsule    No facility-administered encounter medications on file as of 05/03/2022.     Lab Results  Component Value Date   WBC 9.9 03/13/2022   HGB 13.8 03/13/2022   HCT 40.6 03/13/2022   PLT 255.0 03/13/2022   GLUCOSE 119 (H) 03/13/2022   CHOL 199 03/13/2022   TRIG 64.0 03/13/2022   HDL 67.30 03/13/2022   LDLDIRECT 158.6 02/24/2013   LDLCALC 119 (H) 03/13/2022   ALT 25 03/13/2022   AST 19 03/13/2022   NA 137 03/13/2022   K 4.1 03/13/2022   CL 105 03/13/2022   CREATININE 0.45 03/13/2022   BUN 20 03/13/2022   CO2 25 03/13/2022   TSH 1.03 03/13/2022   HGBA1C 5.8 03/13/2022   MICROALBUR 1.2 06/29/2014    DG Chest 2 View  Result Date: 06/26/2021 CLINICAL DATA:  Shortness of breath, generalized body aches, fever, cough, congestion, shortness of breath for 2 days. EXAM: CHEST - 2 VIEW COMPARISON:  Cardiac scoring CT dated 11/25/2018. FINDINGS: Patchy airspace opacities throughout the LEFT  lung, most confluent within the LEFT perihilar lung. Heart size is upper normal. Atherosclerotic calcifications noted at the aortic arch. Scoliosis of the thoracolumbar spine. No acute-appearing osseous abnormality. IMPRESSION: 1. Patchy airspace opacities throughout the LEFT lung, most confluent within the LEFT perihilar lung. This is most likely multifocal pneumonia, but neoplastic process cannot be excluded. 2. Aortic atherosclerosis. Electronically Signed   By: Franki Cabot M.D.   On: 06/26/2021 10:08       Assessment & Plan:   Problem List Items Addressed This Visit     Abnormal CXR    Had cxr in 05/2022 - Patchy airspace opacities throughout the LEFT lung, most confluent within the LEFT perihilar lung. This is most likely multifocal pneumonia, but neoplastic process cannot be excluded. Needs f/u cxr to confirm clear.  No cough or congestion.  No sob.       Relevant Orders   DG Chest 2 View   Encounter for screening mammogram for malignant neoplasm of breast - Primary   Relevant Orders   MM 3D SCREEN BREAST BILATERAL   Family history of ischemic heart disease and other diseases of the circulatory system    Strong family history of CAD.  Requested f/u calcium score. Discussed coronary calcium score.  Agreeable.  Schedule.       Relevant Orders   CT CARDIAC SCORING   History of colon polyps    Had colonoscopy 05/2017 - polyps removed (tubular adenoma).  Recommended f/u colonoscopy in 5 years.       Relevant Orders   Ambulatory referral to Gastroenterology   Hyperglycemia    Low carb  diet and exercise.  Follow met b and a1c.       Hypertension    Taking amlodipine regularly now.  Discussed continuing to take regularly.  Blood pressure improved and doing well.  Continue amlodipine.  Follow.       Mixed hyperlipidemia   Pure hypercholesterolemia    Taking lipitor.  Low cholesterol diet and exercise.  Follow lipid panel and liver function tests.        Rash    Peri rectal  rash.  Nystatin cream.  Notify me if does not resolve.       Restless leg syndrome    Requip.  No problems reported today.         Einar Pheasant, MD

## 2022-05-03 NOTE — Assessment & Plan Note (Signed)
Taking amlodipine regularly now.  Discussed continuing to take regularly.  Blood pressure improved and doing well.  Continue amlodipine.  Follow.

## 2022-05-03 NOTE — Assessment & Plan Note (Signed)
Had colonoscopy 05/2017 - polyps removed (tubular adenoma).  Recommended f/u colonoscopy in 5 years.

## 2022-05-03 NOTE — Assessment & Plan Note (Signed)
Peri rectal rash.  Nystatin cream.  Notify me if does not resolve.

## 2022-05-03 NOTE — Assessment & Plan Note (Signed)
Requip.  No problems reported today.

## 2022-05-03 NOTE — Assessment & Plan Note (Signed)
Low carb diet and exercise.  Follow met b and a1c.  

## 2022-05-03 NOTE — Assessment & Plan Note (Addendum)
Strong family history of CAD.  Requested f/u calcium score. Discussed coronary calcium score.  Agreeable.  Schedule.

## 2022-05-03 NOTE — Assessment & Plan Note (Signed)
Taking lipitor.  Low cholesterol diet and exercise.  Follow lipid panel and liver function tests.

## 2022-05-03 NOTE — Telephone Encounter (Signed)
Lft pt vm to call ofc . thanks 

## 2022-05-04 ENCOUNTER — Encounter: Payer: Self-pay | Admitting: Internal Medicine

## 2022-05-04 DIAGNOSIS — I700 Atherosclerosis of aorta: Secondary | ICD-10-CM | POA: Insufficient documentation

## 2022-05-05 ENCOUNTER — Other Ambulatory Visit: Payer: Self-pay

## 2022-05-05 ENCOUNTER — Telehealth: Payer: Self-pay

## 2022-05-05 DIAGNOSIS — Z86010 Personal history of colonic polyps: Secondary | ICD-10-CM

## 2022-05-05 NOTE — Telephone Encounter (Signed)
Gastroenterology Pre-Procedure Review  Request Date: 07/04/22 Requesting Physician: Dr. Allen Norris  PATIENT REVIEW QUESTIONS: The patient responded to the following health history questions as indicated:    1. Are you having any GI issues? no 2. Do you have a personal history of Polyps? yes (2018 performed by Dr. Allen Norris) 3. Do you have a family history of Colon Cancer or Polyps? no 4. Diabetes Mellitus? no 5. Joint replacements in the past 12 months?no 6. Major health problems in the past 3 months? Hysterectomy in November  2022 7. Any artificial heart valves, MVP, or defibrillator?no    MEDICATIONS & ALLERGIES:    Patient reports the following regarding taking any anticoagulation/antiplatelet therapy:   Plavix, Coumadin, Eliquis, Xarelto, Lovenox, Pradaxa, Brilinta, or Effient? no Aspirin? no  Patient confirms/reports the following medications:  Current Outpatient Medications  Medication Sig Dispense Refill   amLODipine (NORVASC) 5 MG tablet Take 1 tablet (5 mg total) by mouth daily. 90 tablet 3   atorvastatin (LIPITOR) 20 MG tablet Take 1 tablet (20 mg total) by mouth daily. 90 tablet 3   fexofenadine-pseudoephedrine (ALLEGRA-D ALLERGY & CONGESTION) 180-240 MG 24 hr tablet Take 1 tablet by mouth daily. 30 tablet 5   fluticasone (FLONASE) 50 MCG/ACT nasal spray Place into the nose.     ibuprofen (ADVIL) 100 MG tablet Take 100 mg by mouth every 6 (six) hours as needed for fever. Taking as needed     nystatin cream (MYCOSTATIN) Apply 1 Application topically 2 (two) times daily. 30 g 0   rOPINIRole (REQUIP) 3 MG tablet Take 1 tablet (3 mg total) by mouth 3 (three) times daily as needed. 90 tablet 3   No current facility-administered medications for this visit.    Patient confirms/reports the following allergies:  Allergies  Allergen Reactions   Codeine Nausea Only   Penicillins Swelling and Rash    No orders of the defined types were placed in this encounter.   AUTHORIZATION  INFORMATION Primary Insurance: 1D#: Group #:  Secondary Insurance: 1D#: Group #:  SCHEDULE INFORMATION: Date: 07/04/22 Time: Location: [facility]

## 2022-05-15 ENCOUNTER — Ambulatory Visit
Admission: RE | Admit: 2022-05-15 | Discharge: 2022-05-15 | Disposition: A | Discharge status: Home / Self Care | Payer: Medicare HMO | Source: Ambulatory Visit | Attending: Internal Medicine | Admitting: Internal Medicine

## 2022-05-15 DIAGNOSIS — Z8249 Family history of ischemic heart disease and other diseases of the circulatory system: Secondary | ICD-10-CM | POA: Insufficient documentation

## 2022-05-18 ENCOUNTER — Telehealth: Payer: Self-pay

## 2022-05-18 NOTE — Telephone Encounter (Signed)
No answer when called for scheduled AWV. Left message to reschedule.  ?

## 2022-05-22 ENCOUNTER — Ambulatory Visit (INDEPENDENT_AMBULATORY_CARE_PROVIDER_SITE_OTHER): Payer: Medicare HMO

## 2022-05-22 VITALS — Ht 60.0 in | Wt 147.0 lb

## 2022-05-22 DIAGNOSIS — Z0000 Encounter for general adult medical examination without abnormal findings: Secondary | ICD-10-CM | POA: Diagnosis not present

## 2022-05-22 NOTE — Progress Notes (Signed)
Subjective:   [name] is a 76 y.o. female who presents for Medicare Annual (Subsequent) preventive examination.  Review of Systems    No ROS.  Medicare Wellness Virtual Visit.  Visual/audio telehealth visit, UTA vital signs.   See social history for additional risk factors.   Cardiac Risk Factors include: advanced age (>20mn, >>19women)     Objective:    Today's Vitals   05/22/22 0902  Weight: 147 lb (66.7 kg)  Height: 5' (1.524 m)   Body mass index is 28.71 kg/m.     05/22/2022    9:04 AM 06/26/2021    9:24 AM 10/05/2017    7:03 AM 06/18/2017    7:09 AM 10/16/2016    8:25 PM 10/12/2016    8:15 AM 01/17/2016    8:19 AM  Advanced Directives  Does Patient Have a Medical Advance Directive? Yes No No No No No No  Type of [place]        Does patient want to make changes to medical advance directive? No - Patient declined        Copy of HScurryin Chart? No - copy requested        Would patient like information on creating a medical advance directive?   Yes (MAU/Ambulatory/Procedural Areas - Information given)  No - Patient declined      Current Medications (verified) Outpatient Encounter Medications as of 05/22/2022  Medication Sig   amLODipine (NORVASC) 5 MG tablet Take 1 tablet (5 mg total) by mouth daily.   atorvastatin (LIPITOR) 20 MG tablet Take 1 tablet (20 mg total) by mouth daily.   fexofenadine-pseudoephedrine (ALLEGRA-D ALLERGY & CONGESTION) 180-240 MG 24 hr tablet Take 1 tablet by mouth daily.   fluticasone (FLONASE) 50 MCG/ACT nasal spray Place into the nose.   ibuprofen (ADVIL) 100 MG tablet Take 100 mg by mouth every 6 (six) hours as needed for fever. Taking as needed   nystatin cream (MYCOSTATIN) Apply 1 Application topically 2 (two) times daily.   rOPINIRole (REQUIP) 3 MG tablet Take 1 tablet (3 mg total) by mouth 3 (three) times daily as needed.   [DISCONTINUED] esomeprazole  (NEXIUM) 40 MG capsule    No facility-administered encounter medications on file as of 05/22/2022.    Allergies (verified) Codeine and Penicillins   History: Past Medical History:  Diagnosis Date   Allergy    Arthritis    Asthma    H/O exercise stress test    at 89years age   Heart murmur    Hyperlipidemia    Hypertension    Jaundice    In High school   Jaundice    hx    Past Surgical History:  Procedure Laterality Date   COLONOSCOPY WITH PROPOFOL N/A 06/18/2017   Procedure: COLONOSCOPY WITH PROPOFOL;  Surgeon: WLucilla Lame MD;  [facility]  Service: Endoscopy;  Laterality: N/A;   ESOPHAGOGASTRODUODENOSCOPY (EGD) WITH PROPOFOL N/A 06/18/2017   Procedure: ESOPHAGOGASTRODUODENOSCOPY (EGD) WITH PROPOFOL;  Surgeon: WLucilla Lame MD;  [place] [facility]  Service: Endoscopy;  Laterality: N/A;   ESOPHAGOGASTRODUODENOSCOPY (EGD) WITH PROPOFOL N/A 10/05/2017   Procedure: ESOPHAGOGASTRODUODENOSCOPY (EGD) WITH PROPOFOL;  Surgeon: WLucilla Lame MD;  [place] [facility]  Service: Endoscopy;  Laterality: N/A;   PARTIAL HYSTERECTOMY     POLYPECTOMY  06/18/2017   Procedure: POLYPECTOMY;  Surgeon: WLucilla Lame MD;  [facility]  Service: Endoscopy;;   TUBAL LIGATION  10/30/1978   VAGINAL HYSTERECTOMY     Still has ovaries 08/2021.   Family History  Problem Relation Age of Onset   Heart attack Mother    Heart disease Mother 42   Heart attack Father    Heart disease Father 23   Diabetes Sister    Heart disease Brother    Heart disease Brother    Diabetes Maternal Grandmother    Breast cancer Other    Colon cancer Neg Hx    Social History   Socioeconomic History   Marital status: Married    Spouse name: Not on file   Number of children: 3   Years of education: Not on file   Highest education level: Not on file  Occupational History   Occupation: Self Employed - Hairdresser  Tobacco Use   Smoking status: Never    Smokeless tobacco: Never  Vaping Use   Vaping Use: Never used  Substance and Sexual Activity   Alcohol use: Yes    Alcohol/week: 7.0 standard drinks of alcohol    Types: 7 Glasses of wine per week    Comment: Wine nightly   Drug use: No   Sexual activity: Not on file  Other Topics Concern   Not on file  Social History Narrative   Lives in [place]. Works at [employer] in [place]      Daily Caffeine Use:  2 coffee   Regular Exercise -  NO            Social Determinants of Health   Financial Resource Strain: Low Risk  (05/22/2022)   Overall Financial Resource Strain (CARDIA)    Difficulty of Paying Living Expenses: Not hard at all  Food Insecurity: No Food Insecurity (05/22/2022)   Hunger Vital Sign    Worried About Running Out of Food in the Last Year: Never true    Ran Out of Food in the Last Year: Never true  Transportation Needs: No Transportation Needs (05/22/2022)   PRAPARE - Hydrologist (Medical): No    Lack of Transportation (Non-Medical): No  Physical Activity: Not on file  Stress: No Stress Concern Present (05/22/2022)   [place]    Feeling of Stress : Not at all  Social Connections: Unknown (05/22/2022)   Social Connection and Isolation Panel [NHANES]    Frequency of Communication with Friends and Family: More than three times a week    Frequency of Social Gatherings with Friends and Family: More than three times a week    Attends Religious Services: Not on Advertising copywriter or Organizations: Not on file    Attends Archivist Meetings: Not on file    Marital Status: Married    Tobacco Counseling Counseling given: Not Answered   Clinical Intake:  Pre-visit preparation completed: Yes        Diabetes: No  How often do you need to have someone help you when you read instructions, pamphlets, or other written materials from your doctor or  pharmacy?: 1 - Never Interpreter Needed?: No    Activities of Daily Living    05/22/2022    9:06 AM  In your present state of health, do you have any difficulty performing the following activities:  Hearing? 0  Vision? 0  Difficulty concentrating or making decisions? 0  Walking or climbing stairs? 0  Dressing or bathing? 0  Doing errands, shopping? 0  Preparing Food  and eating ? N  Using the Toilet? N  In the past six months, have you accidently leaked urine? N  Do you have problems with loss of bowel control? N  Managing your Medications? N  Managing your Finances? N  Housekeeping or managing your Housekeeping? N   Patient Care Team: Einar Pheasant, MD as PCP - General (Internal Medicine) Minna Merritts, MD as PCP - Cardiology (Cardiology)  Indicate any recent Medical Services you may have received from other than [facility] providers in the past year (date may be approximate).     Assessment:   This is a routine wellness examination for [place].  Virtual Visit via Telephone Note  I connected with  MILAN CLARE on 05/22/22 at  9:00 AM EDT by telephone and verified that I am speaking with the correct person using two identifiers.  Persons participating in the virtual visit: patient/Nurse Health Advisor   I discussed the limitations of performing an evaluation and management service by telehealth. We continued and completed visit with audio only. Some vital signs may be absent or patient reported.   Hearing/Vision screen Hearing Screening - Comments:: Patient is able to hear conversational tones without difficulty.  No issues reported. Vision Screening - Comments:: Followed by [facility] Wears corrective lenses They have seen their ophthalmologist in the last 12 months.   Dietary issues and exercise activities discussed: Current Exercise Habits: Home exercise routine, Intensity: Mild Healthy diet Good water intake   Goals Addressed             This Visit's  Progress    Maintain healthy lifestyle       Stay active Healthy diet Stay hydrated       Depression Screen    05/22/2022    9:04 AM 05/03/2022    7:10 AM 03/10/2022    4:49 PM 04/26/2020    2:29 PM 11/14/2019   10:25 AM 02/18/2019    9:20 AM 09/09/2018    8:38 AM  PHQ 2/9 Scores  PHQ - 2 Score 0 0 0 0 0 0 0    Fall Risk    05/22/2022    9:06 AM 05/03/2022    7:10 AM 03/10/2022    4:47 PM 04/26/2020    2:29 PM 11/14/2019   10:25 AM  [place] in the past year? 0 0 0 0 0  Number falls in past yr: 0 0     Injury with Fall?  0     Risk for fall due to :  No Fall Risks     Follow up Falls evaluation completed Follow up appointment Falls evaluation completed     [place]: Home free of loose throw rugs in walkways, pet beds, electrical cords, etc? Yes  Adequate lighting in your home to reduce risk of falls? Yes   ASSISTIVE DEVICES UTILIZED TO PREVENT FALLS: Life alert? No  Use of a cane, walker or w/c? No   TIMED UP AND GO: Was the test performed? No .   Cognitive Function: Patient is alert and oriented x3.     11/14/2019   10:27 AM 06/21/2018   11:12 AM  MMSE - Mini Mental State Exam  Orientation to time 5 5  Orientation to Place 5 5  Registration 3 3  Attention/ Calculation 5 5  Recall 3 3  Language- name 2 objects 2 2  Language- repeat 1 1  Language- follow 3 step command 3 3  Language- read & follow direction 1 1  Write a sentence 1 1  Copy design 1 1  Total score 30 30        05/22/2022   10:17 AM  6CIT Screen  What Year? 0 points  What month? 0 points  What time? 0 points  Count back from 20 0 points  Months in reverse 0 points  Repeat phrase 0 points  Total Score 0 points   Immunizations Immunization History  Administered Date(s) Administered   Influenza Inj Mdck Quad Pf 08/02/2018   Influenza Split 10/05/2011, 09/18/2016   Influenza,inj,Quad PF,6+ Mos 06/29/2014, 08/02/2015   Influenza-Unspecified  06/29/2014, 08/02/2015, 08/02/2018   PFIZER Comirnaty(Gray Top)Covid-19 Tri-Sucrose Vaccine 11/24/2019, 12/15/2019   PFIZER(Purple Top)SARS-COV-2 Vaccination 11/24/2019, 12/15/2019   Pneumococcal Conjugate-13 08/02/2015   Pneumococcal Polysaccharide-23 02/24/2013   Tdap 06/24/2012   Screening Tests Health Maintenance  Topic Date Due   COVID-19 Vaccine (5 - Pfizer series) 06/07/2022 (Originally 02/09/2020)   Zoster Vaccines- Shingrix (1 of 2) 06/13/2022 (Originally 12/15/1997)   INFLUENZA VACCINE  05/30/2022   TETANUS/TDAP  06/24/2022   MAMMOGRAM  07/02/2022   COLONOSCOPY (Pts 45-64yr Insurance coverage will need to be confirmed)  06/19/2027   Pneumonia Vaccine 76 Years old  Completed   DEXA SCAN  Completed   Hepatitis C Screening  Completed   HPV VACCINES  Aged Out   Health Maintenance There are no preventive care reminders to display for this patient.  Lung Cancer Screening: (Low Dose CT Chest recommended if Age 74-80years, 30 pack-year currently smoking OR have quit w/in 15years.) does not qualify.   Vision Screening: Recommended annual ophthalmology exams for early detection of glaucoma and other disorders of the eye.  Dental Screening: Recommended annual dental exams for proper oral hygiene  Community Resource Referral / Chronic Care Management: CRR required this visit?  No   CCM required this visit?  No      Plan:   Keep all routine maintenance appointments.   I have personally reviewed and noted the following in the patient's chart:   Medical and social history Use of alcohol, tobacco or illicit drugs  Current medications and supplements including opioid prescriptions.  Functional ability and status Nutritional status Physical activity Advanced directives List of other physicians Hospitalizations, surgeries, and ER visits in previous 12 months Vitals Screenings to include cognitive, depression, and falls Referrals and appointments  In addition, I have  reviewed and discussed with patient certain preventive protocols, quality metrics, and best practice recommendations. A written personalized care plan for preventive services as well as general preventive health recommendations were provided to patient.     OVarney Biles LPN   [id number]

## 2022-05-22 NOTE — Patient Instructions (Addendum)
  [name] , Thank you for taking time to come for your Medicare Wellness Visit. I appreciate your ongoing commitment to your health goals. Please review the following plan we discussed and let me know if I can assist you in the future.   These are the goals we discussed:  Goals      Maintain healthy lifestyle     Stay active Healthy diet Stay hydrated        This is a list of the screening recommended for you and due dates:  Health Maintenance  Topic Date Due   COVID-19 Vaccine (5 - Pfizer series) 06/07/2022*   Zoster (Shingles) Vaccine (1 of 2) 06/13/2022*   Flu Shot  05/30/2022   Tetanus Vaccine  06/24/2022   Mammogram  07/02/2022   Colon Cancer Screening  06/19/2027   Pneumonia Vaccine  Completed   DEXA scan (bone density measurement)  Completed   Hepatitis C Screening: USPSTF Recommendation to screen - Ages 39-79 yo.  Completed   HPV Vaccine  Aged Out  *Topic was postponed. The date shown is not the original due date.

## 2022-06-06 ENCOUNTER — Telehealth: Payer: Self-pay | Admitting: Internal Medicine

## 2022-06-06 NOTE — Telephone Encounter (Signed)
Pt called in stating that she had a test done that DR. [name] had referred her to do.... Pt stated that she received her results back and they had stated that her levels were high... Pt stated that someone advised her to go see a cardiologist... Pt was wondering if she needed to schedule that appt or do our office schedule the appt... Pt requesting callback.Marland KitchenMarland Kitchen

## 2022-06-07 NOTE — Telephone Encounter (Signed)
Pt called wanting an update on cardiologist appointment

## 2022-06-07 NOTE — Telephone Encounter (Signed)
Pt advised can call Dr Rockey Situ office to sched Note was sent to office to call pt to sched per [facility], but has not heard anything.

## 2022-06-30 ENCOUNTER — Telehealth: Payer: Self-pay

## 2022-06-30 MED ORDER — NA SULFATE-K SULFATE-MG SULF 17.5-3.13-1.6 GM/177ML PO SOLN: 1.0000 | Freq: Once | ORAL | 0 refills | Status: AC

## 2022-06-30 NOTE — Telephone Encounter (Signed)
Patient returned call back to the office.  [place] the beach, and apologizes that she misplaced her instructions.  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  Her colonoscopy has been rescheduled to Monday 07/31/22 at [facility] instead of [facility].  Wannetta Sender and Jackelyn Poling have been notified.  New instructions have been prepared, and rx sent to pharmacy.  Thanks,  [place], [place]

## 2022-06-30 NOTE — Telephone Encounter (Signed)
Patient lvm stating she did not receive her colonoscopy instructions.  Instructions were printed and prepared for mail upon scheduling on 05/05/22.  I've lvm for patient to call back asap since its Friday and we close at [facility].  Thanks,  [place], [place]

## 2022-07-11 ENCOUNTER — Telehealth: Payer: Self-pay

## 2022-07-11 ENCOUNTER — Other Ambulatory Visit: Payer: Self-pay

## 2022-07-11 MED ORDER — GOLYTELY 236 G PO SOLR: 4000.0000 mL | Freq: Once | ORAL | 0 refills | Status: AC

## 2022-07-11 NOTE — Telephone Encounter (Signed)
Pt LVM stating Suprep is going to cost over $100.  I attempted to call her back to let her know that I've changed her prep to Golytely.  I was unable to reach her due to her voicemail is currently full.  Prep has been changed to Golytely.  Instructions provided with rx sent to pharmacy.  I've also left message asking  the pharmacist to call patient when rx is ready.  Thanks,  [place], [place]

## 2022-07-13 ENCOUNTER — Telehealth: Payer: Self-pay | Admitting: Internal Medicine

## 2022-07-13 NOTE — Telephone Encounter (Signed)
Patient has a lab appt 07/21/22, there are no orders in.

## 2022-07-14 NOTE — Telephone Encounter (Signed)
Kimberly states she is returning our call.  I read Dr. Randell [name]'s note to Kimberly.  I cancelled Kimberly's lab appointment with [place] on 07/21/2022.  Kimberly states she is planning to see Dr. Einar Pheasant as her PCP.

## 2022-07-14 NOTE — Telephone Encounter (Signed)
Can you please call.  She established care with me relatively recently.  It appears she is also still seeing her previous primary care physician.  He just ordered labs, so fasting lab appt here needs to be canceled.  Need to know if she is planning to continue to see her PCP.

## 2022-07-19 ENCOUNTER — Encounter: Payer: Self-pay | Admitting: Gastroenterology

## 2022-07-21 ENCOUNTER — Other Ambulatory Visit: Payer: Medicare HMO

## 2022-07-24 ENCOUNTER — Ambulatory Visit (INDEPENDENT_AMBULATORY_CARE_PROVIDER_SITE_OTHER): Payer: Medicare HMO | Admitting: Internal Medicine

## 2022-07-24 ENCOUNTER — Ambulatory Visit (INDEPENDENT_AMBULATORY_CARE_PROVIDER_SITE_OTHER): Payer: Medicare HMO

## 2022-07-24 ENCOUNTER — Encounter: Payer: Self-pay | Admitting: Internal Medicine

## 2022-07-24 VITALS — BP 132/70 | HR 73 | Temp 97.9°F | Ht <= 58 in | Wt 149.2 lb

## 2022-07-24 DIAGNOSIS — M79671 Pain in right foot: Secondary | ICD-10-CM

## 2022-07-24 DIAGNOSIS — R739 Hyperglycemia, unspecified: Secondary | ICD-10-CM

## 2022-07-24 DIAGNOSIS — I10 Essential (primary) hypertension: Secondary | ICD-10-CM | POA: Diagnosis not present

## 2022-07-24 DIAGNOSIS — I700 Atherosclerosis of aorta: Secondary | ICD-10-CM

## 2022-07-24 DIAGNOSIS — Z23 Encounter for immunization: Secondary | ICD-10-CM | POA: Diagnosis not present

## 2022-07-24 DIAGNOSIS — E7800 Pure hypercholesterolemia, unspecified: Secondary | ICD-10-CM

## 2022-07-24 DIAGNOSIS — Z860100 Personal history of colon polyps, unspecified: Secondary | ICD-10-CM

## 2022-07-24 DIAGNOSIS — G2581 Restless legs syndrome: Secondary | ICD-10-CM

## 2022-07-24 DIAGNOSIS — Z8249 Family history of ischemic heart disease and other diseases of the circulatory system: Secondary | ICD-10-CM

## 2022-07-24 NOTE — Progress Notes (Signed)
Patient ID: [name], female   DOB: July 03, 1946, 76 y.o.   MRN: [id number]   Subjective:    Patient ID: [name], female    DOB: Aug 14, 1946, 76 y.o.   MRN: [id number]   Patient here for  Chief Complaint  Patient presents with   Follow-up    10 week follow up   .   HPI Here to follow up regarding her blood pressure and cholesterol.  She has been seeing Dr Richarda Overlie - her PCP for RLS and other medication issues.  Recent visit - 07/12/22 - found pt was taken 16m of ropinirole.  He is decreasing her down and recommended to change to mirapex.  Discussed with her today.  Reiterated recommended maxium dose of medication.  Discussed the need to decrease dose and discussed change in medication.  [facility]qual[facility] Code[facility]  No chest pain currently.  Breathing stable.  Discussed again CT calcium score and findings.  Had previously requested appt with Dr GRockey Situ Discussed today.  No abdominal pain or bowel issue reported.     Past Medical History:  Diagnosis Date   Allergy    Arthritis    Asthma    H/O exercise stress test    at 89years age   Heart murmur    Hyperlipidemia    Hypertension    Jaundice    In High school   Jaundice    hx    Past Surgical History:  Procedure Laterality Date   COLONOSCOPY WITH PROPOFOL N/A 06/18/2017   Procedure: COLONOSCOPY WITH PROPOFOL;  Surgeon: WLucilla Lame MD;  [place]  Service: Endoscopy;  Laterality: N/A;   ESOPHAGOGASTRODUODENOSCOPY (EGD) WITH PROPOFOL N/A 06/18/2017   Procedure: ESOPHAGOGASTRODUODENOSCOPY (EGD) WITH PROPOFOL;  Surgeon: WLucilla Lame MD;  [place]  Service: Endoscopy;  Laterality: N/A;   ESOPHAGOGASTRODUODENOSCOPY (EGD) WITH PROPOFOL N/A 10/05/2017   Procedure: ESOPHAGOGASTRODUODENOSCOPY (EGD) WITH PROPOFOL;  Surgeon: WLucilla Lame MD;  [place]  Service: [place]oscopy;  Laterality: N/A;   PARTIAL HYSTERECTOMY      POLYPECTOMY  06/18/2017   Procedure: POLYPECTOMY;  Surgeon: WLucilla Lame MD;  [place]  Service: Endoscopy;;   TUBAL LIGATION  10/30/1978   VAGINAL HYSTERECTOMY     Still has ovaries 08/2021.   Family History  Problem Relation Age of Onset   Heart attack Mother    Heart disease Mother 615  Heart attack Father    Heart disease Father 748  Diabetes Sister    Heart disease Brother    Heart disease Brother    Diabetes Maternal Grandmother    Breast cancer Other    Colon cancer Neg Hx    Social History   Socioeconomic History   Marital status: Married    Spouse name: Not on file   Number of children: 3   Years of education: Not on file   Highest education level: Not on file  Occupational History   Occupation: Self Employed - Hairdresser  Tobacco Use   Smoking status: Never   Smokeless tobacco: Never  Vaping Use   Vaping Use: Never used  Substance and Sexual Activity   Alcohol use: Yes    Alcohol/week: 7.0 standard drinks of alcohol    Types: 7 Glasses of wine per week    Comment: Wine nightly   Drug use: No   Sexual activity: Not on file  Other Topics Concern   Not on  file  Social History Narrative   Lives in [place]. Works at [employer] in [place]      Daily Caffeine Use:  2 coffee   Regular Exercise -  NO            Social Determinants of Health   Financial Resource Strain: Low Risk  (05/22/2022)   Overall Financial Resource Strain (CARDIA)    Difficulty of Paying Living Expenses: Not hard at all  Food Insecurity: No Food Insecurity (05/22/2022)   Hunger Vital Sign    Worried About Running Out of Food in the Last Year: Never true    Ran Out of Food in the Last Year: Never true  Transportation Needs: No Transportation Needs (05/22/2022)   PRAPARE - Hydrologist (Medical): No    Lack of Transportation (Non-Medical): No  Physical Activity: Not on file  Stress: No Stress Concern Present (05/22/2022)   [place]    Feeling of Stress : Not at all  Social Connections: Unknown (05/22/2022)   Social Connection and Isolation Panel [NHANES]    Frequency of Communication with Friends and Family: More than three times a week    Frequency of Social Gatherings with Friends and Family: More than three times a week    Attends Religious Services: Not on Advertising copywriter or Organizations: Not on file    Attends Archivist Meetings: Not on file    Marital Status: Married     Review of Systems  Constitutional:  Negative for appetite change and unexpected weight change.  HENT:  Negative for congestion and sinus pressure.   Respiratory:  Negative for cough and chest tightness.        Breathing stable.   Cardiovascular:  Negative for chest pain, palpitations and leg swelling.  Gastrointestinal:  Negative for abdominal pain, diarrhea, nausea and vomiting.  Genitourinary:  Negative for difficulty urinating and dysuria.  Musculoskeletal:  Negative for joint swelling and myalgias.       Right foot pain as outlined.   Skin:  Negative for color change and rash.  Neurological:  Negative for dizziness, light-headedness and headaches.  Psychiatric/Behavioral:  Negative for agitation and dysphoric mood.        Objective:     BP 132/70 (BP Location: Left Arm, Patient Position: Sitting, Cuff Size: Normal)   Pulse 73   Temp 97.9 F (36.6 C) (Oral)   Ht _0  (1.473 m)   Wt 149 lb 3.2 oz (67.7 kg)   SpO2 94%   BMI 31.18 kg/m  Wt Readings from Last 3 Encounters:  07/24/22 149 lb 3.2 oz (67.7 kg)  05/22/22 147 lb (66.7 kg)  05/03/22 147 lb 6.4 oz (66.9 kg)    Physical Exam Vitals reviewed.  Constitutional:      General: She is not in acute distress.    Appearance: Normal appearance.  HENT:     Head: Normocephalic and atraumatic.     Right Ear: External ear normal.     Left Ear: External ear normal.  Eyes:      General: No scleral icterus.       Right eye: No discharge.        Left eye: No discharge.     Conjunctiva/sclera: Conjunctivae normal.  Neck:     Thyroid: No thyromegaly.  Cardiovascular:     Rate and Rhythm: Normal rate and regular rhythm.  Pulmonary:  Effort: No respiratory distress.     Breath sounds: Normal breath sounds. No wheezing.  Abdominal:     General: Bowel sounds are normal.     Palpations: Abdomen is soft.     Tenderness: There is no abdominal tenderness.  Musculoskeletal:        General: No swelling or tenderness.     Cervical back: Neck supple. No tenderness.     Comments: Pain with palpation - top of right foot.   Lymphadenopathy:     Cervical: No cervical adenopathy.  Skin:    Findings: No erythema or rash.  Neurological:     Mental Status: She is alert.  Psychiatric:        Mood and Affect: Mood normal.        Behavior: Behavior normal.      Outpatient Encounter Medications as of 07/24/2022  Medication Sig   amLODipine (NORVASC) 5 MG tablet Take 1 tablet (5 mg total) by mouth daily.   atorvastatin (LIPITOR) 20 MG tablet Take 1 tablet (20 mg total) by mouth daily.   fexofenadine-pseudoephedrine (ALLEGRA-D ALLERGY & CONGESTION) 180-240 MG 24 hr tablet Take 1 tablet by mouth daily.   fluticasone (FLONASE) 50 MCG/ACT nasal spray Place into the nose.   ibuprofen (ADVIL) 100 MG tablet Take 100 mg by mouth every 6 (six) hours as needed for fever. Taking as needed   nystatin cream (MYCOSTATIN) Apply 1 Application topically 2 (two) times daily.   rOPINIRole (REQUIP) 3 MG tablet Take 1 tablet (3 mg total) by mouth 3 (three) times daily as needed.   [DISCONTINUED] esomeprazole (NEXIUM) 40 MG capsule    No facility-administered encounter medications on file as of 07/24/2022.     Lab Results  Component Value Date   WBC 9.9 03/13/2022   HGB 13.8 03/13/2022   HCT 40.6 03/13/2022   PLT 255.0 03/13/2022   GLUCOSE 119 (H) 03/13/2022   CHOL 199 03/13/2022    TRIG 64.0 03/13/2022   HDL 67.30 03/13/2022   LDLDIRECT 158.6 02/24/2013   LDLCALC 119 (H) 03/13/2022   ALT 26 05/03/2022   AST 22 05/03/2022   NA 137 03/13/2022   K 4.1 03/13/2022   CL 105 03/13/2022   CREATININE 0.45 03/13/2022   BUN 20 03/13/2022   CO2 25 03/13/2022   TSH 1.03 03/13/2022   HGBA1C 5.8 03/13/2022   MICROALBUR 1.2 06/29/2014    CT CARDIAC SCORING  Addendum Date: 05/15/2022   ADDENDUM REPORT: 05/15/2022 16:40 EXAM: OVER-READ INTERPRETATION  CT CHEST The following report is an over-read performed by radiologist Dr. Norlene Duel [facility] on 05/15/2022. This over-read does not include interpretation of cardiac or coronary anatomy or pathology. The coronary calcium score interpretation by the cardiologist is attached. COMPARISON:  11/25/2018 FINDINGS: Vascular: No acute abnormality. Mediastinum/nodes: No mass or adenopathy identified. Lungs/pleura: No pleural effusion, airspace consolidation or pneumothorax. Calcified granuloma identified in the right middle lobe. No suspicious pulmonary nodule or mass identified. Upper abdomen: No acute abnormality. Musculoskeletal: Thoracolumbar scoliosis deformity is noted with multilevel degenerative disc disease. No acute or suspicious osseous findings. IMPRESSION: 1. No active cardiopulmonary abnormalities. 2. Scoliosis and degenerative disc disease. Electronically Signed   By: Kerby Moors M.D.   On: 05/15/2022 16:40   Result Date: 05/15/2022 CLINICAL DATA:  Cardiovascular Disease Risk stratification EXAM: Coronary Calcium Score TECHNIQUE: A gated, non-contrast computed tomography scan of the heart was performed using 59m slice thickness. Axial images were analyzed on a dedicated workstation. Calcium scoring of the coronary  arteries was performed using the Agatston method. FINDINGS: Coronary Calcium Score: Left main: 0 Left anterior descending artery: 199 Left circumflex artery: 3.5 Right coronary artery: 96 Total: 299  Percentile: 80th Pericardium: Normal. Non-cardiac: See separate report from [facility]. IMPRESSION: Coronary calcium score of 299. This was 80th percentile for age-, race-, and sex-matched controls. RECOMMENDATIONS: Coronary artery calcium (CAC) score is a strong predictor of incident coronary heart disease (CHD) and provides predictive information beyond traditional risk factors. CAC scoring is reasonable to use in the decision to withhold, postpone, or initiate statin therapy in intermediate-risk or selected borderline-risk asymptomatic adults (age 65-75 years and LDL-C >=70 to <190 mg/dL) who do not have diabetes or established atherosclerotic cardiovascular disease (ASCVD).* In intermediate-risk (10-year ASCVD risk >=7.5% to <20%) adults or selected borderline-risk (10-year ASCVD risk >=5% to <7.5%) adults in whom a CAC score is measured for the purpose of making a treatment decision the following recommendations have been made: If CAC=0, it is reasonable to withhold statin therapy and reassess in 5 to 10 years, as long as higher risk conditions are absent (diabetes mellitus, family history of premature CHD in first degree relatives (males <55 years; females <65 years), cigarette smoking, or LDL >=190 mg/dL). If CAC is 1 to 99, it is reasonable to initiate statin therapy for patients >=42 years of age. If CAC is >=100 or >=75th percentile, it is reasonable to initiate statin therapy at any age. Cardiology referral should be considered for patients with CAC scores >=400 or >=75th percentile. *2018 AHA/ACC/AACVPR/AAPA/ABC/ACPM/ADA/AGS/APhA/ASPC/NLA/PCNA Guideline on the Management of Blood Cholesterol: A Report of the American College of Cardiology/American Heart Association Task Force on Clinical Practice Guidelines. J Am Coll Cardiol. 2019;73(24):3168-3209. Eleonore Chiquito, MD Electronically Signed: By: Eleonore Chiquito M.D. On: 05/15/2022 16:26       Assessment & Plan:   Problem List Items Addressed  This Visit     Aortic atherosclerosis ([place])    Continue lipitor.       Family history of ischemic heart disease and other diseases of the circulatory system    Had calcium score - 299 (80th percentile for age).  Discussed risk factor modification.  Request referral to Dr Rockey Situ.       Relevant Orders   Ambulatory referral to Cardiology   History of colon polyps    Had colonoscopy 05/2017 - polyps removed (tubular adenoma).  Recommended f/u colonoscopy in 5 years.       Hyperglycemia    Low carb diet and exercise.  Follow met b and a1c.       Relevant Orders   Hemoglobin A1c   Hypertension - Primary    Blood pressure as outlined.  Continue amlodipine.  Follow pressures.  Follow metabolic panel.       Relevant Orders   Basic metabolic panel   Pure hypercholesterolemia    Taking lipitor.  Low cholesterol diet and exercise.  Follow lipid panel and liver function tests.        Relevant Orders   Hepatic function panel   Lipid panel   Restless leg syndrome    Taking requip.  Has titrated herself up to 42m per day.  Currently being prescribed by Dr LNetty Starring  He has instructed her to taper off and start mirapex.  Request referral to Dr RJacqualine Code- [place]       Relevant Orders   Ambulatory referral to Neurology   Right foot pain    Persistent right foot pain.  Check  xray.  Further w/up pending results.  May need podiatry referral.       Relevant Orders   DG Foot Complete Right (Completed)   Other Visit Diagnoses     Need for immunization against influenza       Relevant Orders   Flu Vaccine QUAD High Dose(Fluad) (Completed)        Einar Pheasant, MD

## 2022-07-26 ENCOUNTER — Other Ambulatory Visit: Payer: Self-pay | Admitting: Internal Medicine

## 2022-07-26 DIAGNOSIS — M79671 Pain in right foot: Secondary | ICD-10-CM

## 2022-07-26 NOTE — Progress Notes (Signed)
Order placed for podiatry referral.   

## 2022-07-30 ENCOUNTER — Encounter: Payer: Self-pay | Admitting: Internal Medicine

## 2022-07-30 NOTE — Assessment & Plan Note (Signed)
Blood pressure as outlined.  Continue amlodipine.   Follow pressures.  Follow metabolic panel.  

## 2022-07-30 NOTE — Assessment & Plan Note (Signed)
Had colonoscopy 05/2017 - polyps removed (tubular adenoma).  Recommended f/u colonoscopy in 5 years.

## 2022-07-30 NOTE — Assessment & Plan Note (Signed)
Low carb diet and exercise.  Follow met b and a1c.  

## 2022-07-30 NOTE — Assessment & Plan Note (Signed)
Persistent right foot pain.  Check xray.  Further w/up pending results.  May need podiatry referral.

## 2022-07-30 NOTE — Assessment & Plan Note (Signed)
Had calcium score - 299 (80th percentile for age).  Discussed risk factor modification.  Request referral to Dr Rockey Situ.

## 2022-07-30 NOTE — Assessment & Plan Note (Signed)
Continue lipitor  ?

## 2022-07-30 NOTE — Assessment & Plan Note (Signed)
Taking lipitor.  Low cholesterol diet and exercise.  Follow lipid panel and liver function tests.

## 2022-07-30 NOTE — Assessment & Plan Note (Signed)
Taking requip.  Has titrated herself up to '9mg'$  per day.  Currently being prescribed by Dr Netty Starring.  He has instructed her to taper off and start mirapex.  Request referral to Dr Jacqualine Code - [place].

## 2022-07-31 ENCOUNTER — Ambulatory Visit: Payer: Medicare HMO | Admitting: Anesthesiology

## 2022-07-31 ENCOUNTER — Encounter: Payer: Self-pay | Admitting: Gastroenterology

## 2022-07-31 ENCOUNTER — Ambulatory Visit
Admission: RE | Admit: 2022-07-31 | Discharge: 2022-07-31 | Disposition: A | Discharge status: Home / Self Care | Payer: Medicare HMO | Attending: Gastroenterology | Admitting: Gastroenterology

## 2022-07-31 ENCOUNTER — Other Ambulatory Visit: Payer: Self-pay

## 2022-07-31 ENCOUNTER — Encounter
Admission: RE | Disposition: A | Discharge status: Home / Self Care | Payer: Self-pay | Source: Home / Self Care | Attending: Gastroenterology

## 2022-07-31 DIAGNOSIS — K635 Polyp of colon: Secondary | ICD-10-CM | POA: Diagnosis not present

## 2022-07-31 DIAGNOSIS — D124 Benign neoplasm of descending colon: Secondary | ICD-10-CM | POA: Insufficient documentation

## 2022-07-31 DIAGNOSIS — Z86010 Personal history of colonic polyps: Secondary | ICD-10-CM | POA: Diagnosis not present

## 2022-07-31 DIAGNOSIS — I10 Essential (primary) hypertension: Secondary | ICD-10-CM | POA: Insufficient documentation

## 2022-07-31 DIAGNOSIS — K219 Gastro-esophageal reflux disease without esophagitis: Secondary | ICD-10-CM | POA: Insufficient documentation

## 2022-07-31 DIAGNOSIS — Z1211 Encounter for screening for malignant neoplasm of colon: Secondary | ICD-10-CM | POA: Insufficient documentation

## 2022-07-31 HISTORY — PX: POLYPECTOMY: SHX5525

## 2022-07-31 HISTORY — PX: COLONOSCOPY WITH PROPOFOL: SHX5780

## 2022-07-31 SURGERY — COLONOSCOPY WITH PROPOFOL
Anesthesia: Monitor Anesthesia Care | Site: Rectum

## 2022-07-31 MED ORDER — LACTATED RINGERS IV SOLN: INTRAVENOUS | Status: DC

## 2022-07-31 MED ORDER — PROPOFOL 10 MG/ML IV BOLUS
INTRAVENOUS | Status: DC | PRN
  Administered 2022-07-31: 20 mg via INTRAVENOUS
  Administered 2022-07-31 (×2): 25 mg via INTRAVENOUS
  Administered 2022-07-31: 20 mg via INTRAVENOUS
  Administered 2022-07-31: 100 mg via INTRAVENOUS
  Administered 2022-07-31 (×2): 25 mg via INTRAVENOUS

## 2022-07-31 MED ORDER — EPHEDRINE SULFATE (PRESSORS) 50 MG/ML IJ SOLN
INTRAMUSCULAR | Status: DC | PRN
  Administered 2022-07-31 (×2): 10 mg via INTRAVENOUS

## 2022-07-31 MED ORDER — LIDOCAINE HCL (CARDIAC) PF 100 MG/5ML IV SOSY
PREFILLED_SYRINGE | INTRAVENOUS | Status: DC | PRN
  Administered 2022-07-31: 100 mg via INTRAVENOUS

## 2022-07-31 MED ORDER — SODIUM CHLORIDE 0.9 % IV SOLN: INTRAVENOUS | Status: DC

## 2022-07-31 MED ORDER — STERILE WATER FOR IRRIGATION IR SOLN
Status: DC | PRN
  Administered 2022-07-31: 200 mL

## 2022-07-31 MED ORDER — GLYCOPYRROLATE 0.2 MG/ML IJ SOLN
INTRAMUSCULAR | Status: DC | PRN
  Administered 2022-07-31 (×2): .2 mg via INTRAVENOUS

## 2022-07-31 SURGICAL SUPPLY — 8 items
GOWN CVR UNV OPN BCK APRN NK (MISCELLANEOUS) ×4 IMPLANT
GOWN ISOL THUMB LOOP REG UNIV (MISCELLANEOUS) ×4
KIT PRC NS LF DISP ENDO (KITS) ×2 IMPLANT
KIT PROCEDURE OLYMPUS (KITS) ×2
MANIFOLD NEPTUNE II (INSTRUMENTS) ×2 IMPLANT
SNARE COLD EXACTO (MISCELLANEOUS) IMPLANT
TRAP ETRAP POLY (MISCELLANEOUS) IMPLANT
WATER STERILE IRR 250ML POUR (IV SOLUTION) ×2 IMPLANT

## 2022-07-31 NOTE — Op Note (Signed)
[facility]enterology Patient Name: [name] Procedure Date: 07/31/2022 9:38 AM MRN: [id number] Account #: [id number] Date of Birth: [birth year] Admit Type: Outpatient Age: 76 Room: [facility] OR ROOM 01 Gender: Female Note Status: Finalized Instrument Name: [id number] Procedure:             Colonoscopy Indications:           High risk colon cancer surveillance: Personal history                         of colonic polyps Providers:             Lucilla Lame MD, MD Referring MD:          Einar Pheasant, MD (Referring MD) Medicines:             Propofol per Anesthesia Complications:         No immediate complications. Procedure:             Pre-Anesthesia Assessment:                        - Prior to the procedure, a History and Physical was                         performed, and patient medications and allergies were                         reviewed. The patient's tolerance of previous                         anesthesia was also reviewed. The risks and benefits                         of the procedure and the sedation options and risks                         were discussed with the patient. All questions were                         answered, and informed consent was obtained. Prior                         Anticoagulants: The patient has taken no previous                         anticoagulant or antiplatelet agents. ASA Grade                         Assessment: II - A patient with mild systemic disease.                         After reviewing the risks and benefits, the patient                         was deemed in satisfactory condition to undergo the                         procedure.  After obtaining informed consent, the colonoscope was                         passed under direct vision. Throughout the procedure,                         the patient's blood pressure, pulse, and oxygen                         saturations were monitored  continuously. The                         Colonoscope was introduced through the anus and                         advanced to the the cecum, identified by appendiceal                         orifice and ileocecal valve. The colonoscopy was                         performed without difficulty. The patient tolerated                         the procedure well. The quality of the bowel                         preparation was excellent. Findings:      The perianal and digital rectal examinations were normal.      Two sessile polyps were found in the descending colon. The polyps were 3       to 5 mm in size. These polyps were removed with a cold snare. Resection       and retrieval were complete.      A 3 mm polyp was found in the sigmoid colon. The polyp was sessile. The       polyp was removed with a cold snare. Resection and retrieval were       complete.      Multiple small-mouthed diverticula were found in the sigmoid colon.      Non-bleeding internal hemorrhoids were found during retroflexion. The       hemorrhoids were Grade I (internal hemorrhoids that do not prolapse). Impression:            - Two 3 to 5 mm polyps in the descending colon,                         removed with a cold snare. Resected and retrieved.                        - One 3 mm polyp in the sigmoid colon, removed with a                         cold snare. Resected and retrieved.                        - Diverticulosis in the sigmoid colon.                        -  Non-bleeding internal hemorrhoids. Recommendation:        - Discharge patient to home.                        - Resume previous diet.                        - Continue present medications.                        - Await pathology results.                        - Repeat colonoscopy is not recommended for                         surveillance. Procedure Code(s):     --- Professional ---                        [phone], Colonoscopy, flexible; with removal of                          tumor(s), polyp(s), or other lesion(s) by snare                         technique Diagnosis Code(s):     --- Professional ---                        Z86.010, Personal history of colonic polyps                        K63.5, Polyp of colon CPT copyright 2019 American Medical Association. All rights reserved. The codes documented in this report are preliminary and upon coder review may  be revised to meet current compliance requirements. Lucilla Lame MD, MD 07/31/2022 10:06:03 AM This report has been signed electronically. Number of Addenda: 0 Note Initiated On: 07/31/2022 9:38 AM Scope Withdrawal Time: 0 hours 10 minutes 2 seconds  Total Procedure Duration: 0 hours 20 minutes 44 seconds  Estimated Blood Loss:  Estimated blood loss: none.      [facility]

## 2022-07-31 NOTE — Transfer of Care (Signed)
Immediate Anesthesia Transfer of Care Note  Patient: [name] [facility]  Procedure(s) Performed: COLONOSCOPY WITH PROPOFOL (Rectum) POLYPECTOMY (Rectum)  Patient Location: PACU  Anesthesia Type: MAC  Level of Consciousness: awake, alert  and patient cooperative  Airway and Oxygen Therapy: Patient Spontanous Breathing and Patient connected to supplemental oxygen  Post-op Assessment: Post-op Vital signs reviewed, Patient's Cardiovascular Status Stable, Respiratory Function Stable, Patent Airway and No signs of Nausea or vomiting  Post-op Vital Signs: Reviewed and stable  Complications: No notable events documented.

## 2022-07-31 NOTE — Anesthesia Preprocedure Evaluation (Signed)
Anesthesia Evaluation  Patient identified by MRN, date of birth, ID band Patient awake    Reviewed: Allergy & Precautions, NPO status , Patient's Chart, lab work & pertinent test results  History of Anesthesia Complications Negative for: history of anesthetic complications  Airway Mallampati: III  TM Distance: <3 FB Neck ROM: full    Dental  (+) Chipped   Pulmonary neg shortness of breath, asthma ,    Pulmonary exam normal        Cardiovascular Exercise Tolerance: Good hypertension, (-) anginaNormal cardiovascular exam     Neuro/Psych  Headaches,  Neuromuscular disease negative psych ROS   GI/Hepatic Neg liver ROS, PUD, GERD  Controlled,  Endo/Other  negative endocrine ROS  Renal/GU negative Renal ROS  negative genitourinary   Musculoskeletal   Abdominal   Peds  Hematology negative hematology ROS (+)   Anesthesia Other Findings Past Medical History: No date: Allergy No date: Arthritis No date: Asthma No date: H/O exercise stress test     Comment:  at 64 years age No date: Heart murmur No date: Hyperlipidemia No date: Hypertension No date: Jaundice     Comment:  In High school No date: Jaundice     Comment:  hx   Past Surgical History: 06/18/2017: COLONOSCOPY WITH PROPOFOL; N/A     Comment:  Procedure: COLONOSCOPY WITH PROPOFOL;  Surgeon: Lucilla Lame, MD;  Location: [place];  Service:               Endoscopy;  Laterality: N/A; 06/18/2017: ESOPHAGOGASTRODUODENOSCOPY (EGD) WITH PROPOFOL; N/A     Comment:  Procedure: ESOPHAGOGASTRODUODENOSCOPY (EGD) WITH               PROPOFOL;  Surgeon: Lucilla Lame, MD;  Location: [place];  Service: Endoscopy;  Laterality: N/A; 10/05/2017: ESOPHAGOGASTRODUODENOSCOPY (EGD) WITH PROPOFOL; N/A     Comment:  Procedure: ESOPHAGOGASTRODUODENOSCOPY (EGD) WITH               PROPOFOL;  Surgeon: Lucilla Lame, MD;  Location:  [place];  Service: Endoscopy;  Laterality: N/A; No date: PARTIAL HYSTERECTOMY 06/18/2017: POLYPECTOMY     Comment:  Procedure: POLYPECTOMY;  Surgeon: Lucilla Lame, MD;                Location: [place];  Service: Endoscopy;; 10/30/1978: TUBAL LIGATION No date: VAGINAL HYSTERECTOMY     Comment:  Still has ovaries 08/2021.  BMI    Body Mass Index: 30.31 kg/m      Reproductive/Obstetrics negative OB ROS                             Anesthesia Physical Anesthesia Plan  ASA: 3  Anesthesia Plan: General   Post-op Pain Management:    Induction: Intravenous  PONV Risk Score and Plan: Propofol infusion and TIVA  Airway Management Planned: Natural Airway and Nasal Cannula  Additional Equipment:   Intra-op Plan:   Post-operative Plan:   Informed Consent: I have reviewed the patients History and Physical, chart, labs and discussed the procedure including the risks, benefits and alternatives for the proposed anesthesia with the patient or authorized representative who has indicated his/her understanding and acceptance.     Dental Advisory  Given  Plan Discussed with: Anesthesiologist, CRNA and Surgeon  Anesthesia Plan Comments: (Patient consented for risks of anesthesia including but not limited to:  - adverse reactions to medications - risk of airway placement if required - damage to eyes, teeth, lips or other oral mucosa - nerve damage due to positioning  - sore throat or hoarseness - Damage to heart, brain, nerves, lungs, other parts of body or loss of life  Patient voiced understanding.)        Anesthesia Quick Evaluation

## 2022-07-31 NOTE — H&P (Signed)
Kimberly Lame, MD [address] [place], [place] [address] Phone:[phone] Fax : [phone]  Primary Care Physician:  Kimberly Pheasant, MD Primary Gastroenterologist:  Dr. Allen Schaefer  Pre-Procedure History & Physical: HPI:  [name] is a 76 y.o. female is here for an colonoscopy.   Past Medical History:  Diagnosis Date   Allergy    Arthritis    Asthma    H/O exercise stress test    at 20 years age   Heart murmur    Hyperlipidemia    Hypertension    Jaundice    In High school   Jaundice    hx     Past Surgical History:  Procedure Laterality Date   COLONOSCOPY WITH PROPOFOL N/A 06/18/2017   Procedure: COLONOSCOPY WITH PROPOFOL;  Surgeon: Kimberly Lame, MD;  Location: [facility];  Service: Endoscopy;  Laterality: N/A;   ESOPHAGOGASTRODUODENOSCOPY (EGD) WITH PROPOFOL N/A 06/18/2017   Procedure: ESOPHAGOGASTRODUODENOSCOPY (EGD) WITH PROPOFOL;  Surgeon: Kimberly Lame, MD;  Location: [facility];  Service: Endoscopy;  Laterality: N/A;   ESOPHAGOGASTRODUODENOSCOPY (EGD) WITH PROPOFOL N/A 10/05/2017   Procedure: ESOPHAGOGASTRODUODENOSCOPY (EGD) WITH PROPOFOL;  Surgeon: Kimberly Lame, MD;  Location: [facility];  Service: Endoscopy;  Laterality: N/A;   PARTIAL HYSTERECTOMY     POLYPECTOMY  06/18/2017   Procedure: POLYPECTOMY;  Surgeon: Kimberly Lame, MD;  Location: [facility];  Service: Endoscopy;;   TUBAL LIGATION  10/30/1978   VAGINAL HYSTERECTOMY     Still has ovaries 08/2021.    Prior to Admission medications   Medication Sig Start Date End Date Taking? Authorizing Provider  amLODipine (NORVASC) 5 MG tablet Take 1 tablet (5 mg total) by mouth daily. 09/05/21  Yes Gollan, Kimberly November, MD  atorvastatin (LIPITOR) 20 MG tablet Take 1 tablet (20 mg total) by mouth daily. 09/05/21  Yes Gollan, Kimberly November, MD  fexofenadine-pseudoephedrine (ALLEGRA-D ALLERGY & CONGESTION) 180-240 MG 24 hr tablet Take 1 tablet by mouth daily. 08/30/15   Yes Kimberly Confer, MD  fluticasone (FLONASE) 50 MCG/ACT nasal spray Place into the nose. 09/28/14  Yes [provider]  ibuprofen (ADVIL) 100 MG tablet Take 100 mg by mouth every 6 (six) hours as needed for fever. Taking as needed   Yes [provider]  nystatin cream (MYCOSTATIN) Apply 1 Application topically 2 (two) times daily. 05/03/22  Yes Kimberly Pheasant, MD  rOPINIRole (REQUIP) 3 MG tablet Take 1 tablet (3 mg total) by mouth 3 (three) times daily as needed. 05/10/20  Yes Ronnell Freshwater, NP  esomeprazole ([name]) 40 MG capsule  12/29/16 01/25/21  [provider]    Allergies as of 05/05/2022 - Review Complete 05/03/2022  Allergen Reaction Noted   Codeine Nausea Only 02/24/2013   Penicillins Swelling and Rash 10/05/2011    Family History  Problem Relation Age of Onset   Heart attack Mother    Heart disease Mother 45   Heart attack Father    Heart disease Father 30   Diabetes Sister    Heart disease Brother    Heart disease Brother    Diabetes Maternal Grandmother    Breast cancer Other    Colon cancer Neg Hx     Social History   Socioeconomic History   Marital status: Married    Spouse name: Not on file   Number of children: 3   Years of education: Not on file   Highest education level: Not on file  Occupational History   Occupation: Self  Employed - Hairdresser  Tobacco Use   Smoking status: Never   Smokeless tobacco: Never  Vaping Use   Vaping Use: Never used  Substance and Sexual Activity   Alcohol use: Yes    Alcohol/week: 7.0 standard drinks of alcohol    Types: 7 Glasses of wine per week    Comment: Wine nightly   Drug use: No   Sexual activity: Not on file  Other Topics Concern   Not on file  Social History Narrative   Lives in [place]. Works at [employer] in [place]      Daily Caffeine Use:  2 coffee   Regular Exercise -  NO            Social Determinants of Health   Financial Resource Strain: Low Risk   (05/22/2022)   Overall Financial Resource Strain (CARDIA)    Difficulty of Paying Living Expenses: Not hard at all  Food Insecurity: No Food Insecurity (05/22/2022)   Hunger Vital Sign    Worried About Running Out of Food in the Last Year: Never true    Ran Out of Food in the Last Year: Never true  Transportation Needs: No Transportation Needs (05/22/2022)   PRAPARE - Hydrologist (Medical): No    Lack of Transportation (Non-Medical): No  Physical Activity: Not on file  Stress: No Stress Concern Present (05/22/2022)   [place]    Feeling of Stress : Not at all  Social Connections: Unknown (05/22/2022)   Social Connection and Isolation Panel [NHANES]    Frequency of Communication with Friends and Family: More than three times a week    Frequency of Social Gatherings with Friends and Family: More than three times a week    Attends Religious Services: Not on file    Active Member of [place] or Organizations: Not on file    Attends Archivist Meetings: Not on file    Marital Status: Married  Intimate Partner Violence: Not At Risk (05/22/2022)   Humiliation, Afraid, Rape, and Kick questionnaire    Fear of Current or Ex-Partner: No    Emotionally Abused: No    Physically Abused: No    Sexually Abused: No    Review of Systems: See HPI, otherwise negative ROS  Physical Exam: BP (!) 140/70   Pulse 78   Temp 98.1 F (36.7 C) (Temporal)   Resp 16   Ht '4\' 10"'$  (1.473 m)   Wt 65.8 kg   SpO2 97%   BMI 30.31 kg/m  General:   Alert,  pleasant and cooperative in NAD Head:  Normocephalic and atraumatic. Neck:  Supple; no masses or thyromegaly. Lungs:  Clear throughout to auscultation.    Heart:  Regular rate and rhythm. Abdomen:  Soft, nontender and nondistended. Normal bowel sounds, without guarding, and without rebound.   Neurologic:  Alert and  oriented x4;  grossly normal  neurologically.  Impression/Plan: LORA [name] is here for an colonoscopy to be performed for a history of adenomatous polyps in 2018   Risks, benefits, limitations, and alternatives regarding  colonoscopy have been reviewed with the patient.  Questions have been answered.  All parties agreeable.   Kimberly Lame, MD  07/31/2022, 8:58 AM

## 2022-07-31 NOTE — Anesthesia Postprocedure Evaluation (Signed)
Anesthesia Post Note  Patient: Dora Simeone [facility]  Procedure(s) Performed: COLONOSCOPY WITH PROPOFOL (Rectum) POLYPECTOMY (Rectum)  Patient location during evaluation: Endoscopy Anesthesia Type: MAC Level of consciousness: awake and alert Pain management: pain level controlled Vital Signs Assessment: post-procedure vital signs reviewed and stable Respiratory status: spontaneous breathing, nonlabored ventilation, respiratory function stable and patient connected to nasal cannula oxygen Cardiovascular status: blood pressure returned to baseline and stable Postop Assessment: no apparent nausea or vomiting Anesthetic complications: no   No notable events documented.   Last Vitals:  Vitals:   07/31/22 1007 07/31/22 1015  BP: (!) 124/58 (!) 109/42  Pulse: 93 88  Resp: 14 17  Temp: (!) 36.3 C (!) 36.3 C  SpO2: 95% 95%    Last Pain:  Vitals:   07/31/22 1015  TempSrc:   PainSc: 0-No pain                 Precious Haws Matalynn Graff

## 2022-08-01 ENCOUNTER — Encounter: Payer: Self-pay | Admitting: Internal Medicine

## 2022-08-01 ENCOUNTER — Encounter: Payer: Self-pay | Admitting: Gastroenterology

## 2022-08-01 DIAGNOSIS — Z0000 Encounter for general adult medical examination without abnormal findings: Secondary | ICD-10-CM | POA: Insufficient documentation

## 2022-08-01 LAB — SURGICAL PATHOLOGY

## 2022-08-02 ENCOUNTER — Encounter: Payer: Self-pay | Admitting: Gastroenterology

## 2022-08-07 ENCOUNTER — Ambulatory Visit: Payer: Medicare HMO | Admitting: Podiatry

## 2022-08-21 ENCOUNTER — Ambulatory Visit: Payer: Medicare HMO | Admitting: Medical

## 2022-08-30 ENCOUNTER — Other Ambulatory Visit: Payer: Self-pay | Admitting: Cardiovascular Disease

## 2022-08-30 DIAGNOSIS — I10 Essential (primary) hypertension: Secondary | ICD-10-CM

## 2022-09-25 ENCOUNTER — Other Ambulatory Visit: Payer: Self-pay | Admitting: Cardiovascular Disease

## 2022-09-25 DIAGNOSIS — I10 Essential (primary) hypertension: Secondary | ICD-10-CM

## 2022-10-16 NOTE — Progress Notes (Unsigned)
Cardiology Office Note  Date:  10/17/2022   ID:  Verity, [place]crest 1946/09/08, MRN [id number]  PCP:  Einar Pheasant, MD   Chief Complaint  Patient presents with   Follow up calcium core     "Doing well." Medications reviewed by the patient verbally.     HPI:  [name] is a 76 year old woman with past medical history of HTN GERD Non smoker,  Calcium score January 2020 of 123 In 7/23: Coronary calcium score of 299.  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    moderate left ventricular hypertrophy with Septal hypertrophy.   diastolic dysfunction mild MR, TR and AR.  mild pulmonary artery hypertension.  carotid ultrasound done through primary care showing mild buildup, results not available  Denies any significant shortness of breath or chest discomfort on exertion  PMH:   has a past medical history of Allergy, Arthritis, Asthma, H/O exercise stress test, Heart murmur, Hyperlipidemia, Hypertension, Jaundice, and Jaundice.  PSH:    Past Surgical History:  Procedure Laterality Date   COLONOSCOPY WITH PROPOFOL N/A 06/18/2017   Procedure: COLONOSCOPY WITH PROPOFOL;  Surgeon: Lucilla Lame, MD;  Location: [facility];  Service: Endoscopy;   Laterality: N/A;   COLONOSCOPY WITH PROPOFOL N/A 07/31/2022   Procedure: COLONOSCOPY WITH PROPOFOL;  Surgeon: Lucilla Lame, MD;  Location: [facility];  Service: Endoscopy;  Laterality: N/A;   ESOPHAGOGASTRODUODENOSCOPY (EGD) WITH PROPOFOL N/A 06/18/2017   Procedure: ESOPHAGOGASTRODUODENOSCOPY (EGD) WITH PROPOFOL;  Surgeon: Lucilla Lame, MD;  Location: [facility];  Service: Endoscopy;  Laterality: N/A;   ESOPHAGOGASTRODUODENOSCOPY (EGD) WITH PROPOFOL N/A 10/05/2017   Procedure: ESOPHAGOGASTRODUODENOSCOPY (EGD) WITH PROPOFOL;  Surgeon: Lucilla Lame, MD;  Location: [facility];  Service: Endoscopy;  Laterality: N/A;   PARTIAL HYSTERECTOMY     POLYPECTOMY  06/18/2017   Procedure: POLYPECTOMY;  Surgeon: Lucilla Lame, MD;  Location: [facility];  Service: Endoscopy;;   POLYPECTOMY  07/31/2022   Procedure: POLYPECTOMY;  Surgeon: Lucilla Lame, MD;  Location: [facility];  Service: Endoscopy;;   TUBAL LIGATION  10/30/1978   VAGINAL HYSTERECTOMY     Still has ovaries 08/2021.    Current Outpatient Medications  Medication Sig Dispense Refill   alendronate (FOSAMAX) 70 MG tablet TAKE 1 TABLET BY MOUTH EVERY 7 DAYS TAKE WITH A FULL GLASS OF WATER. DO NOT LIE DOWN FOR 30 MIN     amLODipine (NORVASC) 5 MG tablet TAKE 1 TABLET (5 MG TOTAL) BY MOUTH DAILY. 30 tablet 0   atorvastatin (LIPITOR) 20 MG tablet TAKE 1 TABLET BY MOUTH EVERY DAY 30 tablet 0   fexofenadine-pseudoephedrine (ALLEGRA-D ALLERGY & CONGESTION) 180-240 MG 24 hr tablet Take 1 tablet by mouth daily. 30 tablet 5   fluticasone (FLONASE) 50 MCG/ACT nasal spray Place into the nose.  ibuprofen (ADVIL) 100 MG tablet Take 100 mg by mouth every 6 (six) hours as needed for fever. Taking as needed     nystatin cream (MYCOSTATIN) Apply 1 Application topically 2 (two) times daily. 30 g 0   rOPINIRole (REQUIP) 3 MG tablet Take 1 tablet (3 mg total) by mouth 3 (three) times daily as needed. 90 tablet 3   No  current facility-administered medications for this visit.     Allergies:   Codeine and Penicillins   Social History:  The patient  reports that she has never smoked. She has never used smokeless tobacco. She reports current alcohol use of about 7.0 standard drinks of alcohol per week. She reports that she does not use drugs.   Family History:   family history includes Breast cancer in an other family member; Diabetes in her maternal grandmother and sister; Heart attack in her father and mother; Heart disease in her brother and brother; Heart disease (age of onset: 8) in her mother; Heart disease (age of onset: 46) in her father.    Review of Systems: Review of Systems  Constitutional: Negative.   Respiratory: Negative.    Cardiovascular: Negative.   Gastrointestinal: Negative.   Musculoskeletal: Negative.   Neurological: Negative.   Psychiatric/Behavioral: Negative.    All other systems reviewed and are negative.   PHYSICAL EXAM: VS:  BP 120/70 (BP Location: Right Arm, Patient Position: Sitting, Cuff Size: Normal)   Pulse 82   Ht '4\' 11"'$  (1.499 m)   Wt 150 lb 2 oz (68.1 kg)   SpO2 94%   BMI 30.32 kg/m  , BMI Body mass index is 30.32 kg/m. Constitutional:  oriented to person, place, and time. No distress.  HENT:  Head: Grossly normal Eyes:  no discharge. No scleral icterus.  Neck: No JVD, no carotid bruits  Cardiovascular: Regular rate and rhythm, no murmurs appreciated Pulmonary/Chest: Clear to auscultation bilaterally, no wheezes or rails Abdominal: Soft.  no distension.  no tenderness.  Musculoskeletal: Normal range of motion Neurological:  normal muscle tone. Coordination normal. No atrophy Skin: Skin warm and dry Psychiatric: normal affect, pleasant  Recent Labs: 03/13/2022: BUN 20; Creatinine, Ser 0.45; Hemoglobin 13.8; Platelets 255.0; Potassium 4.1; Sodium 137; TSH 1.03 05/03/2022: ALT 26    Lipid Panel Lab Results  Component Value Date   CHOL 199 03/13/2022    HDL 67.30 03/13/2022   LDLCALC 119 (H) 03/13/2022   TRIG 64.0 03/13/2022      Wt Readings from Last 3 Encounters:  10/17/22 150 lb 2 oz (68.1 kg)  07/31/22 145 lb (65.8 kg)  07/24/22 149 lb 3.2 oz (67.7 kg)     ASSESSMENT AND PLAN:  Essential hypertension - Blood pressure is well controlled on today's visit. No changes made to the medications.  Coronary calcification Calcium score from 120 now close to 300 Recommend she take her Lipitor in the morning as she  forgets evening dosing We will also add Zetia to achieve goal LDL less than 70 Denies anginal symptoms  Abnormal echocardiogram Normal LV function with LVH per prior outside echocardiogram No significant murmur, benign EKG, No significant LVH noted on CT scan chest   Total encounter time more than 30 minutes  Greater than 50% was spent in cunseling and coordination of care with the patient    No orders of the defined types were placed in this encounter.    Signed, Esmond Plants, M.D., Ph.D. 10/17/2022  [place], [place]

## 2022-10-17 ENCOUNTER — Ambulatory Visit: Payer: Medicare HMO | Attending: Medical | Admitting: Cardiovascular Disease

## 2022-10-17 ENCOUNTER — Encounter: Payer: Self-pay | Admitting: Cardiovascular Disease

## 2022-10-17 VITALS — BP 120/70 | HR 82 | Ht 59.0 in | Wt 150.1 lb

## 2022-10-17 DIAGNOSIS — I700 Atherosclerosis of aorta: Secondary | ICD-10-CM | POA: Diagnosis not present

## 2022-10-17 DIAGNOSIS — I2510 Atherosclerotic heart disease of native coronary artery without angina pectoris: Secondary | ICD-10-CM

## 2022-10-17 DIAGNOSIS — E782 Mixed hyperlipidemia: Secondary | ICD-10-CM | POA: Diagnosis not present

## 2022-10-17 DIAGNOSIS — I10 Essential (primary) hypertension: Secondary | ICD-10-CM

## 2022-10-17 MED ORDER — AMLODIPINE BESYLATE 5 MG PO TABS: 5.0000 mg | ORAL_TABLET | Freq: Every day | ORAL | 3 refills | Status: DC

## 2022-10-17 MED ORDER — ATORVASTATIN CALCIUM 20 MG PO TABS: 20.0000 mg | ORAL_TABLET | Freq: Every day | ORAL | 3 refills | Status: DC

## 2022-10-17 MED ORDER — EZETIMIBE 10 MG PO TABS: 10.0000 mg | ORAL_TABLET | Freq: Every day | ORAL | 3 refills | Status: DC

## 2022-10-17 NOTE — Patient Instructions (Addendum)
Medication Instructions:  Your physician has recommended you make the following change in your medication:   -Please start Zetia 10 mg daily.  -Take the Lipitor (Atorvastatin) 20 mg tablet in the Am.  If you need a refill on your cardiac medications before your next appointment, please call your pharmacy.   Lab work: No new labs needed  Testing/Procedures: No new testing needed  Follow-Up: At [facility], you and your health needs are our priority.  As part of our continuing mission to provide you with exceptional heart care, we have created designated Provider Care Teams.  These Care Teams include your primary Cardiologist (physician) and Advanced Practice Providers (APPs -  Physician Assistants and Nurse Practitioners) who all work together to provide you with the care you need, when you need it.  You will need a follow up appointment in 12 months  Providers on your designated Care Team:   Murray Hodgkins, NP Christell Faith, PA-C Cadence Kathlen Mody, [place]  COVID-19 Vaccine Information can be found at: [email or web address].uk For questions related to vaccine distribution or appointments, please email [email or web address] or call [phone].

## 2022-10-26 ENCOUNTER — Ambulatory Visit
Admission: EM | Admit: 2022-10-26 | Discharge: 2022-10-26 | Disposition: A | Discharge status: Home / Self Care | Payer: Medicare HMO

## 2022-10-26 ENCOUNTER — Ambulatory Visit (INDEPENDENT_AMBULATORY_CARE_PROVIDER_SITE_OTHER): Payer: Medicare HMO

## 2022-10-26 DIAGNOSIS — R062 Wheezing: Secondary | ICD-10-CM | POA: Diagnosis not present

## 2022-10-26 DIAGNOSIS — R6889 Other general symptoms and signs: Secondary | ICD-10-CM | POA: Diagnosis not present

## 2022-10-26 DIAGNOSIS — J45909 Unspecified asthma, uncomplicated: Secondary | ICD-10-CM | POA: Diagnosis not present

## 2022-10-26 MED ORDER — PREDNISONE 20 MG PO TABS: 60.0000 mg | ORAL_TABLET | Freq: Every day | ORAL | 0 refills | Status: AC

## 2022-10-26 MED ORDER — LEVOFLOXACIN 750 MG PO TABS: 750.0000 mg | ORAL_TABLET | Freq: Every day | ORAL | 0 refills | Status: AC

## 2022-10-26 NOTE — ED Triage Notes (Signed)
Pt. Presents to UC w/ c/o congestion, headache, emesis and fatigue for the past 2 days.

## 2022-10-26 NOTE — Discharge Instructions (Addendum)
For signs and symptoms of worsening respiratory status.  These may include feeling of shortness of breath and "I cannot catch my breath" or acute confusion.  If these occur please go to the ED for evaluation.  Follow up here or with your primary care provider if your symptoms are worsening or not improving.

## 2022-10-26 NOTE — ED Provider Notes (Signed)
Roderic Palau    CSN: [id number] Arrival date & time: 10/26/22  2426      History   Chief Complaint Chief Complaint  Patient presents with   Nasal Congestion   Emesis   Fatigue    HPI [name] is a 76 y.o. female.    Emesis   Complains of congestion, headache, emesis, fatigue x 2-3 days.  Presents with fever of 101.8 F.  SpO2 of 90%.  No history of chronic respiratory disease.  Past Medical History:  Diagnosis Date   Allergy    Arthritis    Asthma    H/O exercise stress test    at 7 years age   Heart murmur    Hyperlipidemia    Hypertension    Jaundice    In High school   Jaundice    hx     Patient Active Problem List   Diagnosis Date Noted   Healthcare maintenance 08/01/2022   History of colonic polyps    Right foot pain 07/24/2022   Aortic atherosclerosis ([place]) 05/04/2022   Abnormal CXR 05/03/2022   History of colon polyps 05/03/2022   Rash 05/03/2022   Hyperglycemia 03/13/2022   Abdominal pain 03/13/2022   Encounter for screening mammogram for malignant neoplasm of breast 05/03/2020   Non-ossified fibroma of bone 01/20/2020   Glenohumeral arthritis, left 12/26/2019   Primary osteoarthritis of left knee 12/26/2019   Encounter for general adult medical examination with abnormal findings 11/16/2019   Essential hypertension 11/16/2019   Concentration deficit 11/16/2019   Dysuria 11/16/2019   Primary osteoarthritis of left hip 05/28/2019   Uterovaginal prolapse, incomplete 04/28/2019   Abnormal echocardiogram 11/11/2018   Chronic left shoulder pain 04/08/2018   Left hip pain 04/08/2018   Fatigue 04/08/2018   Abnormal weight gain 04/08/2018   Vitamin D deficiency 04/08/2018   Polyp of sigmoid colon 12/08/2017   Esophageal leukoplakia    Heartburn    Gastric ulcer    Reflux esophagitis    Special screening for malignant neoplasms, colon    Benign neoplasm of cecum    Benign neoplasm of ascending colon    Gastroesophageal  reflux disease    Low back pain 09/25/2016   Family history of ischemic heart disease and other diseases of the circulatory system 08/30/2015   Ophthalmoplegic migraine, not intractable 12/28/2014   Hypertension    Bunion, right foot 02/24/2013   Allergic rhinitis 06/24/2012   Restless leg syndrome 11/13/2011   Mixed hyperlipidemia 10/05/2011   Pure hypercholesterolemia 10/05/2011    Past Surgical History:  Procedure Laterality Date   COLONOSCOPY WITH PROPOFOL N/A 06/18/2017   Procedure: COLONOSCOPY WITH PROPOFOL;  Surgeon: Lucilla Lame, MD;  Location: [place];  Service: Endoscopy;  Laterality: N/A;   COLONOSCOPY WITH PROPOFOL N/A 07/31/2022   Procedure: COLONOSCOPY WITH PROPOFOL;  Surgeon: Lucilla Lame, MD;  Location: [place];  Service: Endoscopy;  Laterality: N/A;   ESOPHAGOGASTRODUODENOSCOPY (EGD) WITH PROPOFOL N/A 06/18/2017   Procedure: ESOPHAGOGASTRODUODENOSCOPY (EGD) WITH PROPOFOL;  Surgeon: Lucilla Lame, MD;  Location: [place];  Service: Endoscopy;  Laterality: N/A;   ESOPHAGOGASTRODUODENOSCOPY (EGD) WITH PROPOFOL N/A 10/05/2017   Procedure: ESOPHAGOGASTRODUODENOSCOPY (EGD) WITH PROPOFOL;  Surgeon: Lucilla Lame, MD;  Location: [place];  Service: Endoscopy;  Laterality: N/A;   PARTIAL HYSTERECTOMY     POLYPECTOMY  06/18/2017   Procedure: POLYPECTOMY;  Surgeon: Lucilla Lame, MD;  Location: [place];  Service: Endoscopy;;   POLYPECTOMY  07/31/2022   Procedure: POLYPECTOMY;  Surgeon: Lucilla Lame, MD;  Location: [place];  Service: Endoscopy;;   TUBAL LIGATION  10/30/1978   VAGINAL HYSTERECTOMY     Still has ovaries 08/2021.    OB History   No obstetric history on file.      Home Medications    Prior to Admission medications   Medication Sig Start Date End Date Taking? Authorizing Provider  aspirin EC 81 MG tablet Take by mouth. 09/25/22  Yes [provider]  GAVILYTE-G 236 g solution Take by  mouth. 07/11/22  Yes [provider]  Na Sulfate-K Sulfate-Mg Sulf 17.5-3.13-1.6 GM/177ML SOLN Take by mouth as directed. 06/30/22  Yes [provider]  alendronate (FOSAMAX) 70 MG tablet TAKE 1 TABLET BY MOUTH EVERY 7 DAYS TAKE WITH A FULL GLASS OF WATER. DO NOT LIE DOWN FOR 30 MIN 09/25/22   [provider]  amLODipine (NORVASC) 5 MG tablet Take 1 tablet (5 mg total) by mouth daily. 10/17/22   Minna Merritts, MD  atorvastatin (LIPITOR) 20 MG tablet Take 1 tablet (20 mg total) by mouth daily. 10/17/22   Minna Merritts, MD  ezetimibe (ZETIA) 10 MG tablet Take 1 tablet (10 mg total) by mouth daily. 10/17/22   Minna Merritts, MD  fexofenadine-pseudoephedrine (ALLEGRA-D ALLERGY & CONGESTION) 180-240 MG 24 hr tablet Take 1 tablet by mouth daily. 08/30/15   Jackolyn Confer, MD  fluticasone (FLONASE) 50 MCG/ACT nasal spray Place into the nose. 09/28/14   [provider]  ibuprofen (ADVIL) 100 MG tablet Take 100 mg by mouth every 6 (six) hours as needed for fever. Taking as needed    [provider]  nystatin cream (MYCOSTATIN) Apply 1 Application topically 2 (two) times daily. 05/03/22   Einar Pheasant, MD  rOPINIRole (REQUIP) 3 MG tablet Take 1 tablet (3 mg total) by mouth 3 (three) times daily as needed. 05/10/20   Ronnell Freshwater, NP  esomeprazole ([place]) 40 MG capsule  12/29/16 01/25/21  [provider]    Family History Family History  Problem Relation Age of Onset   Heart attack Mother    Heart disease Mother 50   Heart attack Father    Heart disease Father 20   Diabetes Sister    Heart disease Brother    Heart disease Brother    Diabetes Maternal Grandmother    Breast cancer Other    Colon cancer Neg Hx     Social History Social History   Tobacco Use   Smoking status: Never   Smokeless tobacco: Never  Vaping Use   Vaping Use: Never used  Substance Use Topics   Alcohol use: Yes    Alcohol/week: 7.0 standard drinks of  alcohol    Types: 7 Glasses of wine per week    Comment: Wine nightly   Drug use: No     Allergies   Codeine and Penicillins   Review of Systems Review of Systems  Gastrointestinal:  Positive for vomiting.     Physical Exam Triage Vital Signs ED Triage Vitals  Enc Vitals Group     BP 10/26/22 1001 (!) 156/83     Pulse Rate 10/26/22 1001 77     Resp 10/26/22 1001 18     Temp 10/26/22 1001 (!) 101.8 F (38.8 C)     Temp Source 10/26/22 1001 Oral     SpO2 10/26/22 1001 90 %     Weight --      Height --  Head Circumference --      Peak Flow --      Pain Score 10/26/22 1004 0     Pain Loc --      Pain Edu? --      Excl. in [place]? --    No data found.  Updated Vital Signs BP (!) 156/83 (BP Location: Left Arm)   Pulse 77   Temp (!) 101.8 F (38.8 C) (Oral)   Resp 18   SpO2 90%   Visual Acuity Right Eye Distance:   Left Eye Distance:   Bilateral Distance:    Right Eye Near:   Left Eye Near:    Bilateral Near:     Physical Exam Vitals reviewed.  Constitutional:      Appearance: Normal appearance. She is ill-appearing.  Cardiovascular:     Rate and Rhythm: Normal rate and regular rhythm.     Pulses: Normal pulses.  Pulmonary:     Effort: Pulmonary effort is normal.     Breath sounds: Wheezing and rhonchi present.  Skin:    General: Skin is warm and dry.  Neurological:     General: No focal deficit present.     Mental Status: She is alert and oriented to person, place, and time.  Psychiatric:        Mood and Affect: Mood normal.        Behavior: Behavior normal.      UC Treatments / Results  Labs (all labs ordered are listed, but only abnormal results are displayed) Labs Reviewed - No data to display  EKG   Radiology No results found.  Procedures Procedures (including critical care time)  Medications Ordered in UC Medications - No data to display  Initial Impression / Assessment and Plan / UC Course  I have reviewed the triage  vital signs and the nursing notes.  Pertinent labs & imaging results that were available during my care of the patient were reviewed by me and considered in my medical decision making (see chart for details).   Patient is febrile here without recent antipyretics. Satting well on room air. Overall is ill appearing, well hydrated, without respiratory distress. Pulmonary exam is remarkable for rhonchorous breath sounds.    Concern for CAP versus acute airway and will obtain chest x-ray which shows no evidence of acute cardiopulmonary disease.  Her symptoms are consistent with acute viral process including influenza.  She is unclear about the start of her symptoms and possibly outside the window of treatment with antiviral therapy.  I am Somewhat concerned about her SpO2 which was as low as 90% and as high as 93% on room air.  Will give a course of prednisone to try to reduce inflammation in her bronchioles causing the wheezing as well as treat her with some Levaquin (intolerance of penicillins).  Final Clinical Impressions(s) / UC Diagnoses   Final diagnoses:  None   Discharge Instructions   None    ED Prescriptions   None    PDMP not reviewed this encounter.   Rose Phi, [place] 10/26/22 1049

## 2022-10-31 ENCOUNTER — Telehealth: Payer: Self-pay

## 2022-10-31 NOTE — Telephone Encounter (Signed)
Patient called clinic stating that she was seen 12/28 and no improvement with the mediations prescribed. I reviewed encounter and per notes patient came in for Flu like symptoms with 02 status 90-93%. Per discharge instructions was instructed to follow-up with PCP or ED if symptoms worsen. Patient reports SOB and no improvement after treatment with antibiotics and steroids. I instructed patient to call 911 or go to the nearest ED if she is experiencing any symptoms of respiratory distress. I informed patient if she did not have any s/s of respiratory distress she can return to [facility] for evaluation. Pt states she voiced understanding and states she is not going to the ED. She just needs a refill on the meds prescribed.

## 2022-11-02 ENCOUNTER — Other Ambulatory Visit: Payer: Medicare HMO

## 2022-11-06 ENCOUNTER — Encounter: Payer: Medicare HMO | Admitting: Internal Medicine

## 2023-01-25 ENCOUNTER — Telehealth: Payer: Self-pay | Admitting: Cardiovascular Disease

## 2023-01-25 NOTE — Telephone Encounter (Signed)
Pt c/o Shortness Of Breath: STAT if SOB developed within the last 24 hours or pt is noticeably SOB on the phone  1. Are you currently SOB (can you hear that pt is SOB on the phone)? No  2. How long have you been experiencing SOB? Off and on after surgery on the 13th  3. Are you SOB when sitting or when up moving around? Moving around  4. Are you currently experiencing any other symptoms? No  Pt went to her f/u yesterday from her shoulder surgery she had on 01/10/23. The doctor recommended that she let Dr. Rockey Situ aware that the pt has been experiencing SOB since the surgery on the 13th. Please advise

## 2023-01-25 NOTE — Telephone Encounter (Signed)
Patient was calling in due to some shortness of breath she has been having. This started after her shoulder surgery and the surgeon encouraged her to give [place] a call. She reports shortness of breath while walking, putting clothes on, and other activities. Inquired if she had any daily weights and she does not. She does report 3 pound weight gain over the last couple of weeks. Advised that I will send this over to Dr. Rockey Situ for his review and we would be in touch with his recommendations.

## 2023-01-26 NOTE — Telephone Encounter (Signed)
Left a message for the patient to call back.  Minna Merritts, MD  P C[facility] Div [place] Triage Caller: Unspecified (Yesterday,  9:18 AM) It is possible too much fluids were given during shoulder surgery Lab work looks okay Could try Lasix 20 mg with potassium 10 for 3 days in a row and see if this helps If it does help then would only take Lasix with potassium as needed Make sure not eating out much with high salt, decreased fluid intake If no improvement in symptoms may need repeat echo Thx TGollan

## 2023-01-29 NOTE — Telephone Encounter (Signed)
Call placed back to the patient. She stated that she was getting better and nothing further was needed. She has been advised that if the shortness of breath progresses we can give her some Furosemide, per Dr. Rockey Situ. She has verbalized her understanding.

## 2023-01-29 NOTE — Telephone Encounter (Signed)
Pt is following up on Fridays call regarding her sob. She would like a call back.

## 2023-03-16 ENCOUNTER — Telehealth: Payer: Self-pay | Admitting: Internal Medicine

## 2023-03-16 NOTE — Telephone Encounter (Signed)
Received notification - overdue mammogram.  Need to schedule.  Also does not have f/u scheduled. With me.

## 2023-03-21 NOTE — Telephone Encounter (Signed)
Per patient

## 2023-03-21 NOTE — Telephone Encounter (Signed)
Patient is no longer a [facility] patient.

## 2023-04-02 ENCOUNTER — Telehealth: Payer: Self-pay | Admitting: Cardiovascular Disease

## 2023-04-02 NOTE — Telephone Encounter (Signed)
Patient called wanting to know what her score was to her CT cardiac scoring test.

## 2023-04-02 NOTE — Telephone Encounter (Signed)
Spoke to patient and informed her that her coronary calcium score was  Total: 299   Percentile: 80th  If CAC is >=100 or >=75th percentile, it is reasonable to initiate statin therapy at any age.  Patient satisfied with response

## 2023-04-24 ENCOUNTER — Telehealth: Payer: Self-pay | Admitting: Internal Medicine

## 2023-04-24 NOTE — Telephone Encounter (Signed)
Judeth Cornfield from [facility] called in stating that she need to scheduled pt mammogram, so she called to verify pt phone number.

## 2023-05-17 ENCOUNTER — Telehealth: Payer: Self-pay | Admitting: Internal Medicine

## 2023-05-17 NOTE — Telephone Encounter (Signed)
Copied from CRM [id number]. Topic: Medicare AWV >> May 17, 2023  3:16 PM Payton Doughty wrote: Reason for CRM: LVM 05/17/23 to r/s [id number].  Please confirm if pcp is Dr Lorin Picket to r/s AWV  Verlee Rossetti; Care Guide Ambulatory Clinical Support [place] [name] Direct Dial: [phone]

## 2023-08-02 ENCOUNTER — Other Ambulatory Visit: Payer: Self-pay | Admitting: Cardiovascular Disease

## 2023-08-02 DIAGNOSIS — I10 Essential (primary) hypertension: Secondary | ICD-10-CM

## 2023-09-28 ENCOUNTER — Other Ambulatory Visit: Payer: Self-pay | Admitting: Cardiovascular Disease

## 2023-12-11 ENCOUNTER — Other Ambulatory Visit: Payer: Self-pay | Admitting: Cardiovascular Disease

## 2023-12-11 DIAGNOSIS — I10 Essential (primary) hypertension: Secondary | ICD-10-CM

## 2023-12-21 ENCOUNTER — Other Ambulatory Visit: Payer: Self-pay | Admitting: Cardiovascular Disease

## 2023-12-21 DIAGNOSIS — I10 Essential (primary) hypertension: Secondary | ICD-10-CM

## 2024-01-24 ENCOUNTER — Other Ambulatory Visit: Payer: Self-pay | Admitting: Cardiovascular Disease

## 2024-02-04 ENCOUNTER — Other Ambulatory Visit: Payer: Self-pay | Admitting: Cardiovascular Disease

## 2024-05-13 ENCOUNTER — Encounter: Attending: Physician Assistant | Admitting: Physician Assistant

## 2024-05-13 DIAGNOSIS — S80812A Abrasion, left lower leg, initial encounter: Secondary | ICD-10-CM | POA: Diagnosis present

## 2024-05-13 DIAGNOSIS — I10 Essential (primary) hypertension: Secondary | ICD-10-CM | POA: Insufficient documentation

## 2024-05-13 DIAGNOSIS — L97821 Non-pressure chronic ulcer of other part of left lower leg limited to breakdown of skin: Secondary | ICD-10-CM | POA: Diagnosis not present

## 2024-05-15 ENCOUNTER — Encounter: Payer: Self-pay | Admitting: Cardiovascular Disease

## 2024-05-19 ENCOUNTER — Ambulatory Visit: Admitting: Dermatology

## 2024-05-20 ENCOUNTER — Ambulatory Visit: Admitting: Physician Assistant

## 2024-05-20 DIAGNOSIS — S80812A Abrasion, left lower leg, initial encounter: Secondary | ICD-10-CM | POA: Diagnosis not present

## 2024-05-26 ENCOUNTER — Ambulatory Visit: Admitting: Physician Assistant

## 2024-05-27 ENCOUNTER — Encounter: Admitting: Physician Assistant

## 2024-05-27 DIAGNOSIS — S80812A Abrasion, left lower leg, initial encounter: Secondary | ICD-10-CM | POA: Diagnosis not present

## 2024-06-03 ENCOUNTER — Encounter: Attending: Physician Assistant | Admitting: Physician Assistant

## 2024-06-03 DIAGNOSIS — I10 Essential (primary) hypertension: Secondary | ICD-10-CM | POA: Diagnosis not present

## 2024-06-03 DIAGNOSIS — S80812A Abrasion, left lower leg, initial encounter: Secondary | ICD-10-CM | POA: Insufficient documentation

## 2024-06-03 DIAGNOSIS — L97821 Non-pressure chronic ulcer of other part of left lower leg limited to breakdown of skin: Secondary | ICD-10-CM | POA: Diagnosis not present

## 2024-06-03 DIAGNOSIS — X58XXXA Exposure to other specified factors, initial encounter: Secondary | ICD-10-CM | POA: Diagnosis not present

## 2024-06-09 ENCOUNTER — Encounter: Admitting: Physician Assistant

## 2024-06-09 ENCOUNTER — Ambulatory Visit: Admitting: Physician Assistant

## 2024-06-09 DIAGNOSIS — S80812A Abrasion, left lower leg, initial encounter: Secondary | ICD-10-CM | POA: Diagnosis not present

## 2024-06-16 ENCOUNTER — Ambulatory Visit: Admitting: Physician Assistant

## 2024-06-23 ENCOUNTER — Ambulatory Visit: Admitting: Internal Medicine

## 2024-06-25 ENCOUNTER — Encounter: Admitting: Internal Medicine

## 2024-06-25 DIAGNOSIS — S80812A Abrasion, left lower leg, initial encounter: Secondary | ICD-10-CM | POA: Diagnosis not present

## 2024-07-04 ENCOUNTER — Ambulatory Visit: Admitting: Physician Assistant

## 2024-07-07 ENCOUNTER — Encounter: Attending: Physician Assistant | Admitting: Physician Assistant

## 2024-07-07 DIAGNOSIS — I10 Essential (primary) hypertension: Secondary | ICD-10-CM | POA: Diagnosis not present

## 2024-07-07 DIAGNOSIS — L97821 Non-pressure chronic ulcer of other part of left lower leg limited to breakdown of skin: Secondary | ICD-10-CM | POA: Insufficient documentation

## 2024-07-07 DIAGNOSIS — S80812A Abrasion, left lower leg, initial encounter: Secondary | ICD-10-CM | POA: Insufficient documentation

## 2024-07-07 DIAGNOSIS — X58XXXA Exposure to other specified factors, initial encounter: Secondary | ICD-10-CM | POA: Diagnosis not present

## 2024-07-14 ENCOUNTER — Ambulatory Visit: Admitting: Physician Assistant

## 2024-07-21 ENCOUNTER — Ambulatory Visit: Admitting: Physician Assistant

## 2024-07-28 ENCOUNTER — Encounter: Admitting: Physician Assistant

## 2024-07-28 DIAGNOSIS — S80812A Abrasion, left lower leg, initial encounter: Secondary | ICD-10-CM | POA: Diagnosis not present

## 2024-08-11 ENCOUNTER — Ambulatory Visit: Admitting: Physician Assistant

## 2024-10-07 ENCOUNTER — Other Ambulatory Visit: Payer: Self-pay | Admitting: Family Medicine

## 2024-10-07 DIAGNOSIS — F0150 Vascular dementia without behavioral disturbance: Secondary | ICD-10-CM

## 2024-10-11 ENCOUNTER — Ambulatory Visit
Admission: RE | Admit: 2024-10-11 | Discharge: 2024-10-11 | Disposition: A | Source: Ambulatory Visit | Attending: Family Medicine | Admitting: Family Medicine

## 2024-10-11 DIAGNOSIS — F0150 Vascular dementia without behavioral disturbance: Secondary | ICD-10-CM

## 2024-10-13 ENCOUNTER — Ambulatory Visit

## 2024-10-14 ENCOUNTER — Other Ambulatory Visit
